# Patient Record
Sex: Female | Born: 1950
Health system: Southern US, Community
[De-identification: ages and names within clinical notes are randomized; demographics above are authoritative.]

## PROBLEM LIST (undated history)

## (undated) DIAGNOSIS — I219 Acute myocardial infarction, unspecified: Secondary | ICD-10-CM

## (undated) DIAGNOSIS — I82409 Acute embolism and thrombosis of unspecified deep veins of unspecified lower extremity: Secondary | ICD-10-CM

## (undated) DIAGNOSIS — I509 Heart failure, unspecified: Secondary | ICD-10-CM

## (undated) DIAGNOSIS — I10 Essential (primary) hypertension: Secondary | ICD-10-CM

## (undated) DIAGNOSIS — I4891 Unspecified atrial fibrillation: Secondary | ICD-10-CM

## (undated) HISTORY — DX: Acute embolism and thrombosis of unspecified deep veins of unspecified lower extremity: I82.409

## (undated) HISTORY — PX: CORONARY STENT PLACEMENT: SHX1402

---

## 2011-03-03 ENCOUNTER — Inpatient Hospital Stay: Payer: Self-pay | Admitting: Orthopedic Surgery

## 2011-03-03 IMAGING — CR RIGHT HIP - 1 VIEW
1 series · 1 of 1 positions shown · non-contrast
Comparison: none

REASON FOR EXAM: Evaluate for fracture post-reduction in PACU
COMMENTS:   Bedside (portable):Y

PROCEDURE:     DXR - DXR HIP RIGHT ONE VIEW  - [DATE]  [DATE]
RESULT:     Degenerative changes are noted about the right hip. Fracture of
the superior acetabulum appears to be present. Mild displacement noted.
Loose bodies cannot be excluded.

[view not recorded]
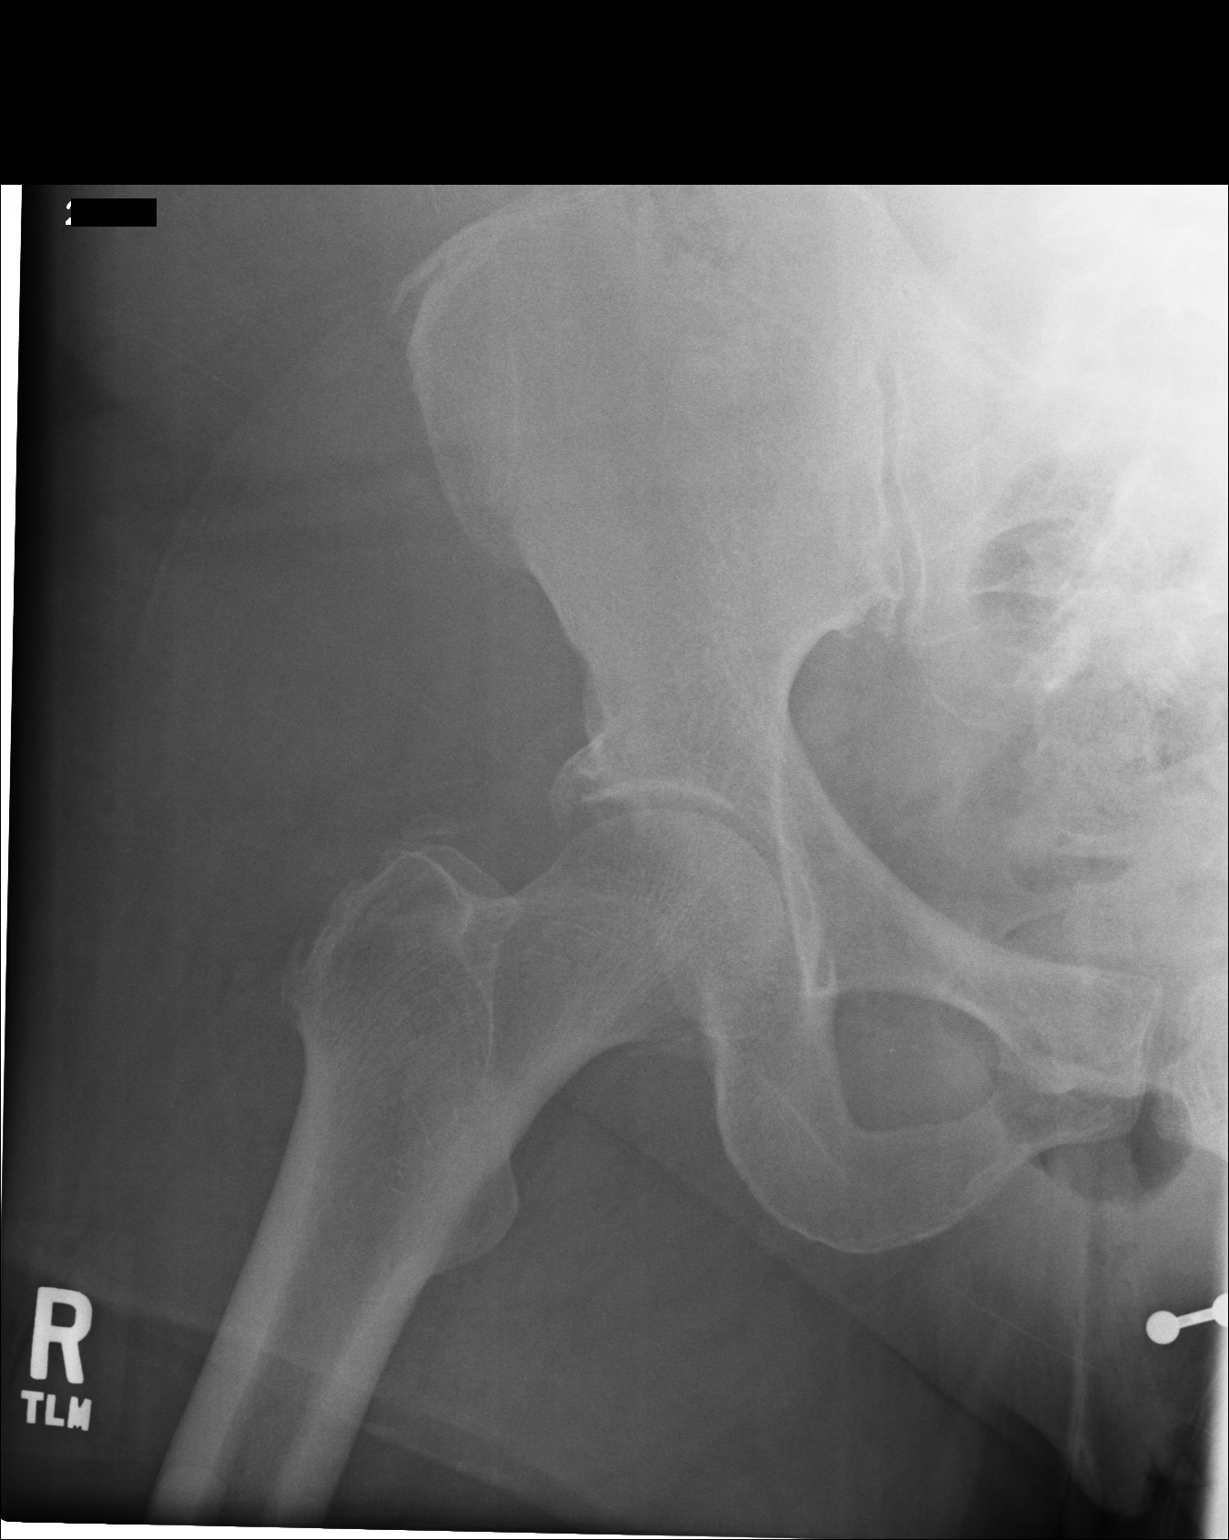

[1 of 1 positions shown; findings below may reference images not displayed]

IMPRESSION: Relocated right hip in AP projection. Minimally displaced
fracture of the superior acetabulum appears to be present. Loose bodies may
be present.

## 2011-03-03 IMAGING — CT CT ABDOMEN W/O CM
1 of 2 series · 15 of 32 positions shown, 20 images · non-contrast
Comparison: none

REASON FOR EXAM: evaluate abdominal pain in the setting of trauma
COMMENTS:

PROCEDURE:     CT  - CT ABDOMEN STANDARD WO  - [DATE]  [DATE]
RESULT:     History: Trauma.

[Series 2: 3mm soft tissue · axial · 0.88mm/px · z∈[-582,-304]mm · 15 of 105 slices shown, 20 images]
[im 6/105  soft-tissue]
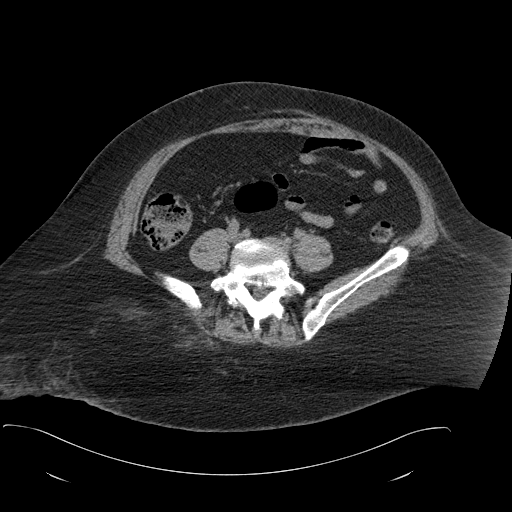
[im 6/105  bone]
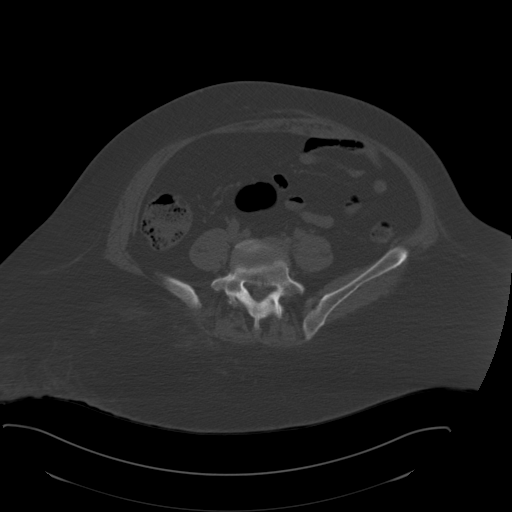
[im 11/105  soft-tissue]
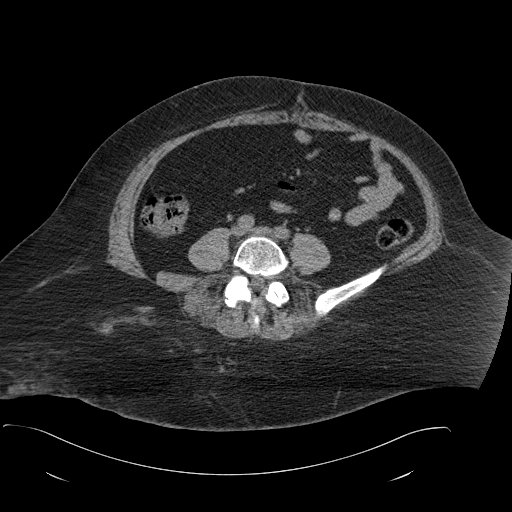
[im 22/105  soft-tissue]
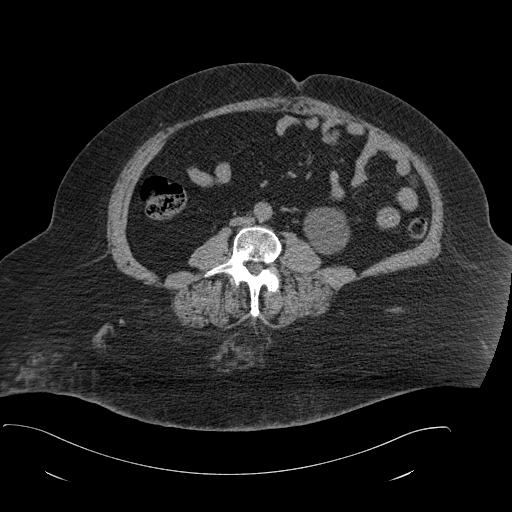
[im 28/105  soft-tissue]
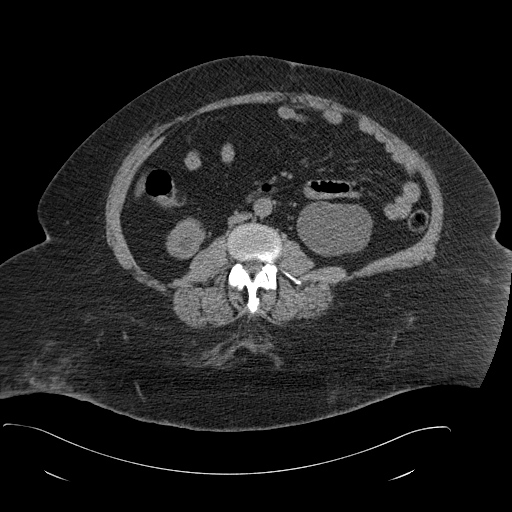
[im 33/105  soft-tissue]
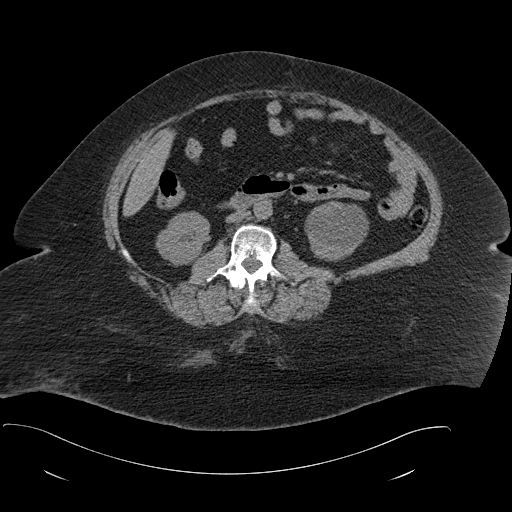
[im 44/105  soft-tissue]
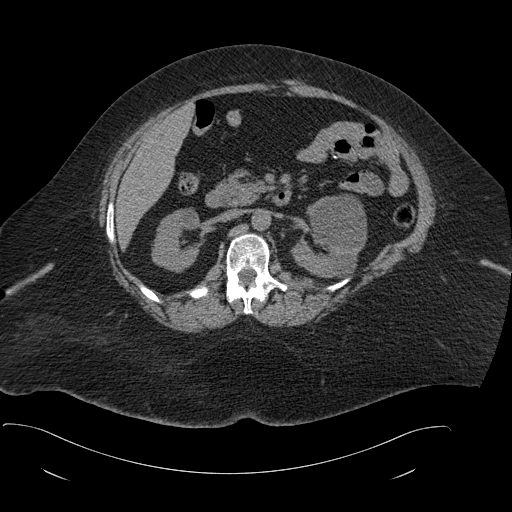
[im 50/105  soft-tissue]
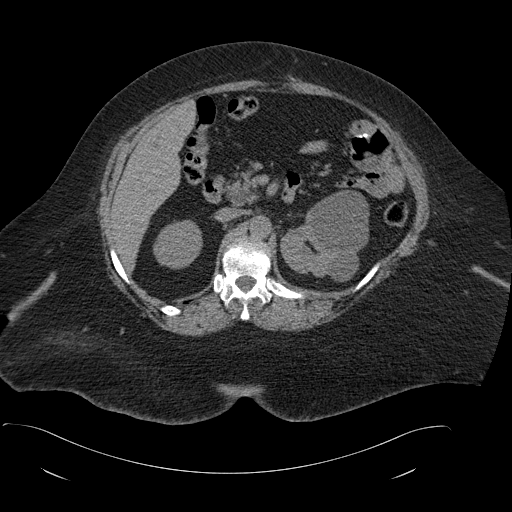
[im 55/105  soft-tissue]
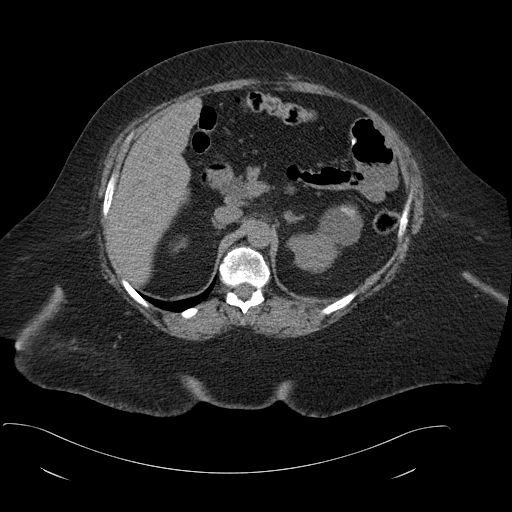
[im 61/105  soft-tissue]
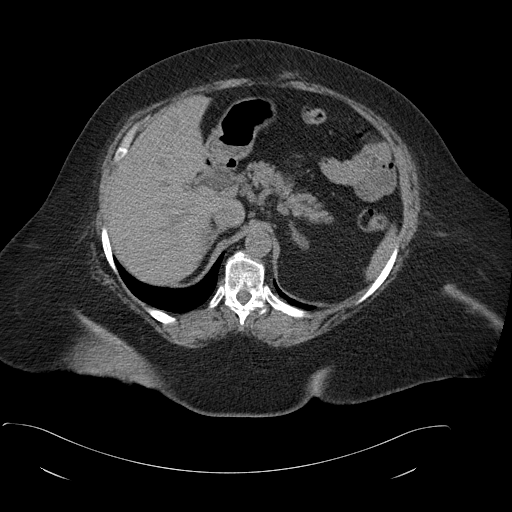
[im 61/105  bone]
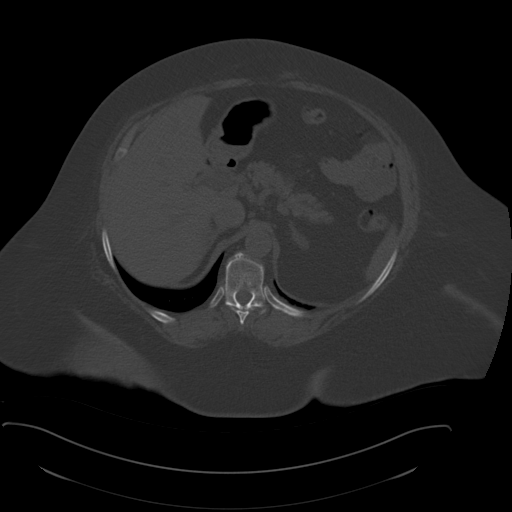
[im 72/105  soft-tissue]
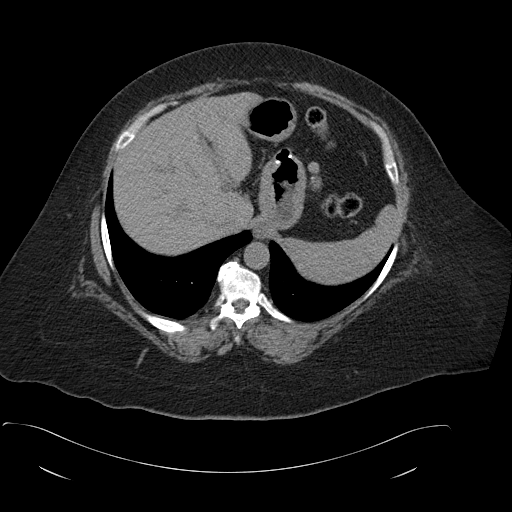
[im 77/105  soft-tissue]
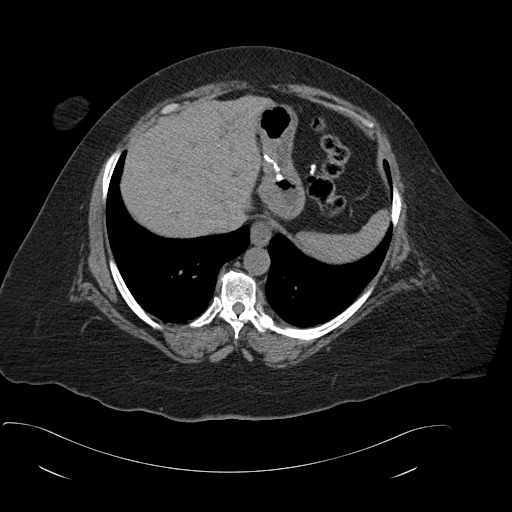
[im 83/105  soft-tissue]
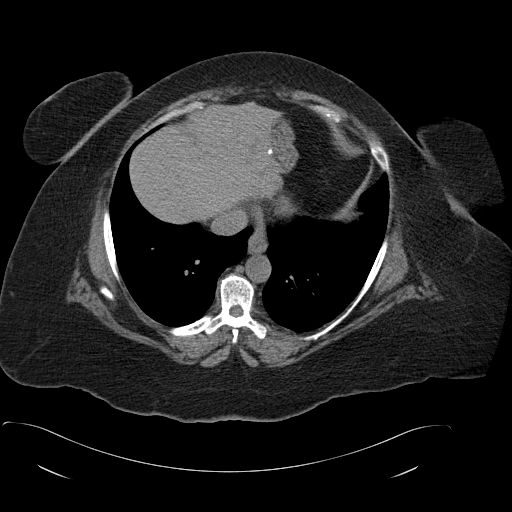
[im 83/105  lung]
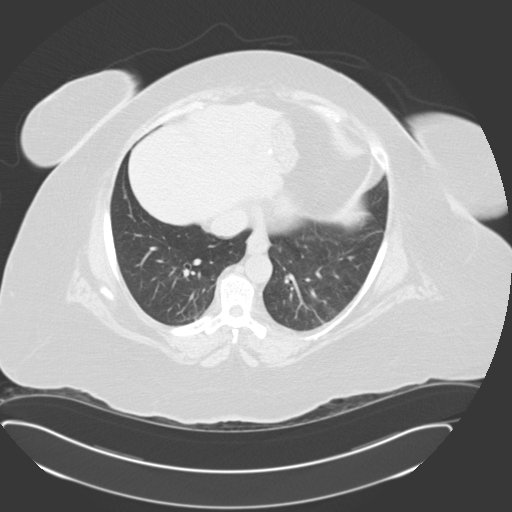
[im 88/105  lung]
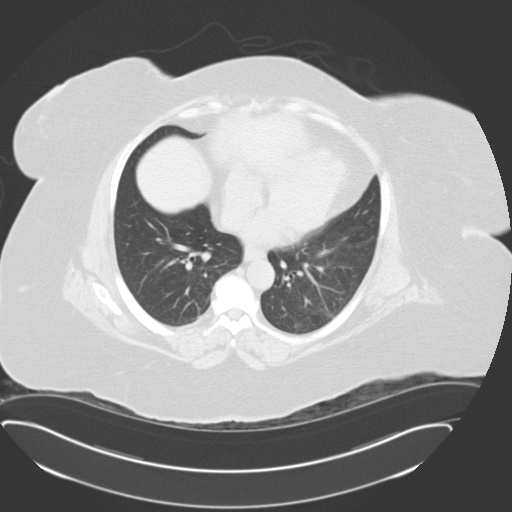
[im 94/105  soft-tissue]
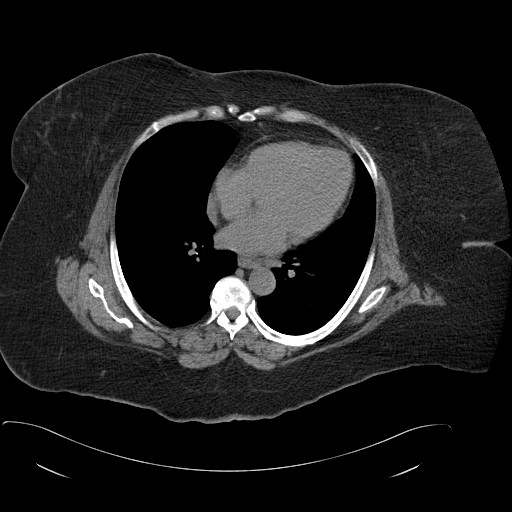
[im 94/105  lung]
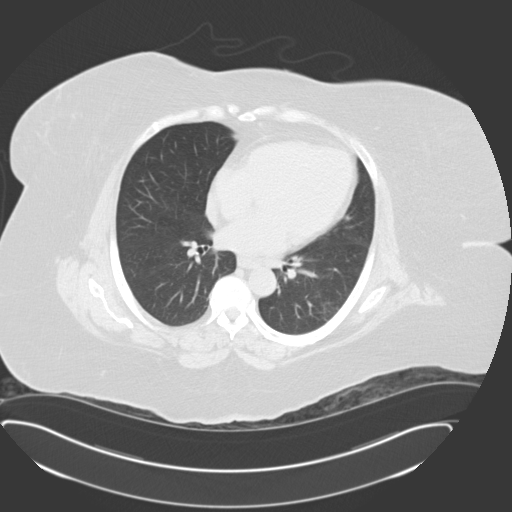
[im 99/105  soft-tissue]
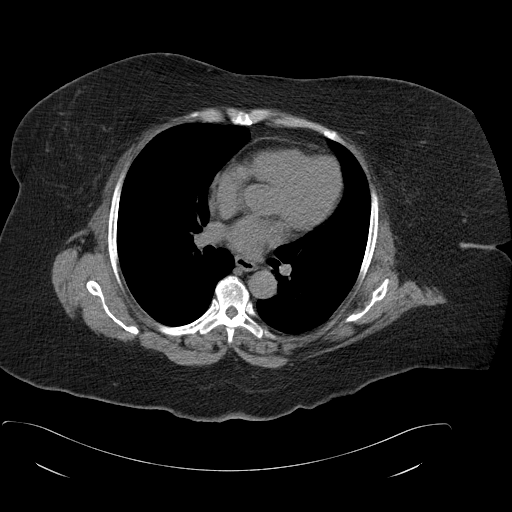
[im 99/105  lung]
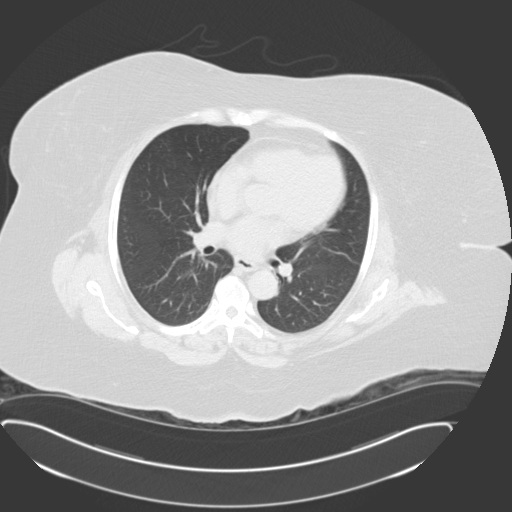

[15 of 32 positions shown; findings below may reference images not displayed]

FINDINGS: Standard nonenhanced CT obtained. Liver normal. Spleen normal.
Pancreas normal. Adrenals normal. Aorta nondistended. Multiple renal cyst
present. Soft tissue swelling noted of the right buttock region. Lung bases
clear.
IMPRESSION: Soft tissue swelling noted over the right buttock region.
No acute abnormality otherwise noted.

## 2011-03-03 IMAGING — CR PELVIS - 1-2 VIEW
1 series · 1 of 1 positions shown · non-contrast
Comparison: none

REASON FOR EXAM: fall injury
COMMENTS:

[t pelvis ap]
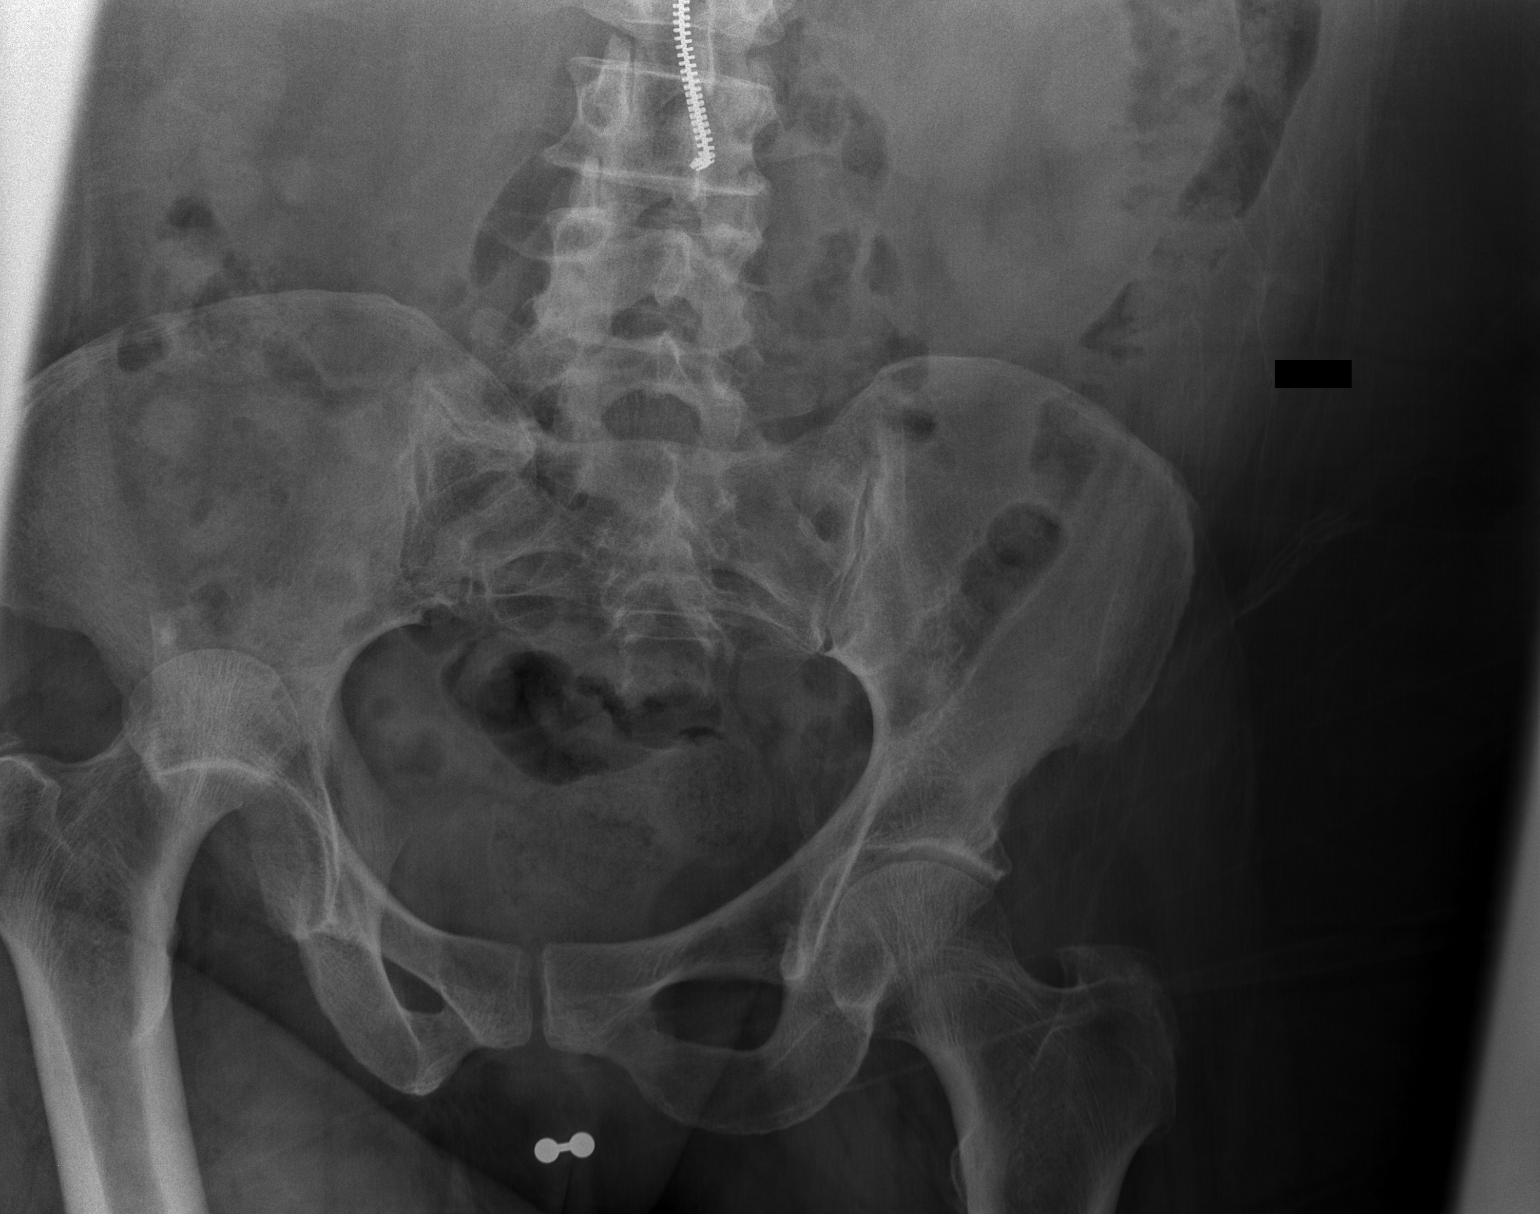

[1 of 1 positions shown; findings below may reference images not displayed]

PROCEDURE:     DXR - DXR PELVIS AP ONLY  - [DATE]  [DATE]

RESULT:     Right femur is dislocated from the acetabulum. Underlying
fracture is not evident but certainly consideration for acetabular fracture
would be recommended. The bony structures otherwise appear to be
unremarkable. The sacral arches appear intact. Postreduction CT may be
beneficial.
IMPRESSION: Dislocation of the right hip.

## 2011-03-03 IMAGING — CT CT PELVIS W/O CM
1 series · 16 of 32 positions shown, 20 images · non-contrast
Comparison: none

REASON FOR EXAM: fall hip dislocation pelvic pain
COMMENTS:

PROCEDURE:     CT  - CT PELVIS STANDARD WO  - [DATE]  [DATE]
RESULT:     History: Fall
Study: Right femur series

[Series 2: bone windows · axial · 0.72mm/px · z∈[-1092,-874]mm · 16 of 82 slices shown, 20 images]
[im 6/82  soft-tissue]
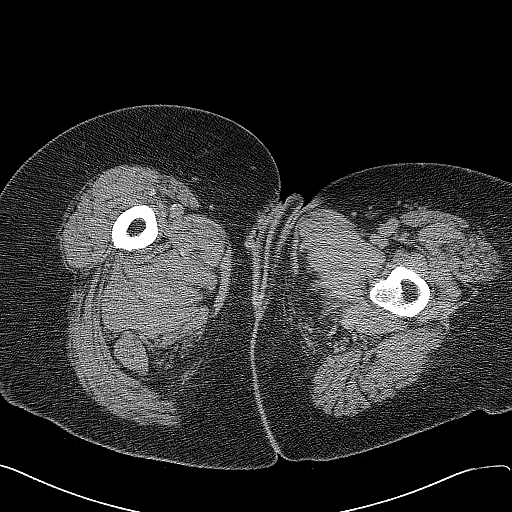
[im 6/82  bone]
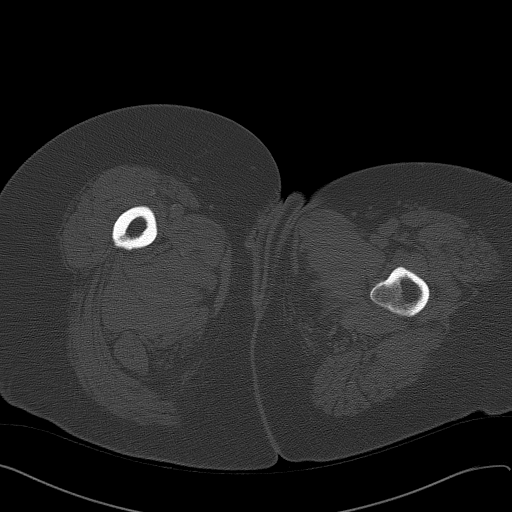
[im 11/82  soft-tissue]
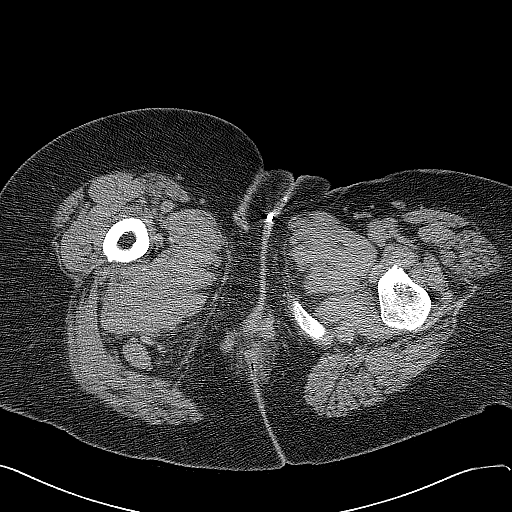
[im 16/82  soft-tissue]
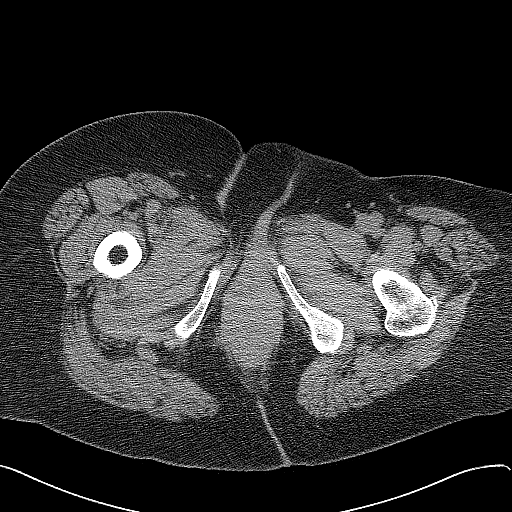
[im 21/82  soft-tissue]
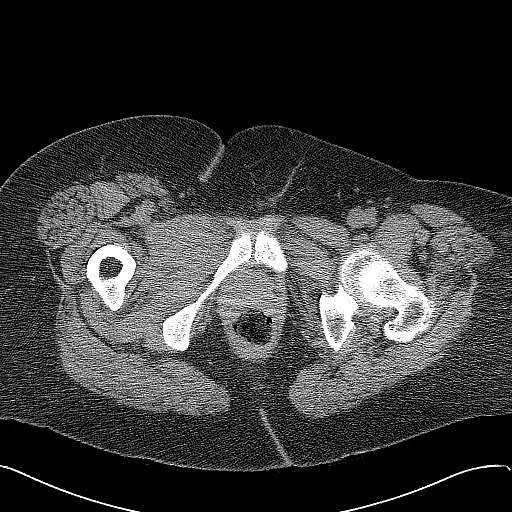
[im 27/82  soft-tissue]
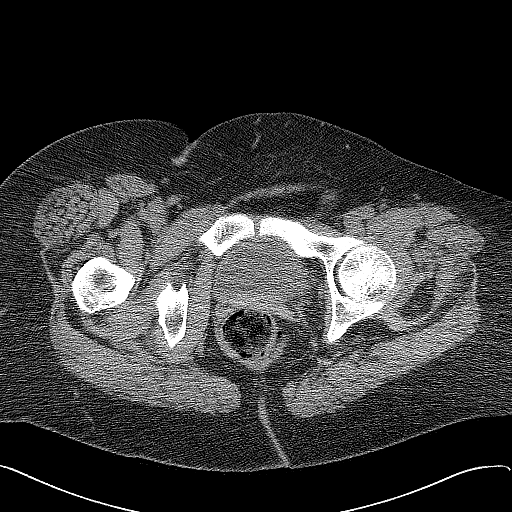
[im 32/82  soft-tissue]
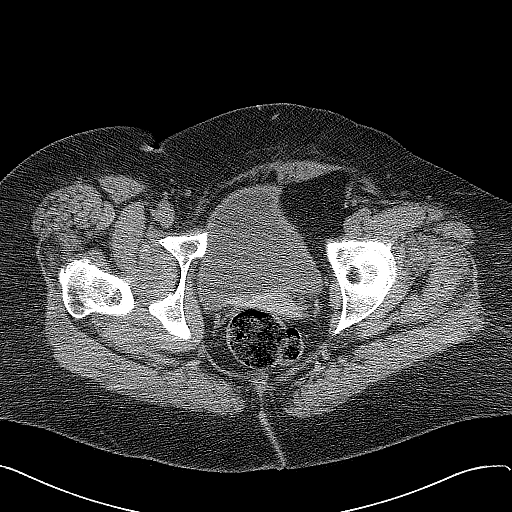
[im 37/82  soft-tissue]
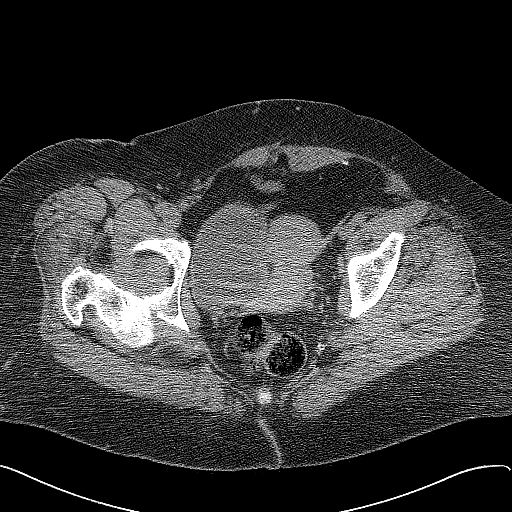
[im 45/82  soft-tissue]
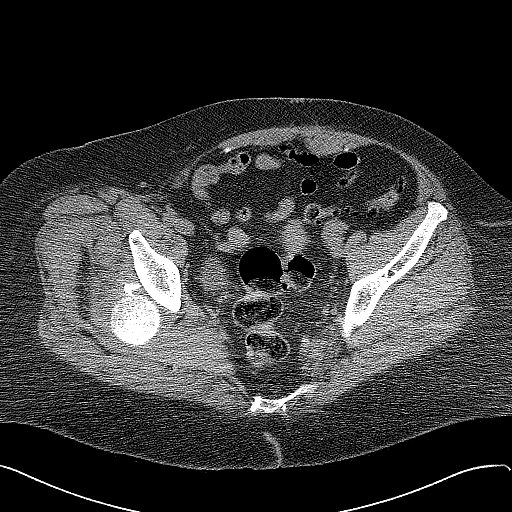
[im 50/82  soft-tissue]
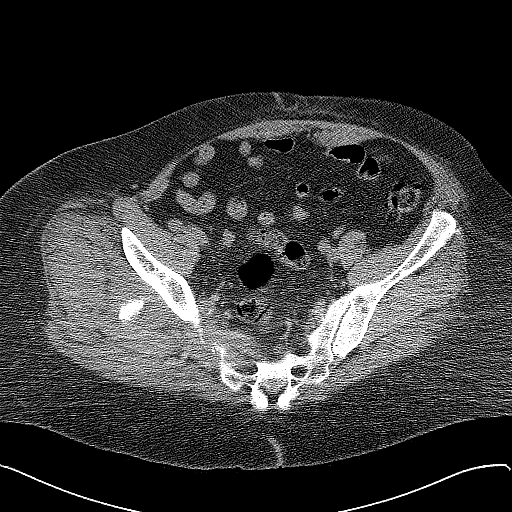
[im 50/82  bone]
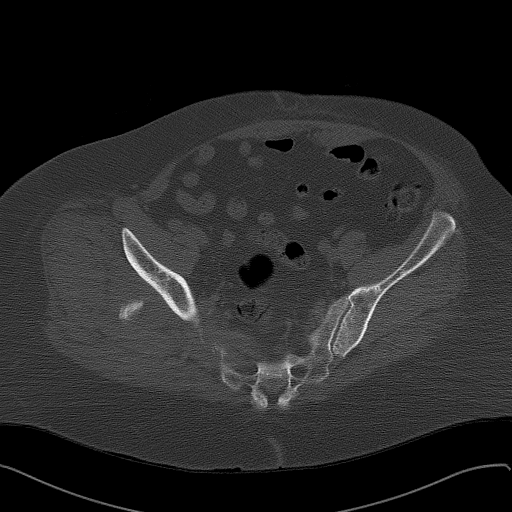
[im 55/82  soft-tissue]
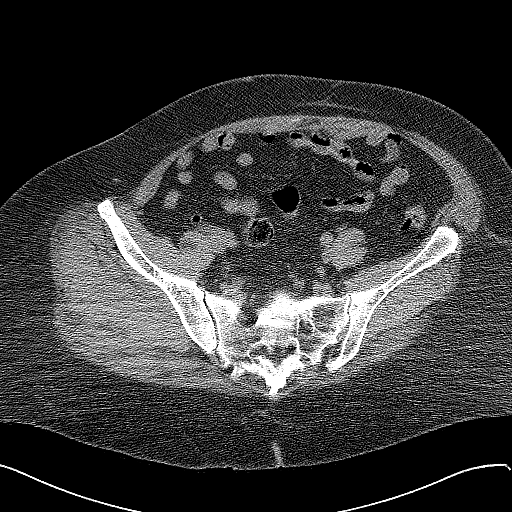
[im 61/82  soft-tissue]
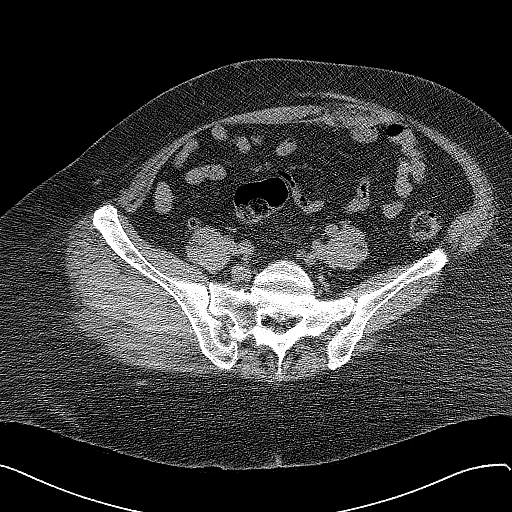
[im 66/82  soft-tissue]
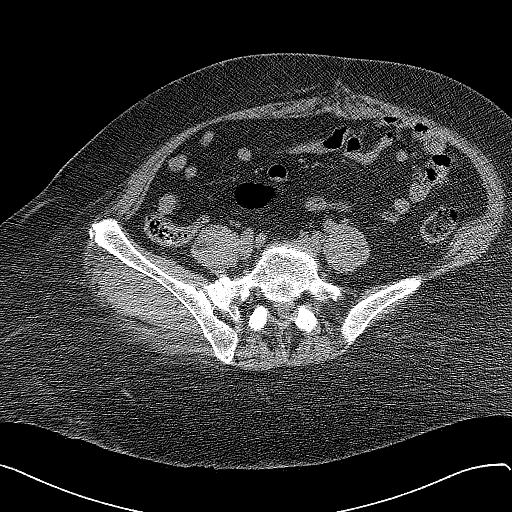
[im 71/82  soft-tissue]
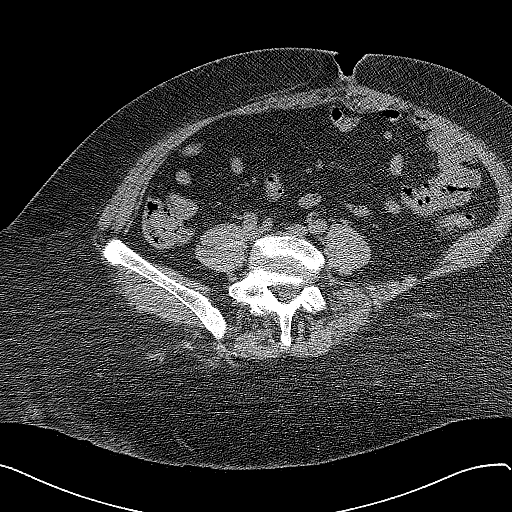
[im 71/82  lung]
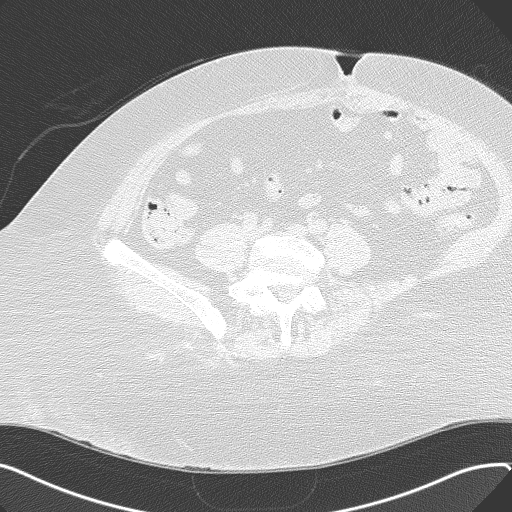
[im 74/82  lung]
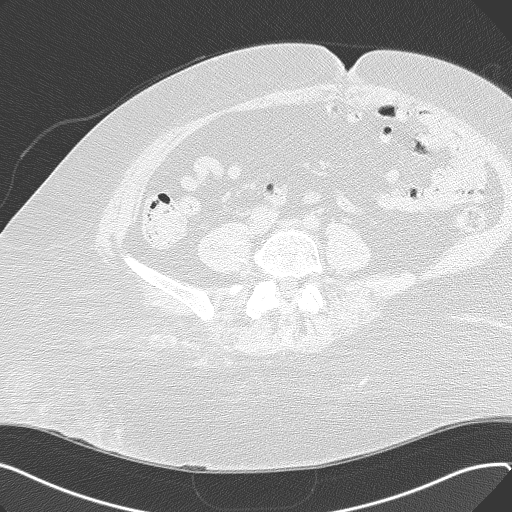
[im 76/82  soft-tissue]
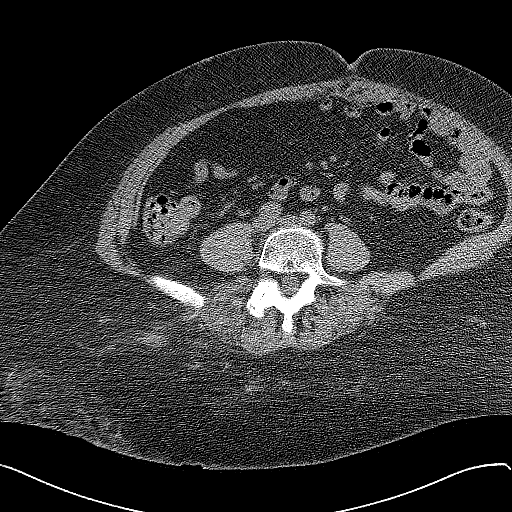
[im 76/82  lung]
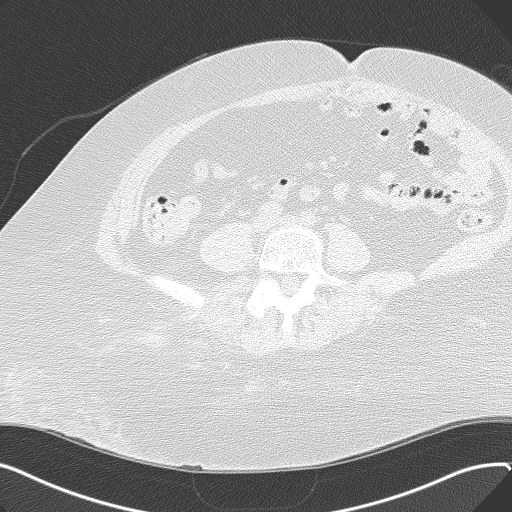
[im 79/82  lung]
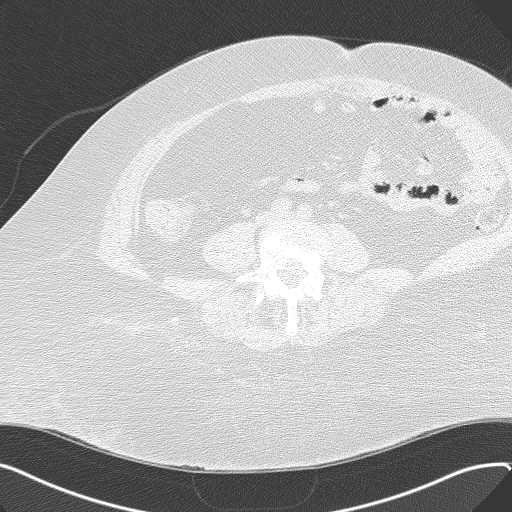

[16 of 32 positions shown; findings below may reference images not displayed]

FINDINGS: There is a posterior superior dislocation of the right hip with
associated fracture of the superior acetabulum. The fracture severely
displaced. Remainder the pelvis is intact. Mild right femoral head deformity
noted. Loose bodies within the right hip joint space present
IMPRESSION: Posterior superior right hip dislocation with associated
displaced fracture of the superior acetabulum. Slight deformity noted of the
medial femoral head. Loose bodies noted in the hip joint space.

## 2011-03-03 IMAGING — CR DG C-ARM 1-60 MIN
1 series · 2 of 2 positions shown · non-contrast
Comparison: none

REASON FOR EXAM: closed reduction
COMMENTS:

PROCEDURE:     DXR - DXR C-ARM WITH 2 VIEWS RT HIP  - [DATE]  [DATE]
RESULT:     Fluoroscopic spot films reveal the right hip to be well located.
Fracture of the superior acetabular lip appears to be present. This is
displaced superiorly. Loose bodies within the joint space may be present.

[Series 6001: rt hip · 2 of 2 slices shown]
[im 1/2]
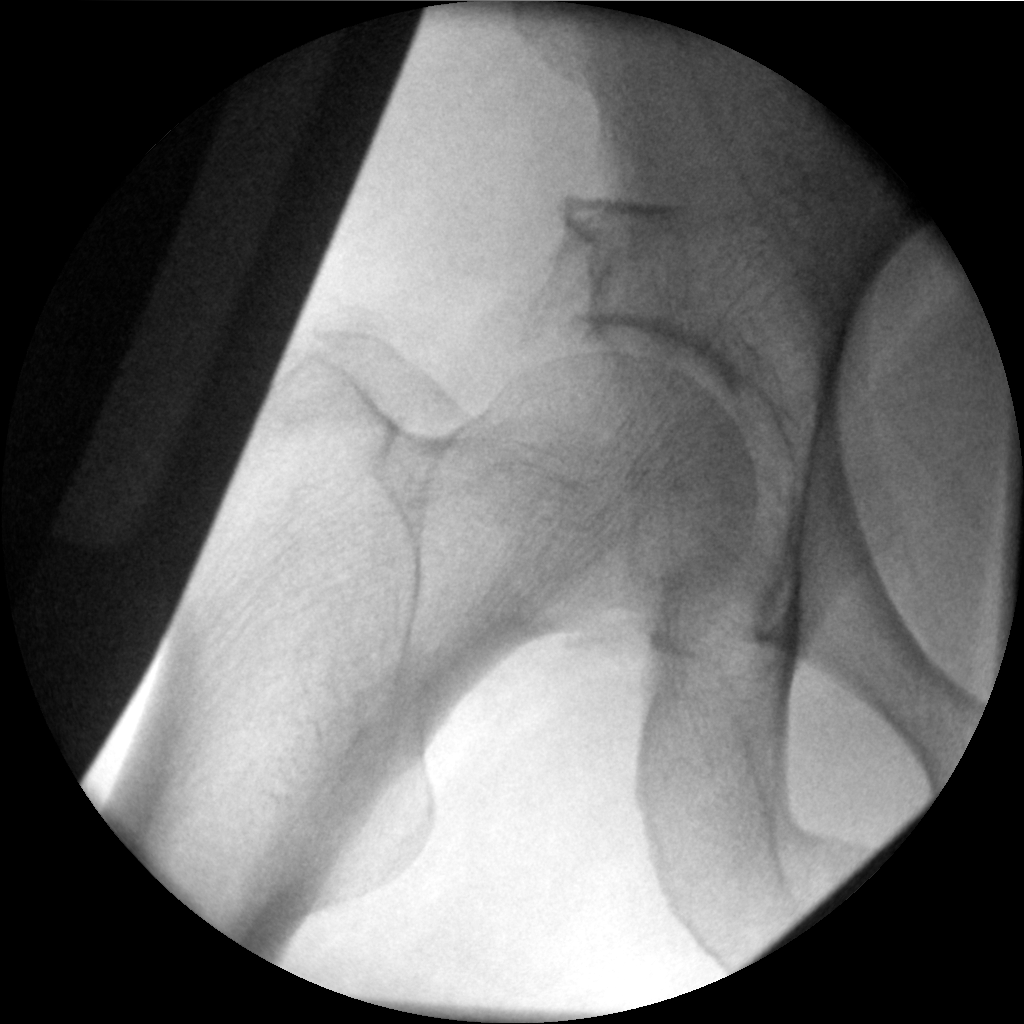
[im 2/2]
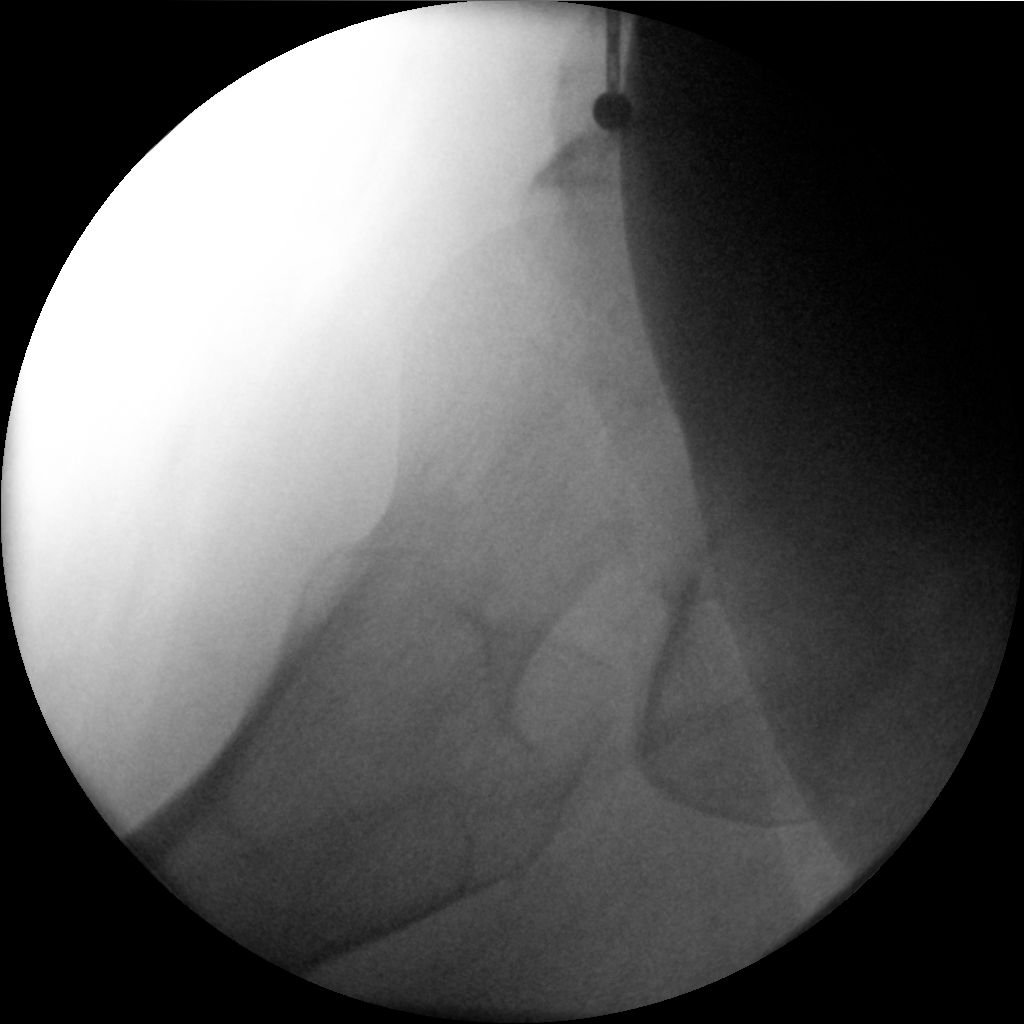

[2 of 2 positions shown; findings below may reference images not displayed]

IMPRESSION: Relocation of right hip. Superior acetabular fracture.
Loose bodies.

## 2011-03-03 IMAGING — CT CT CERVICAL SPINE WITHOUT CONTRAST
1 series · 12 of 14 positions shown, 15 images · non-contrast
Comparison: none

REASON FOR EXAM: fall neck pain
COMMENTS:

PROCEDURE:     CT  - CT CERVICAL SPINE WO  - [DATE]  [DATE]
RESULT:     Comparison: None.
TECHNIQUE: Multiple axial CT images were obtained of the cervical spine,
without intravenous contrast.  Sagittal and coronal reformatted images were
constructed.

[Series 4: axial · axial · 0.33mm/px · z∈[-213,-71]mm · 12 of 86 slices shown, 15 images]
[im 7/86  soft-tissue]
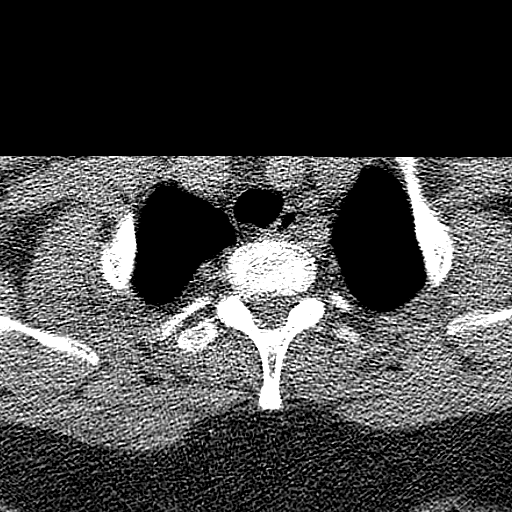
[im 7/86  bone]
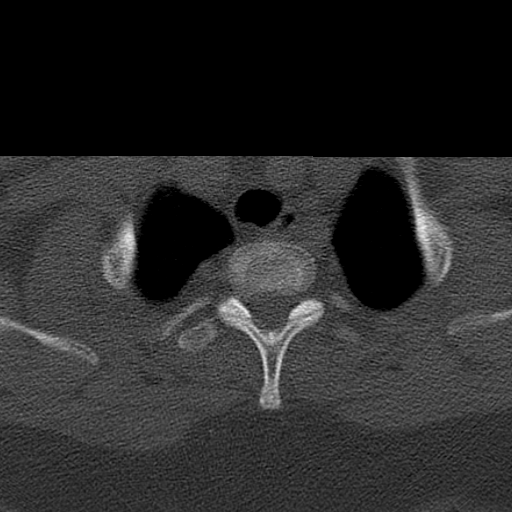
[im 14/86  bone]
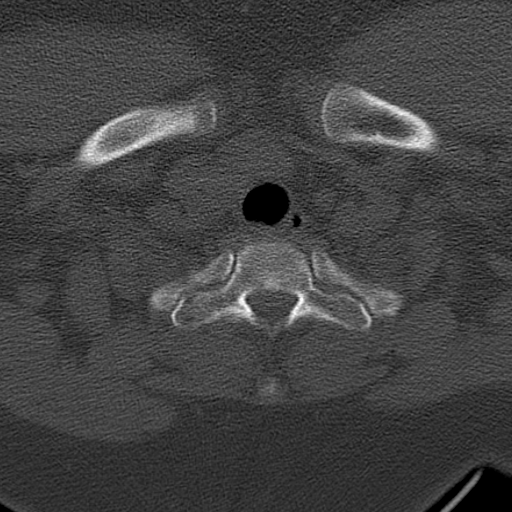
[im 20/86  bone]
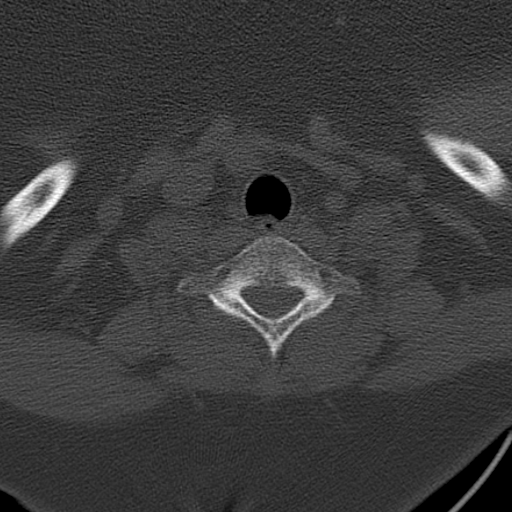
[im 27/86  bone]
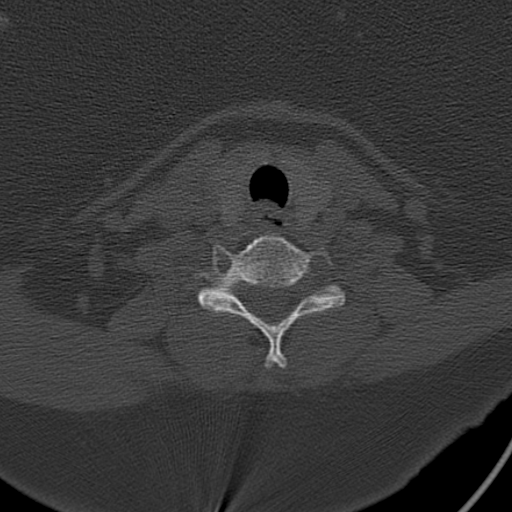
[im 33/86  soft-tissue]
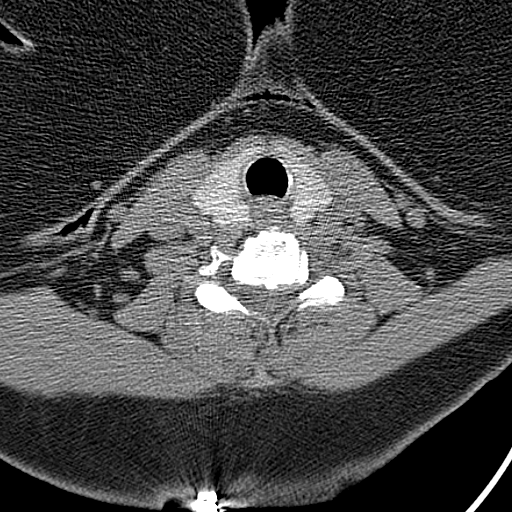
[im 33/86  bone]
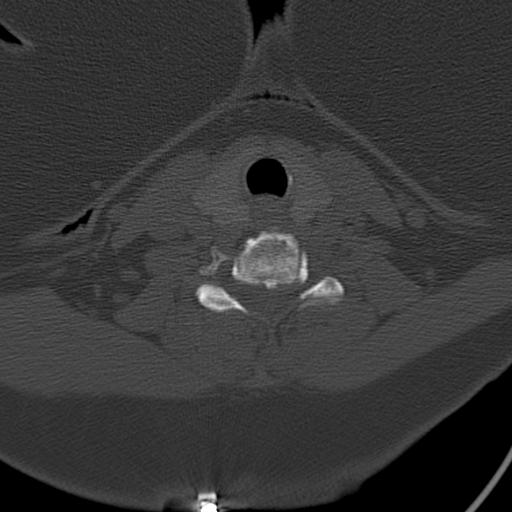
[im 40/86  bone]
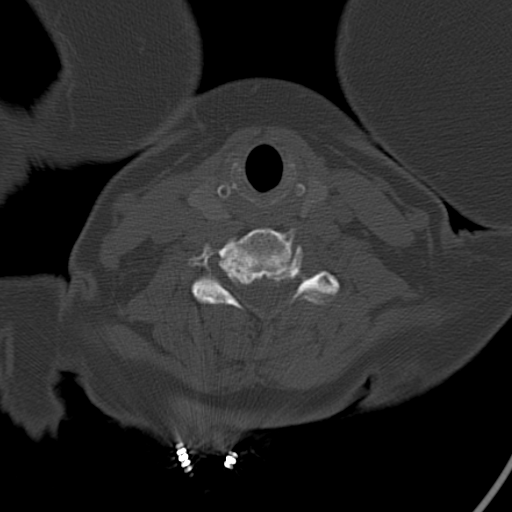
[im 46/86  bone]
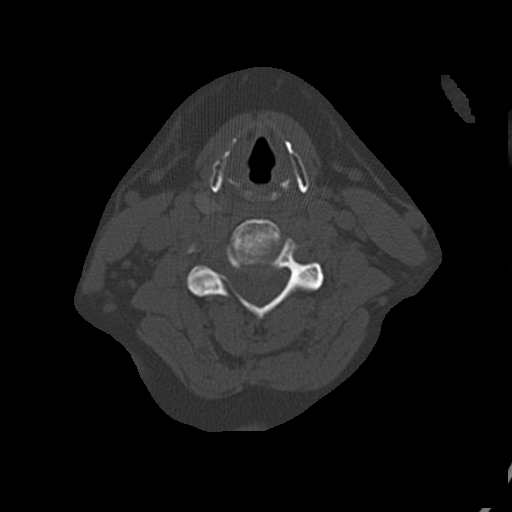
[im 53/86  bone]
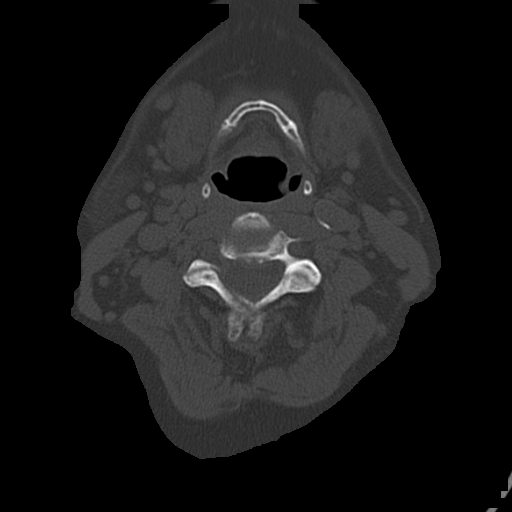
[im 59/86  soft-tissue]
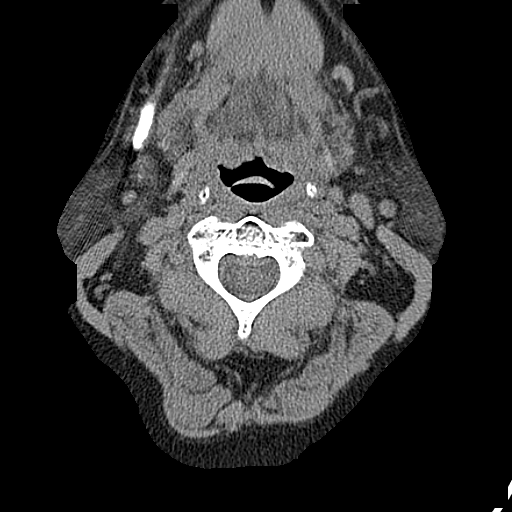
[im 59/86  bone]
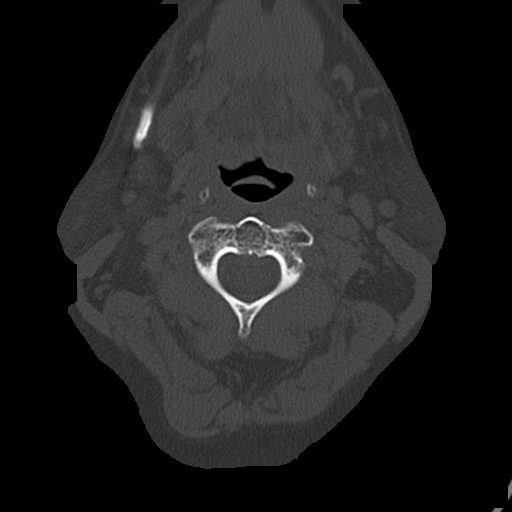
[im 66/86  bone]
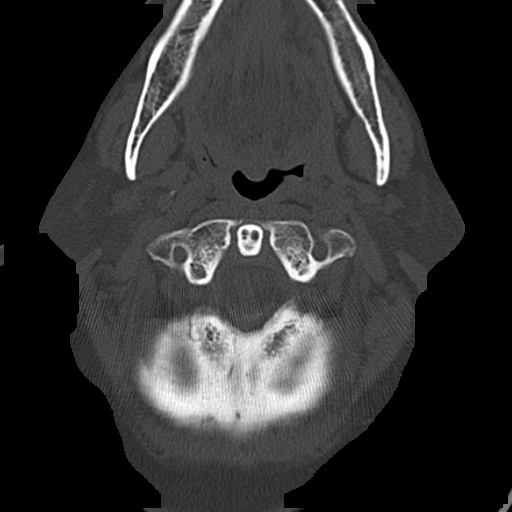
[im 72/86  bone]
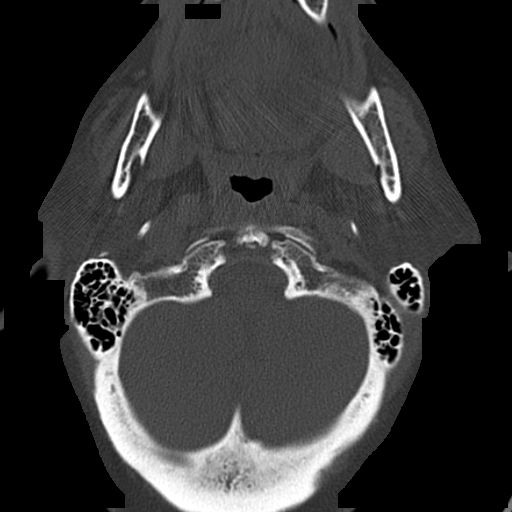
[im 79/86  bone]
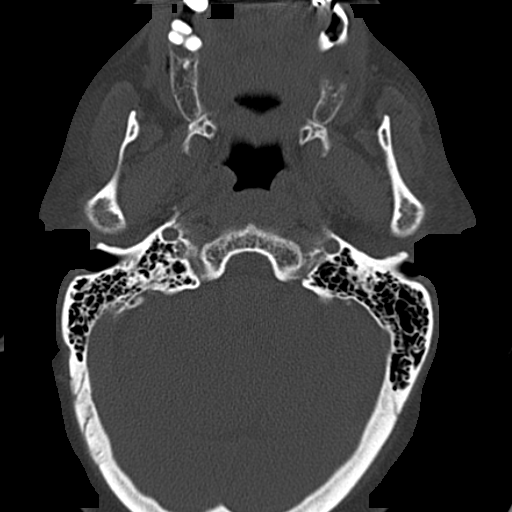

[12 of 14 positions shown; findings below may reference images not displayed]

FINDINGS: No evidence of cervical spine fracture or static listhesis.  Vertebral body
heights are maintained.  Prevertebral soft tissues are within normal limits.
There is mild reversal of the normal cervical lordosis, which is
nonspecific. There is mild multilevel degenerative disc disease.
IMPRESSION: No cervical spine fracture or static listhesis.  Ligamentous injury cannot
be excluded.

## 2011-03-03 IMAGING — CR DG FEMUR 2V*R*
1 series · 6 of 6 positions shown · non-contrast
Comparison: none

REASON FOR EXAM: fall injury
COMMENTS:

PROCEDURE:     DXR - DXR FEMUR RIGHT  - [DATE]  [DATE]
RESULT:     There is dislocation of the femoral head and acetabulum.
Postreduction images are recommended. CT may be beneficial to evaluate for
acetabular fracture

[Series 1: t femur proximal ap right · 0.14mm/px · 6 of 6 slices shown]
[im 1/6]
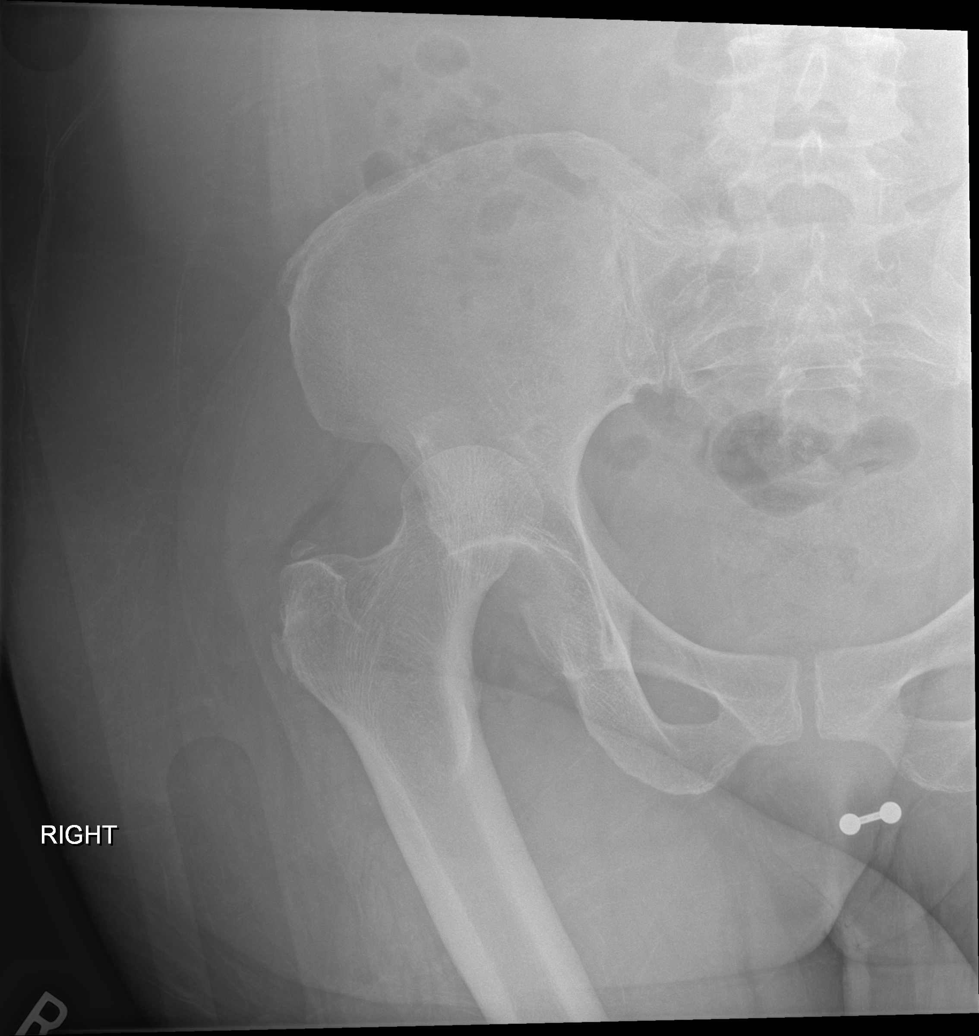
[im 2/6]
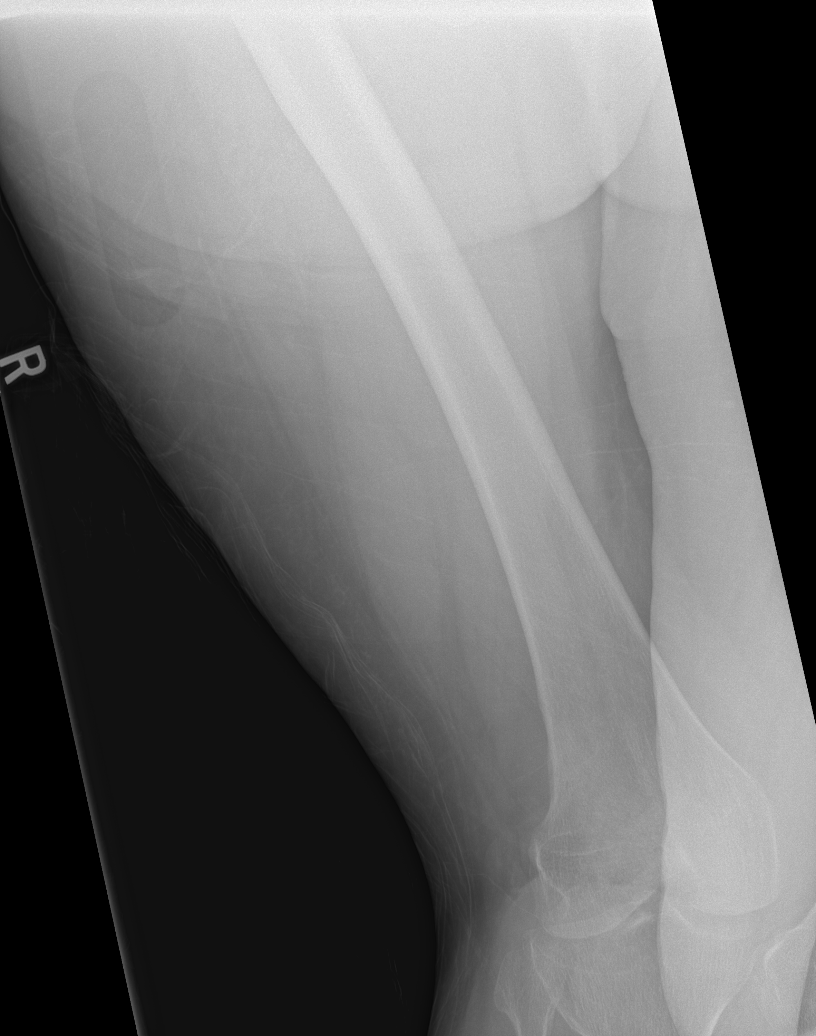
[im 3/6]
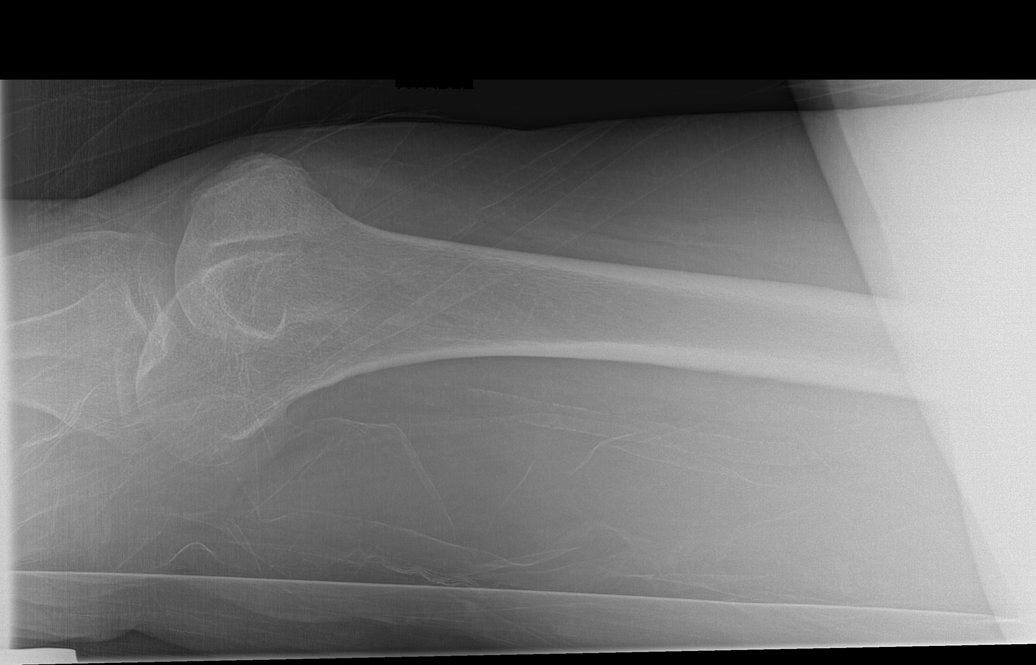
[im 4/6]
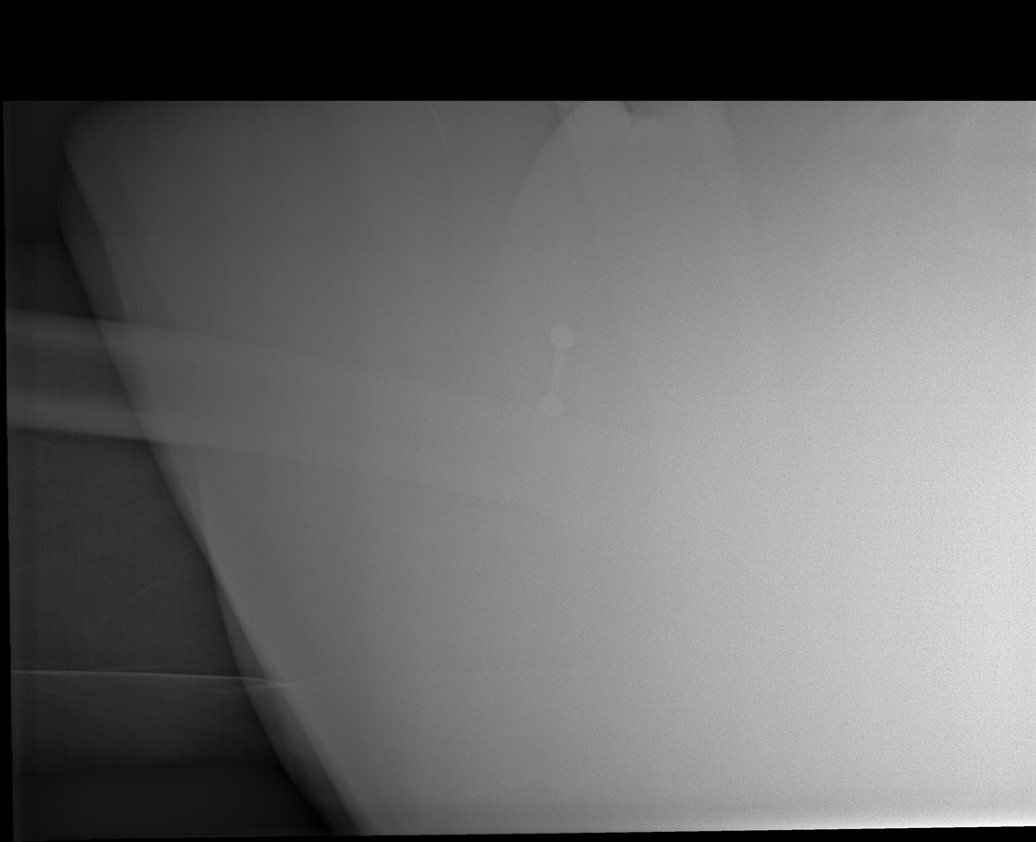
[im 5/6]
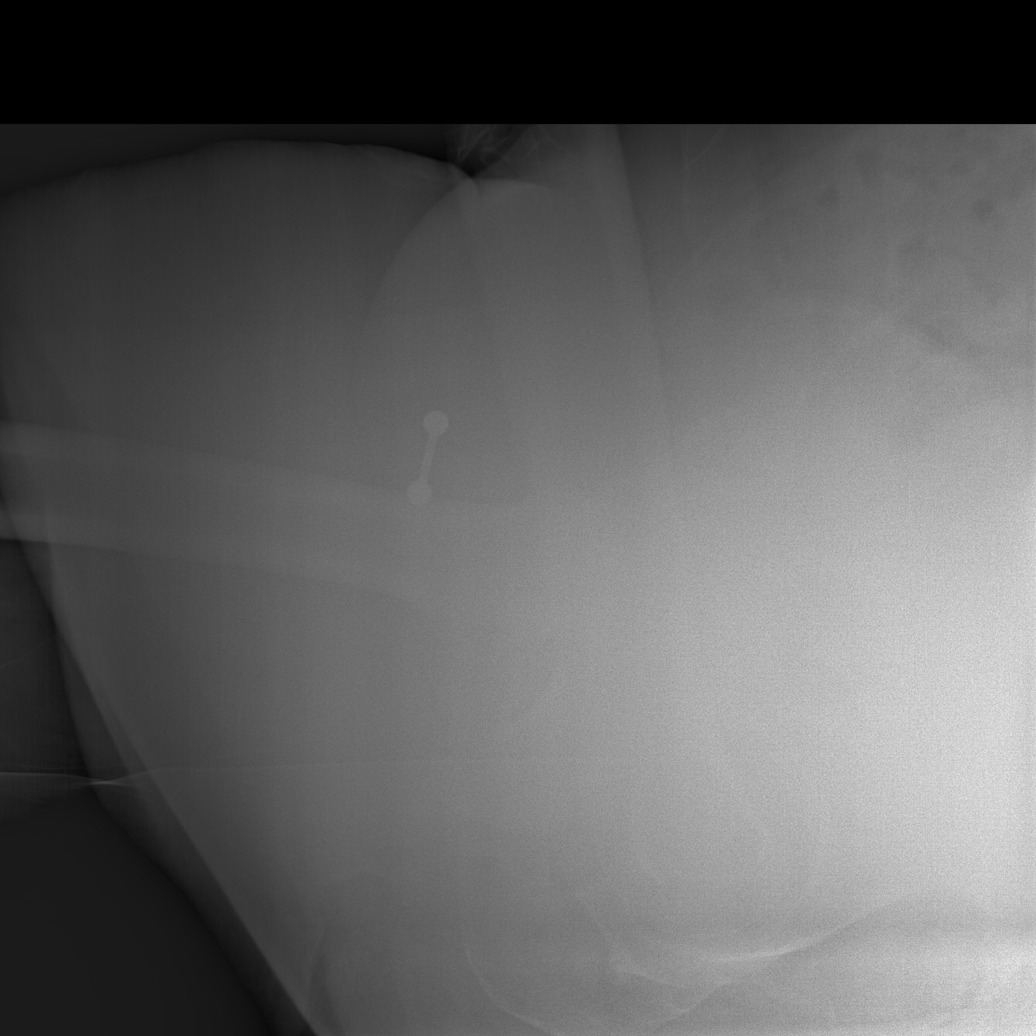
[im 6/6]
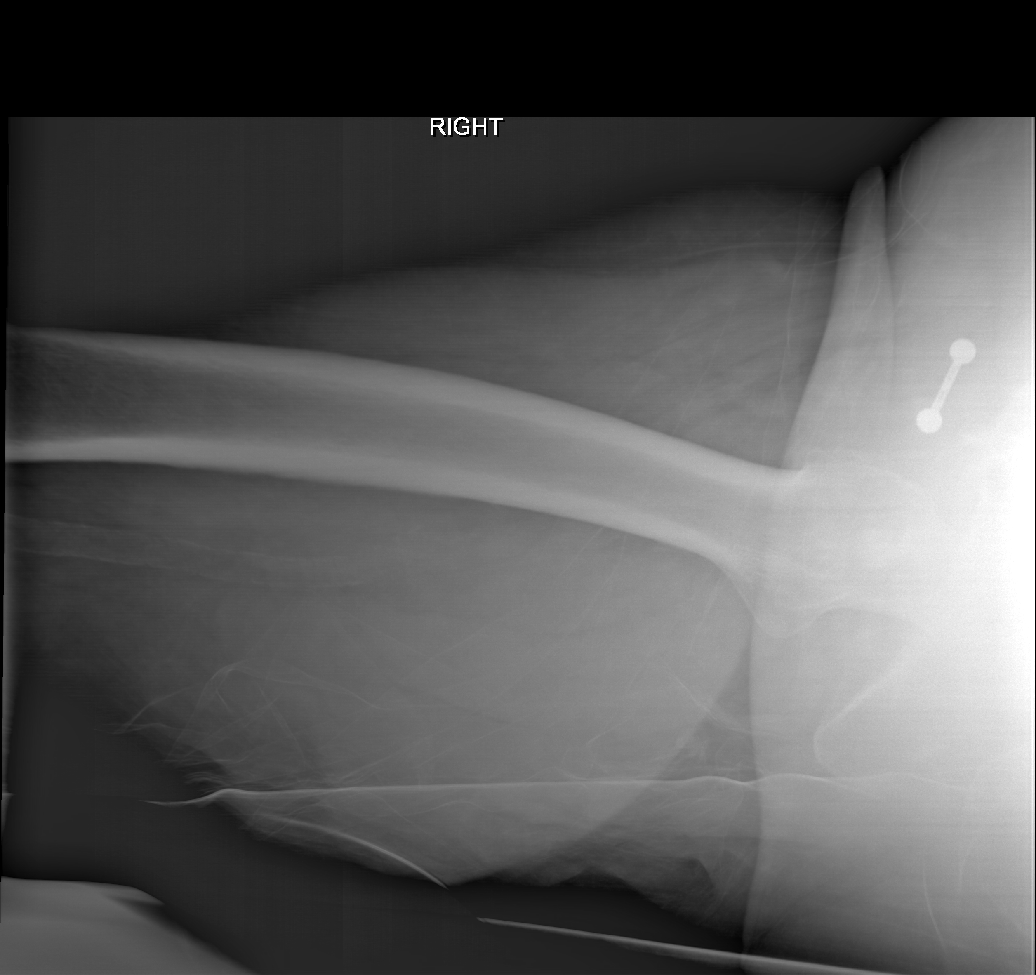

[6 of 6 positions shown; findings below may reference images not displayed]

IMPRESSION: Dislocation of the femoral head. Findings concerning for
acetabular fracture. Post reduction CT is suggested.

## 2011-03-03 IMAGING — CR DG CHEST 1V PORT
1 series · 1 of 1 positions shown · non-contrast
Comparison: none

REASON FOR EXAM: Fall
COMMENTS:

PROCEDURE:     DXR - DXR PORTABLE CHEST SINGLE VIEW  - [DATE]  [DATE]
RESULT:     The lungs are clear. Cardiomegaly is noted.

[view not recorded]
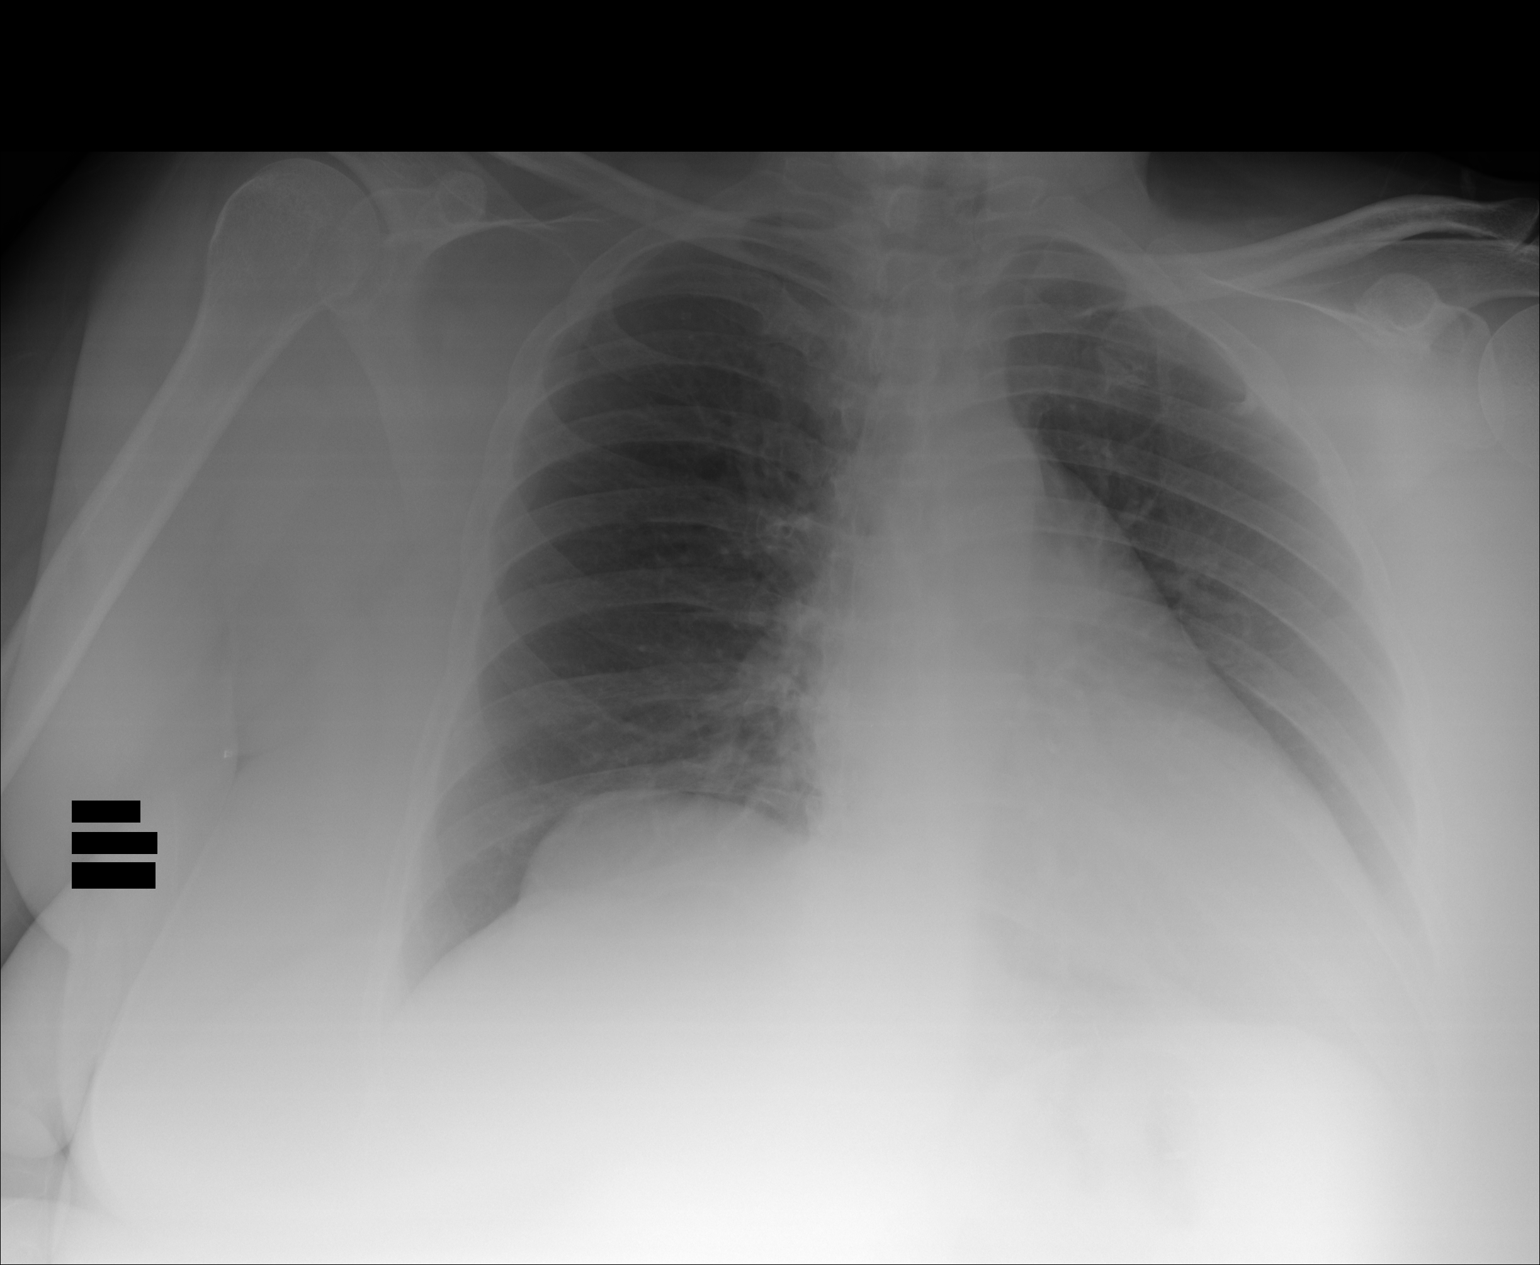

[1 of 1 positions shown; findings below may reference images not displayed]

IMPRESSION: Cardiomegaly. No congestive heart failure.

## 2011-03-04 IMAGING — CT CT OF THE RIGHT HIP WITHOUT CONTRAST
2 series · 15 of 36 positions shown, 20 images · non-contrast
Comparison: none

REASON FOR EXAM: Post-reduction looking for intra-articular bone debris
COMMENTS:

PROCEDURE:     CT  - CT HIP RIGHT WITHOUT CONTRAST  - [DATE]  [DATE]
RESULT:     History: Prior dislocation.
Comparison studies prior recent hip CT.

[Series 2: hip 3.0 b70s · axial · 0.38mm/px · z∈[-1038,-878]mm · 14 of 59 slices shown, 18 images]
[im 4/59  soft-tissue]
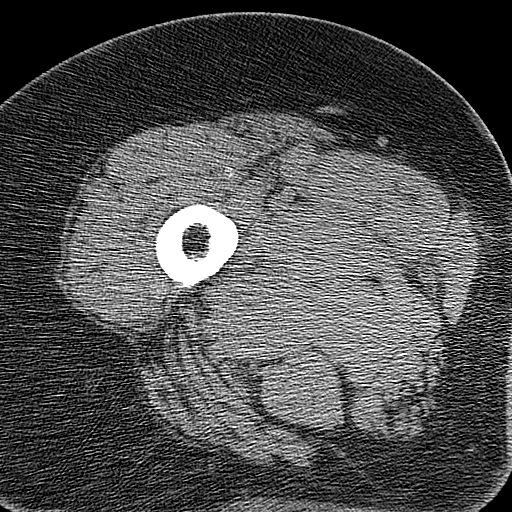
[im 4/59  bone]
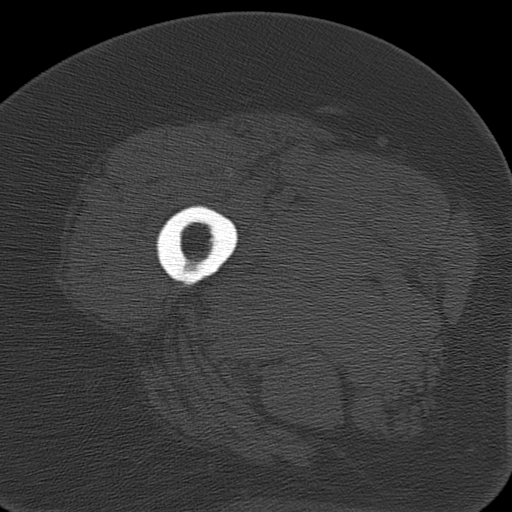
[im 8/59  soft-tissue]
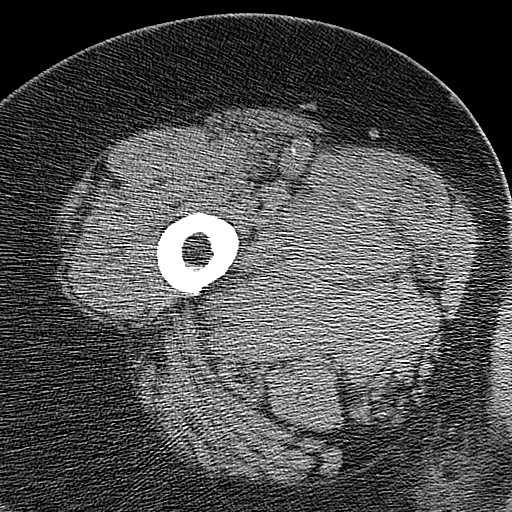
[im 14/59  soft-tissue]
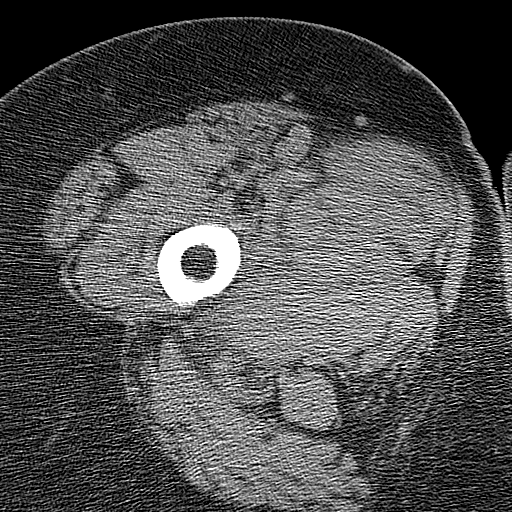
[im 17/59  soft-tissue]
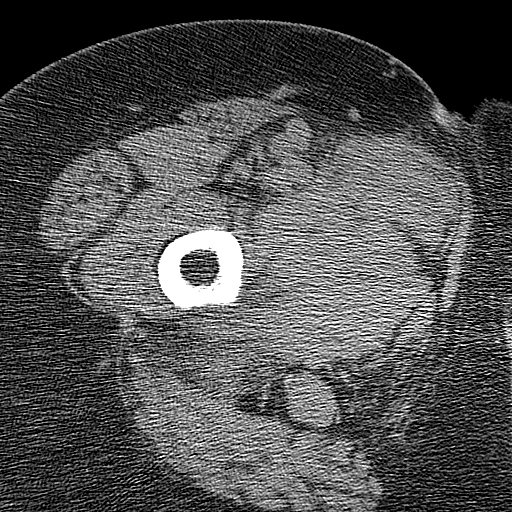
[im 23/59  soft-tissue]
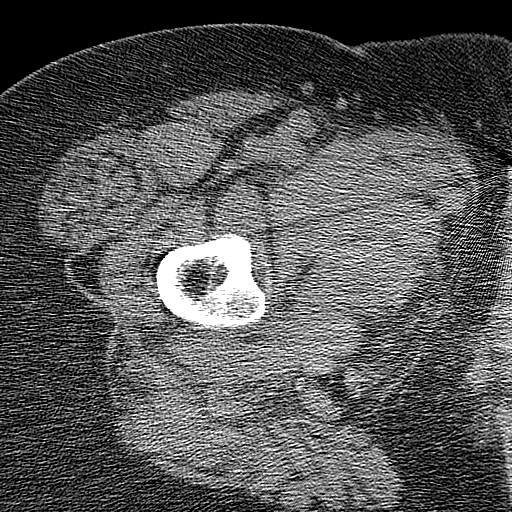
[im 27/59  soft-tissue]
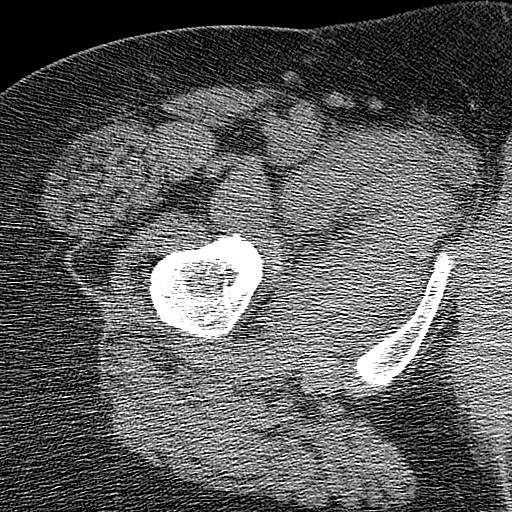
[im 32/59  soft-tissue]
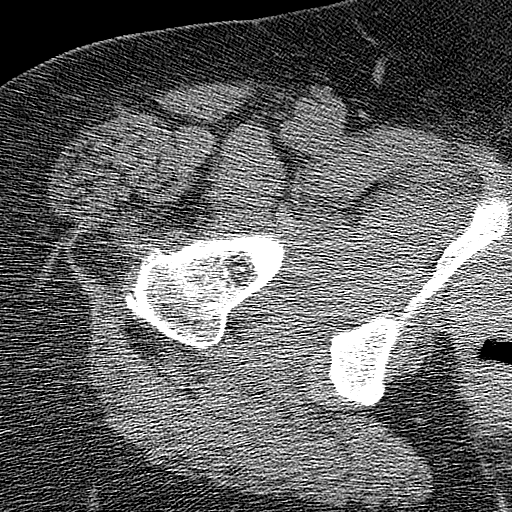
[im 36/59  soft-tissue]
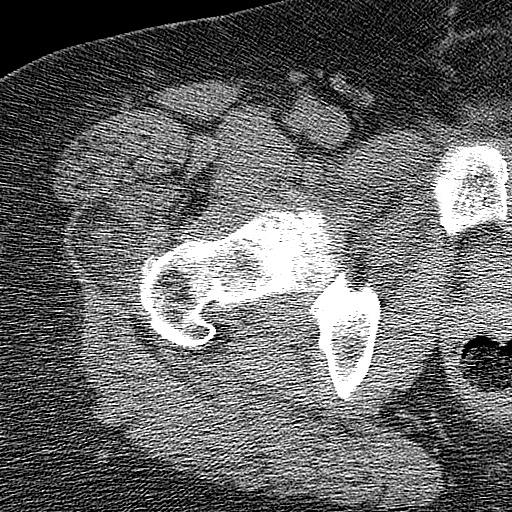
[im 42/59  soft-tissue]
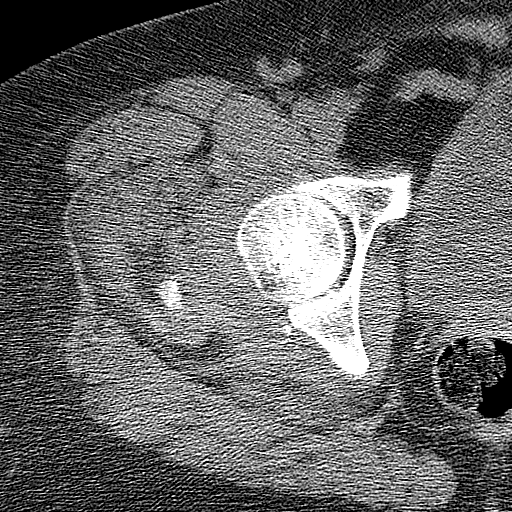
[im 42/59  bone]
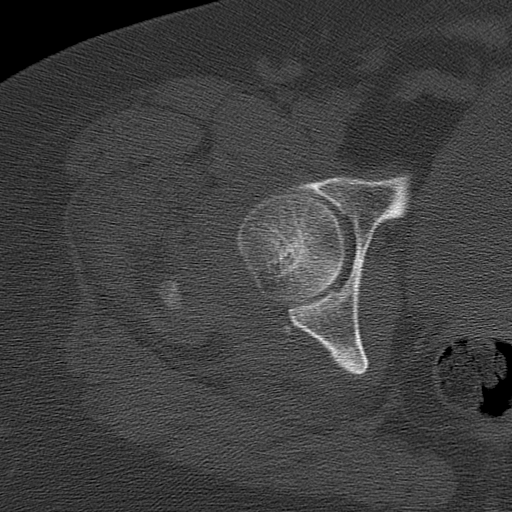
[im 45/59  soft-tissue]
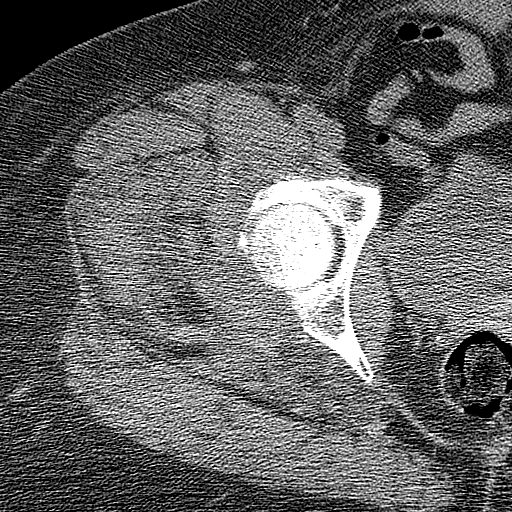
[im 51/59  soft-tissue]
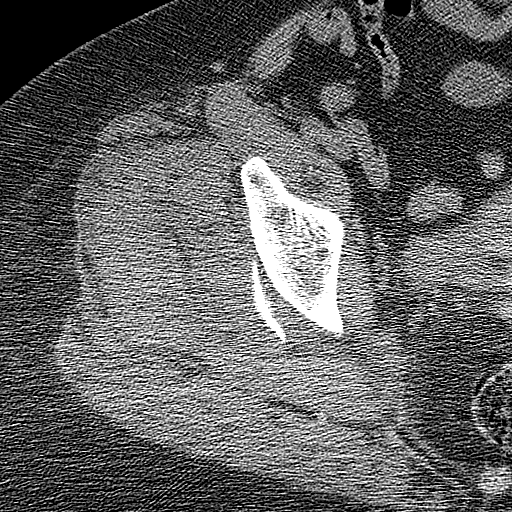
[im 51/59  lung]
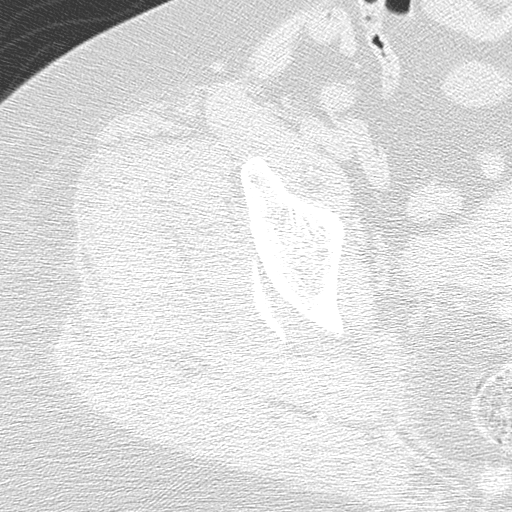
[im 53/59  lung]
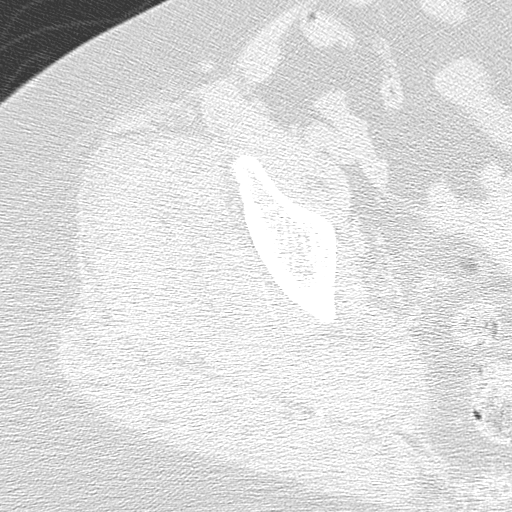
[im 55/59  soft-tissue]
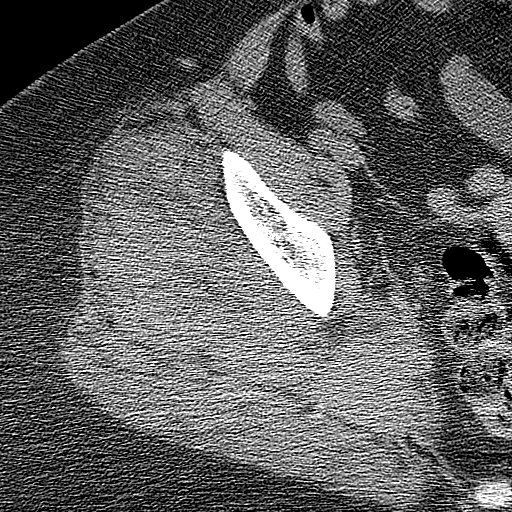
[im 55/59  lung]
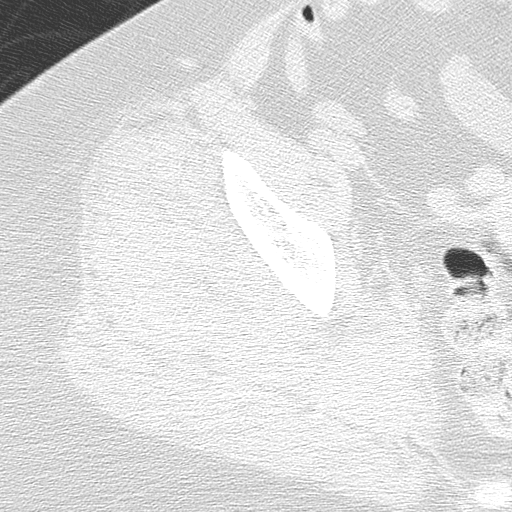
[im 57/59  lung]
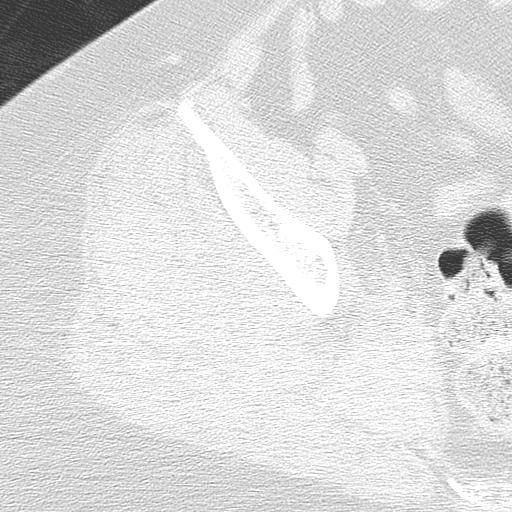

[Series 602: sag · sagittal · 0.36mm/px · 1 of 65 slices shown, 2 images]
[im 22/65  soft-tissue]
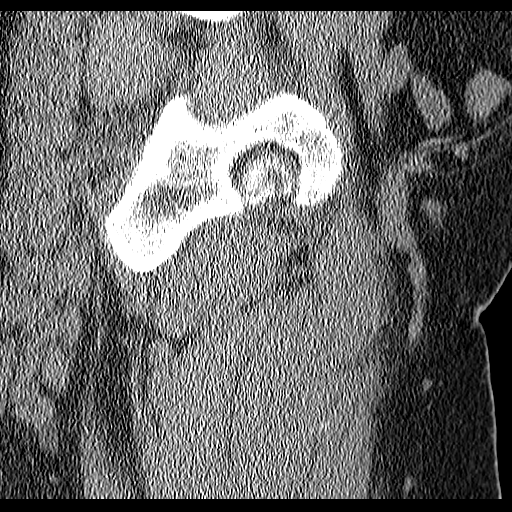
[im 22/65  bone]
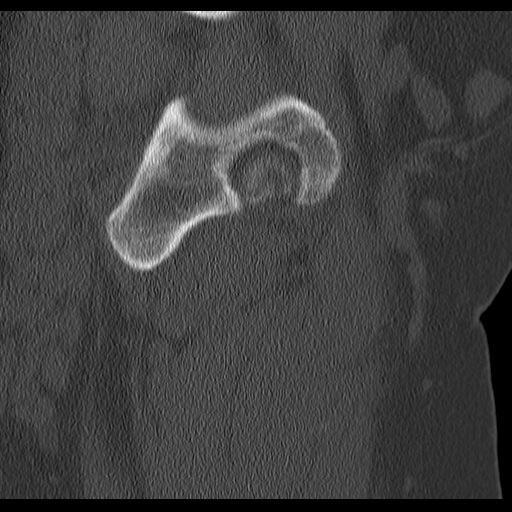

[15 of 36 positions shown; findings below may reference images not displayed]

FINDINGS: Standard CT of the right hip obtained and evaluated in multiple
planes. There is a  displaced fracture of the superior acetabulum.
Comminution is present. Loose bodies in a within the hip joint.Subtle
fracture of the medial portion of the humeral head is noted. This is
minimally displaced. Femoral neck is intact. Pubic symphysis intact.
IMPRESSION: 1. Displaced comminuted fracture of the superior lip of the acetabulum.
2. Subtle fracture of the medial femoral head. This is minimally displaced.
3. Intra-articular loose bodies are noted.

## 2011-03-05 IMAGING — US US EXTREM LOW VENOUS*R*
1 series · 18 of 24 positions shown · non-contrast
Comparison: none

REASON FOR EXAM: Evaluate for DVT.  Patient has significant calf pain
this AM.
COMMENTS:

PROCEDURE:     US  - US DOPPLER LOW EXTR RIGHT  - [DATE]  [DATE]
RESULT:     Right lower extremity color flow duplex Doppler reveals no
evidence of deep venous thrombosis.

[Series 1: us extrem low venous*right* · 18 of 24 slices shown]
[im 1/24]
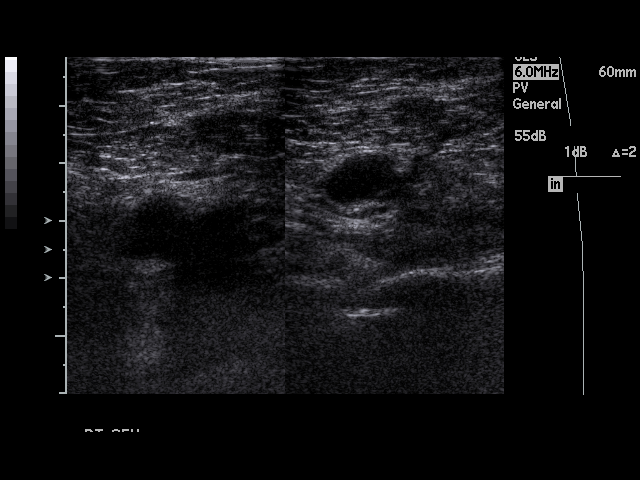
[im 3/24]
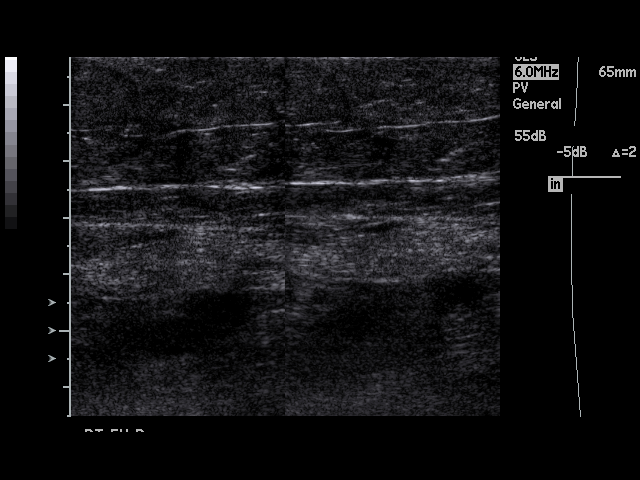
[im 4/24]
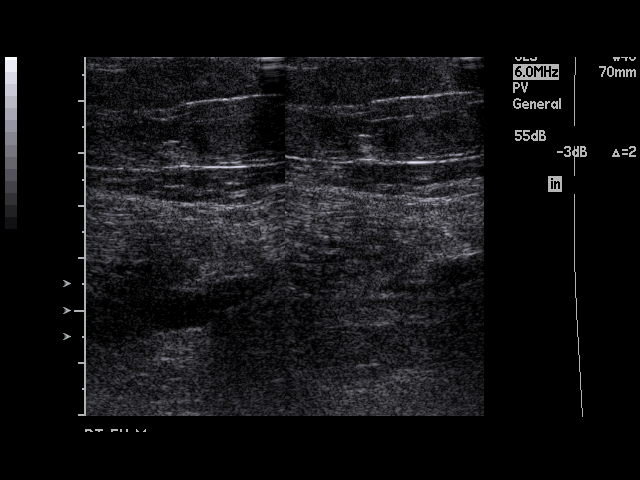
[im 5/24]
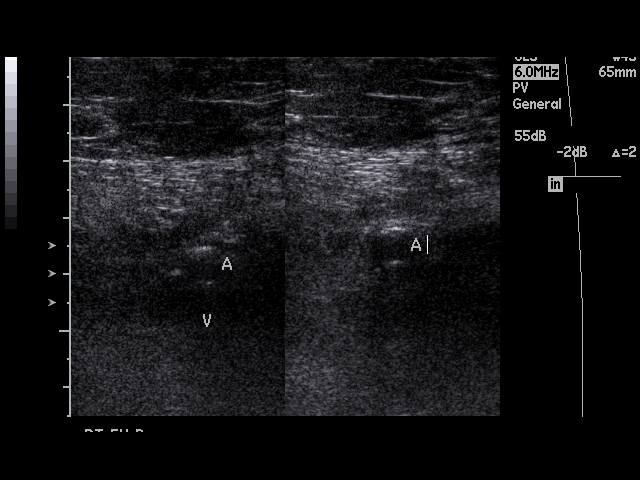
[im 7/24]
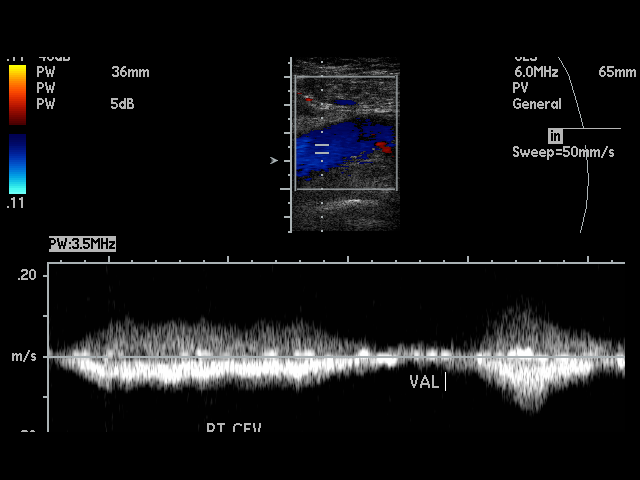
[im 8/24]
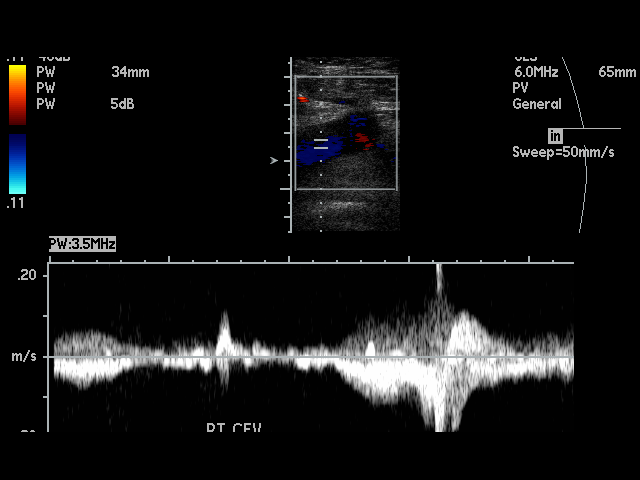
[im 9/24]
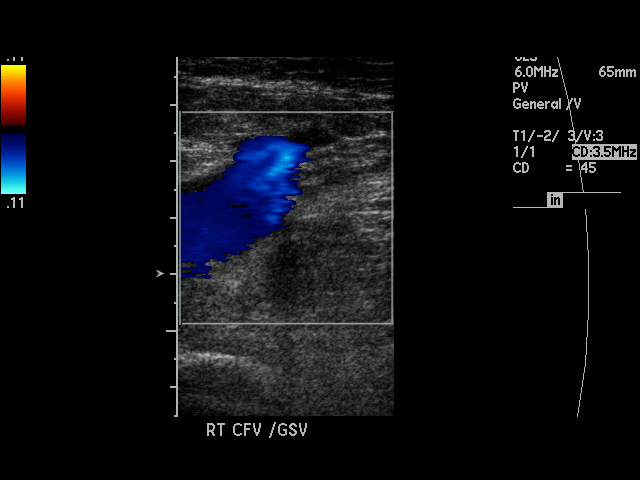
[im 11/24]
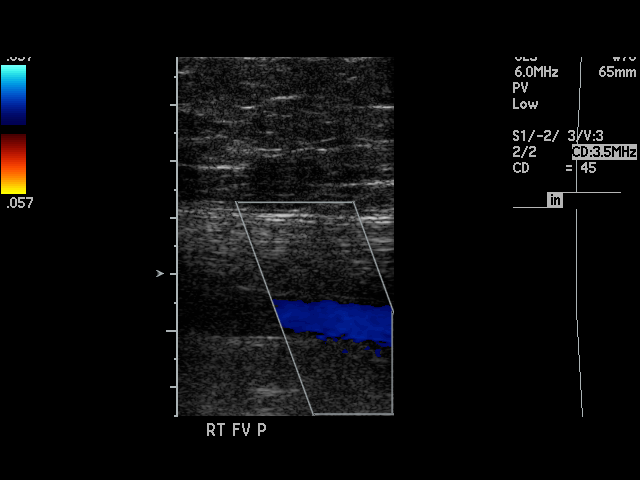
[im 12/24]
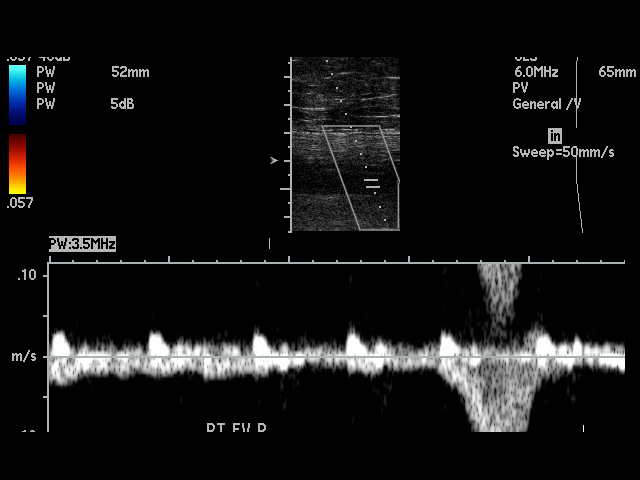
[im 13/24]
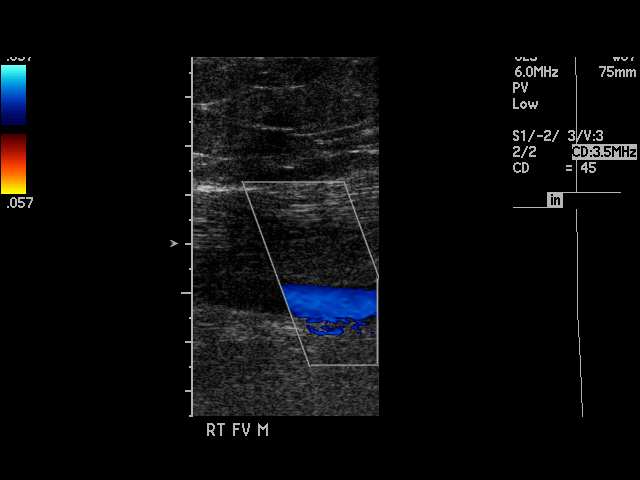
[im 15/24]
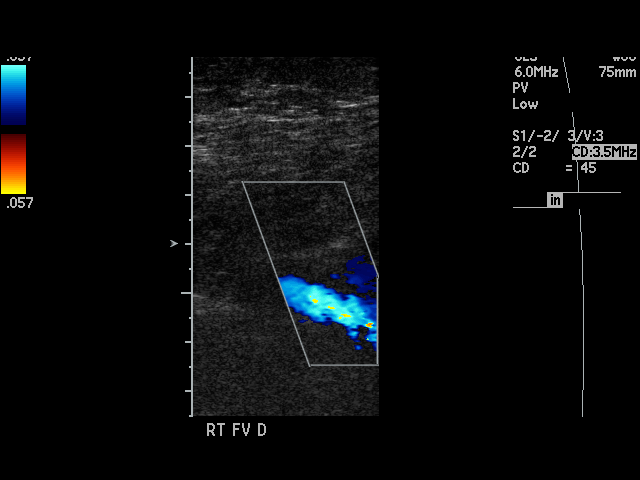
[im 16/24]
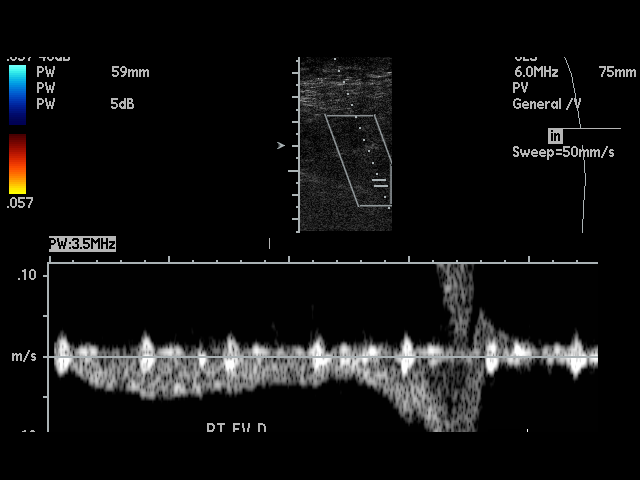
[im 17/24]
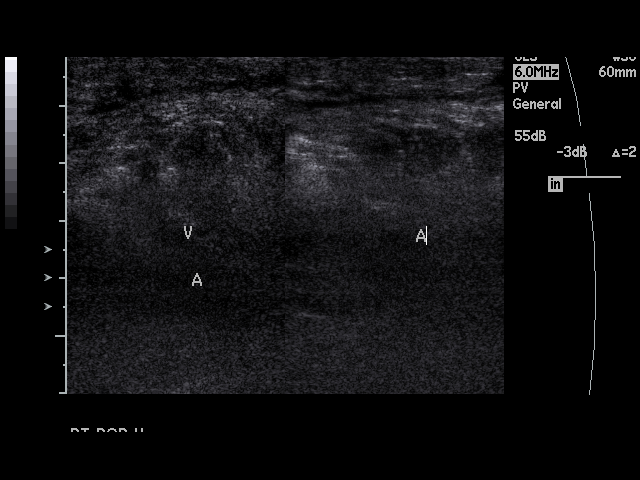
[im 19/24]
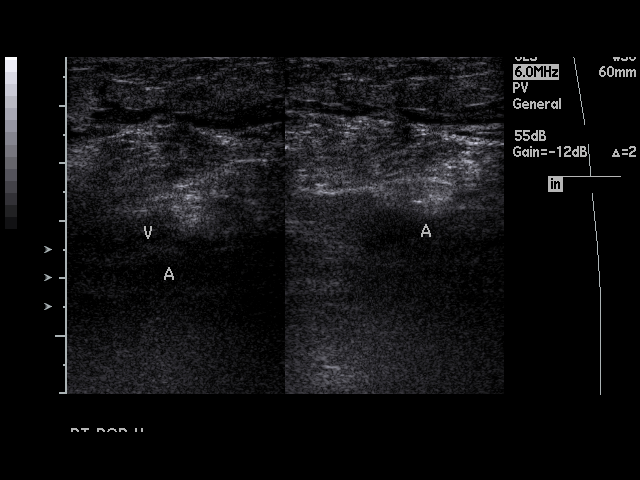
[im 20/24]
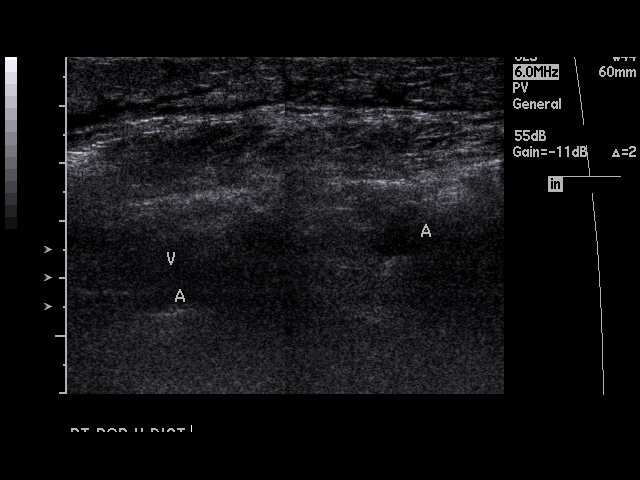
[im 21/24]
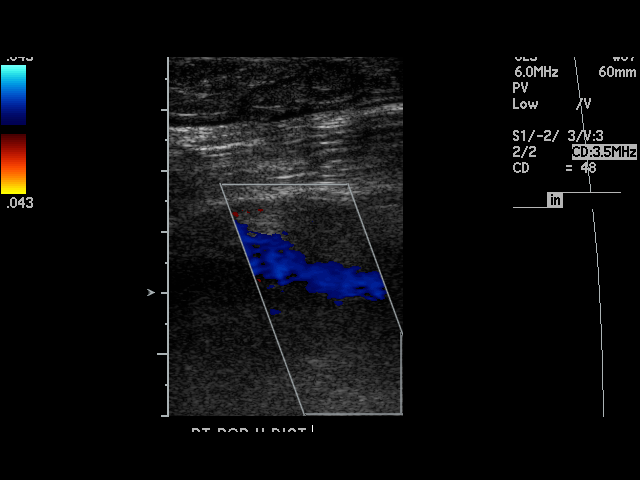
[im 23/24]
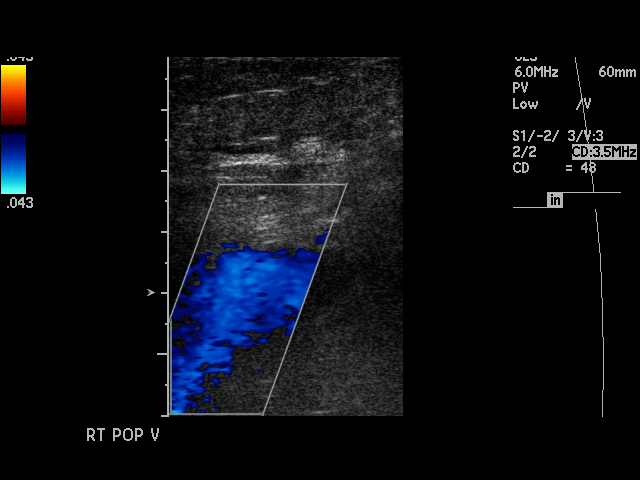
[im 24/24]
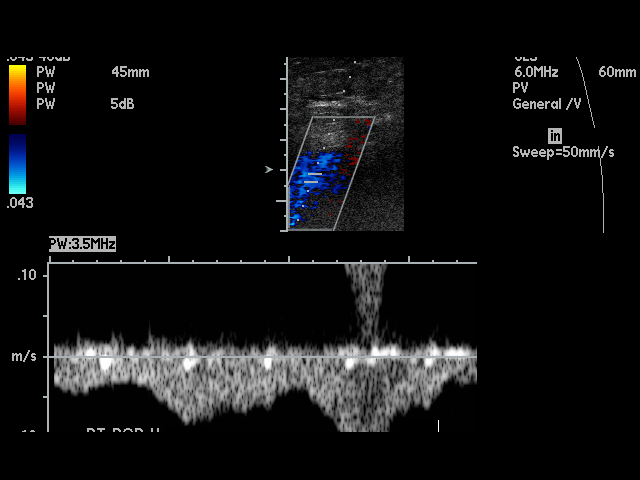

[18 of 24 positions shown; findings below may reference images not displayed]

IMPRESSION: Negative exam.

## 2011-03-06 IMAGING — CR DG ANKLE COMPLETE 3+V*L*
1 series · 4 of 4 positions shown · non-contrast
Comparison: none

REASON FOR EXAM: evaluate for fracture
COMMENTS:

[Series 1: view not recorded · 0.17mm/px · 4 of 4 slices shown]
[im 1/4]
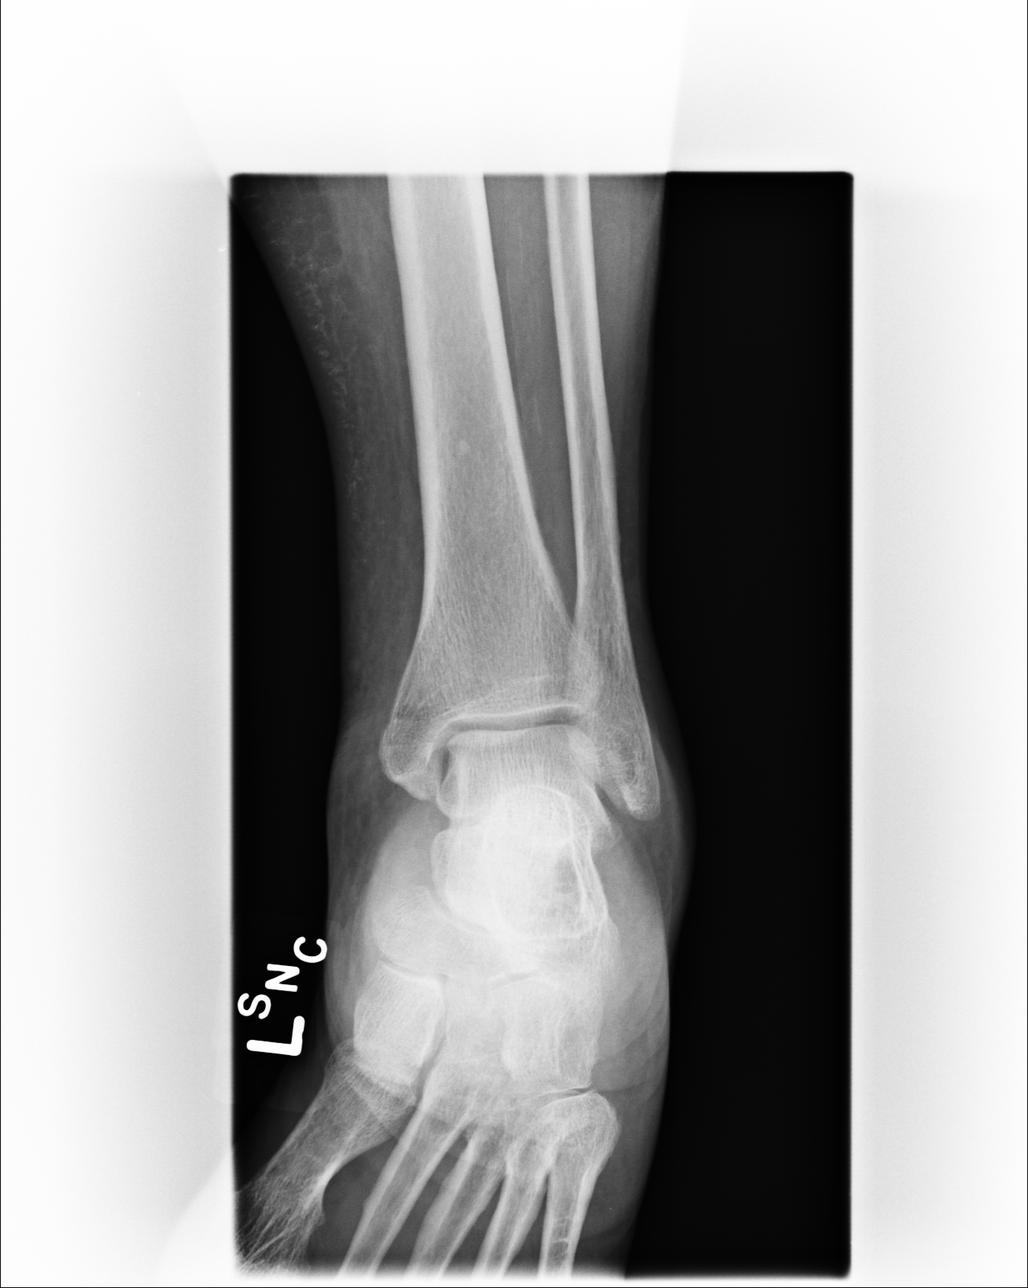
[im 2/4]
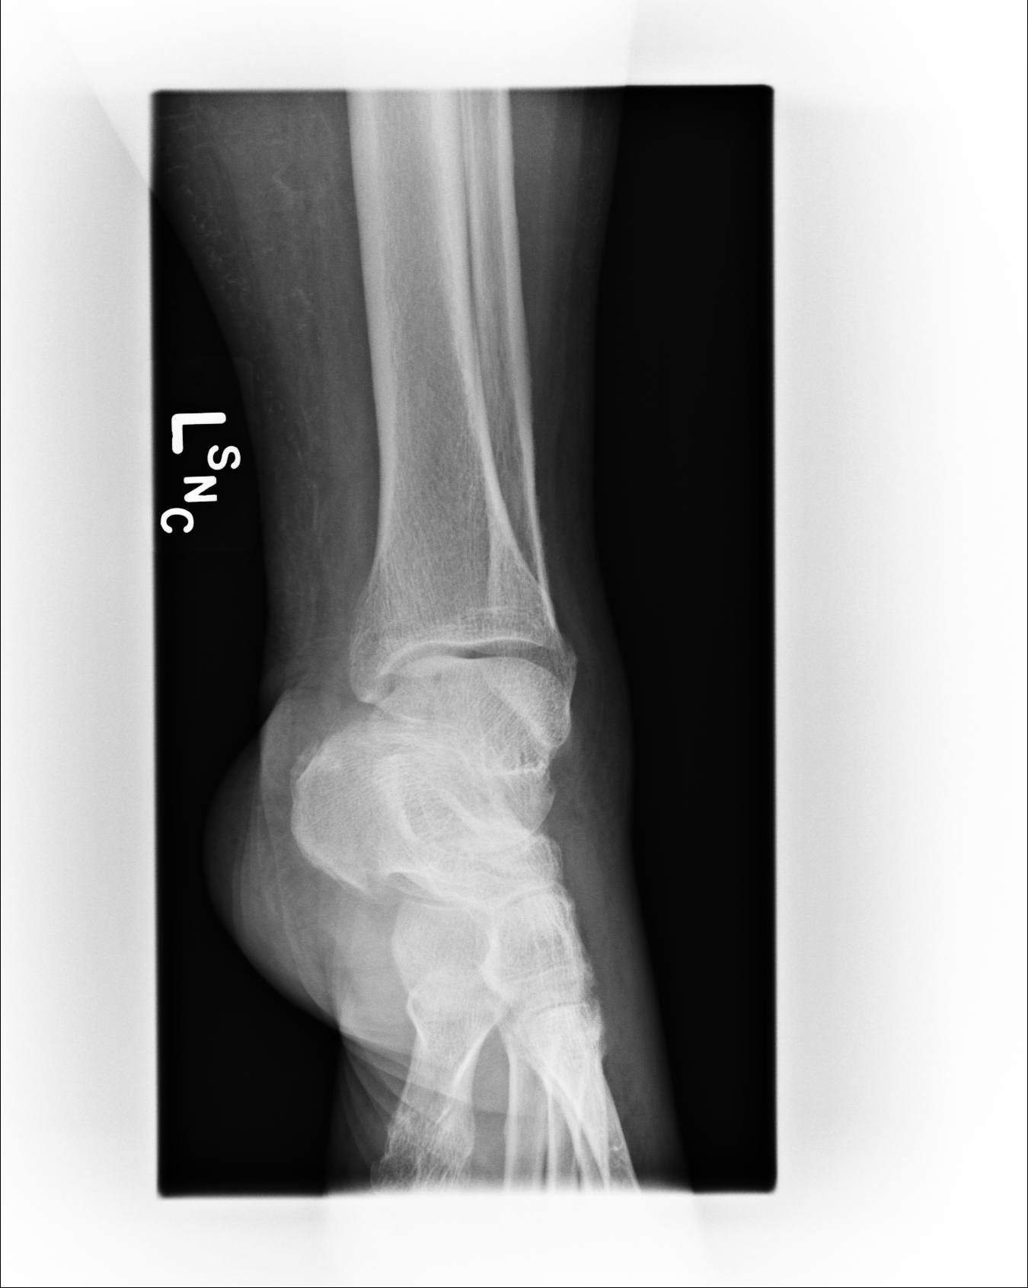
[im 3/4]
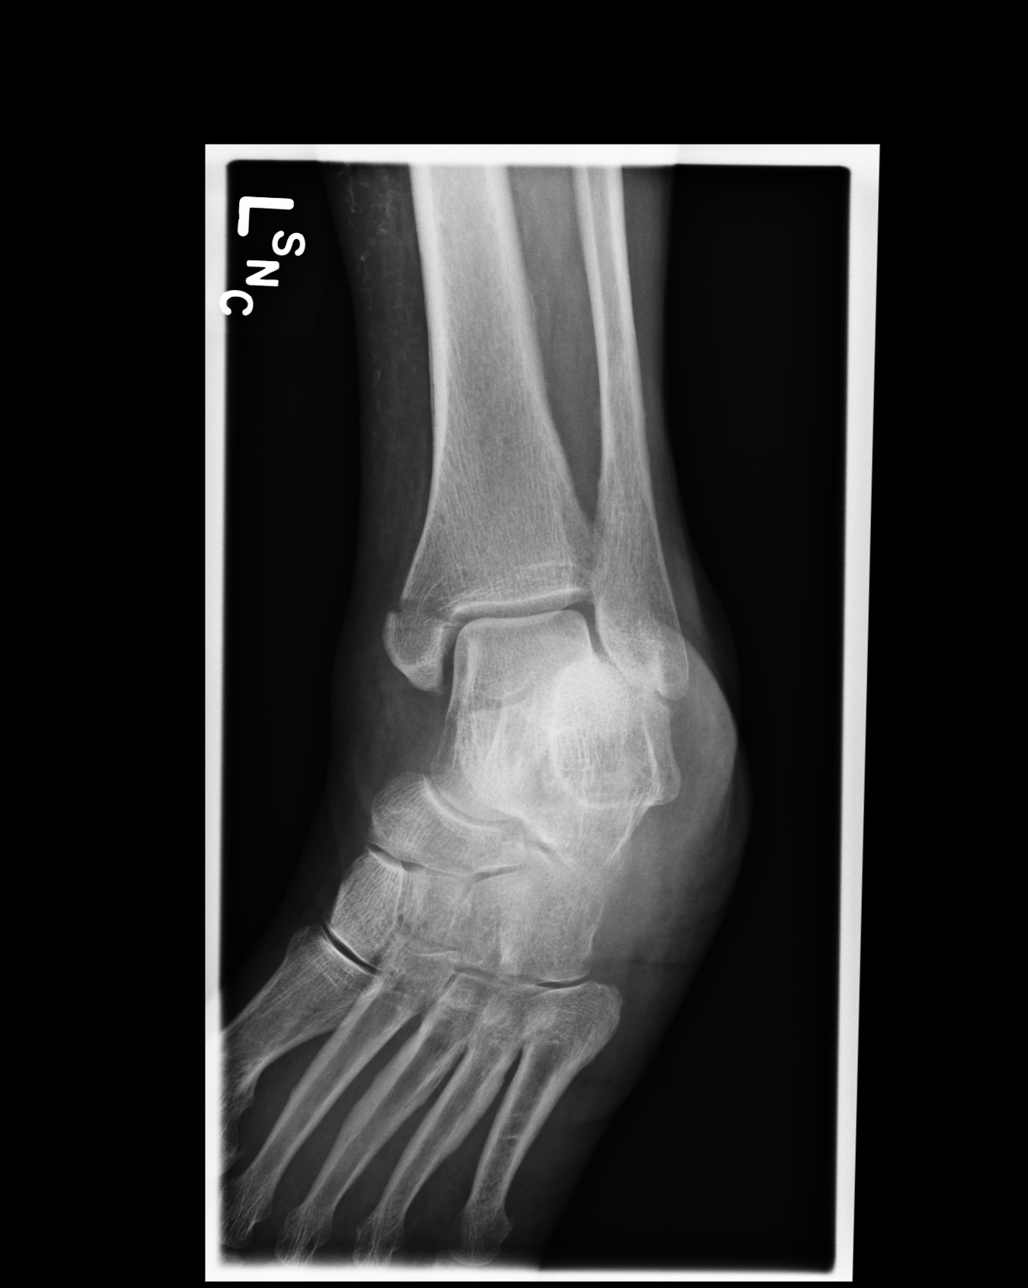
[im 4/4]
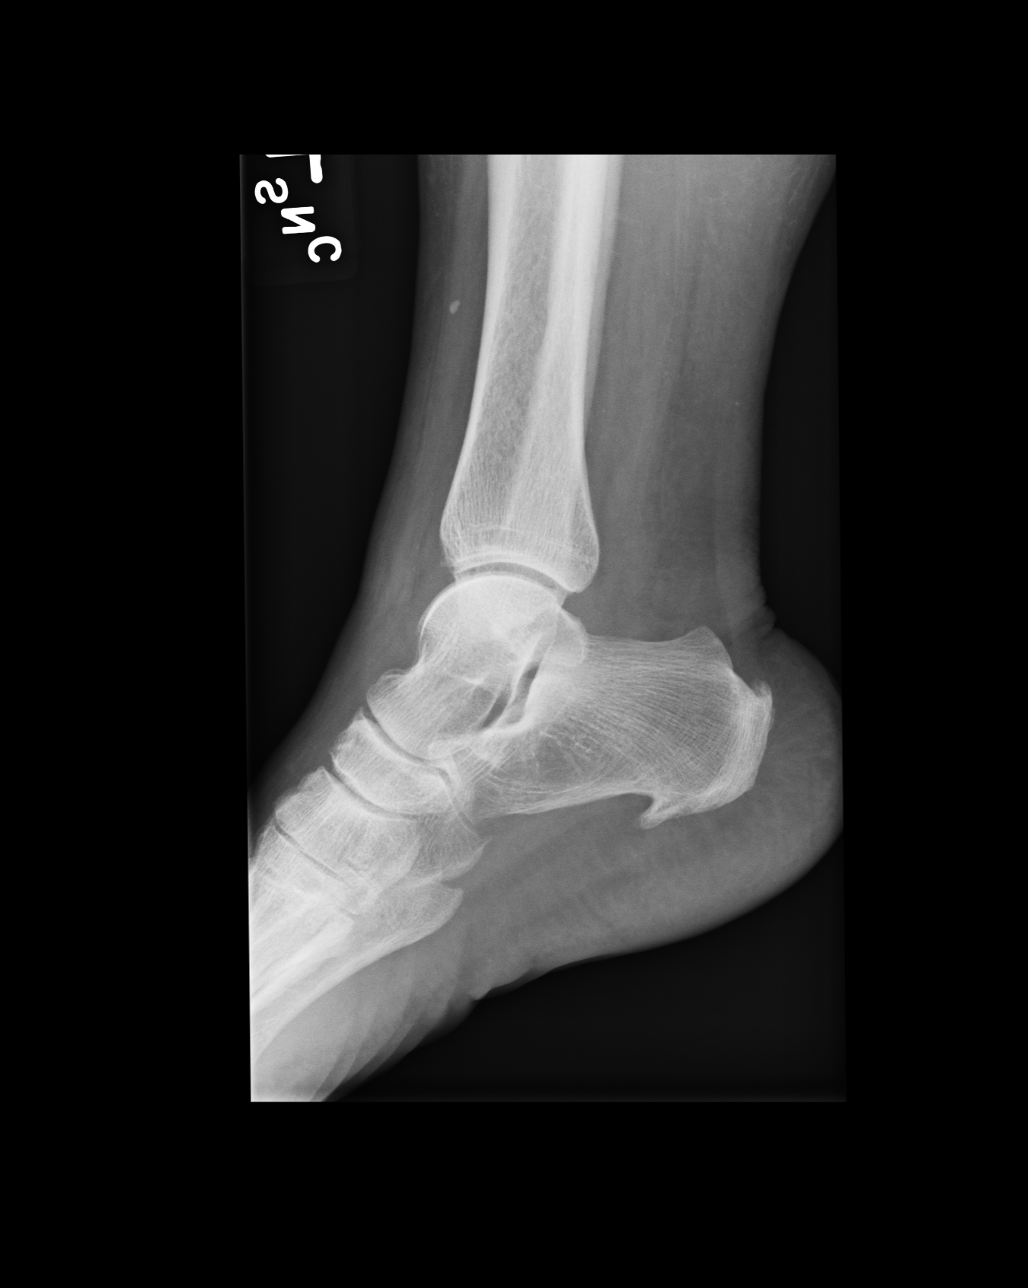

[4 of 4 positions shown; findings below may reference images not displayed]

PROCEDURE:     DXR - DXR ANKLE LEFT COMPLETE  - [DATE]  [DATE]

RESULT:     There is a minimally displaced fracture of the medial malleolus.
No fracture of the lateral malleolus or posterior malleolus of the distal
tibia is seen. The ankle mortise is symmetrical. In the lateral view there
is observed plantar calcaneal spur.
IMPRESSION: 1. Fracture the medial malleolus, minimally displaced.

## 2011-03-06 IMAGING — CR DG FOOT COMPLETE 3+V*L*
1 series · 3 of 3 positions shown · non-contrast
Comparison: none

REASON FOR EXAM: evaluate for fracture
COMMENTS:

[Series 1: view not recorded · 0.17mm/px · 3 of 3 slices shown]
[im 1/3]
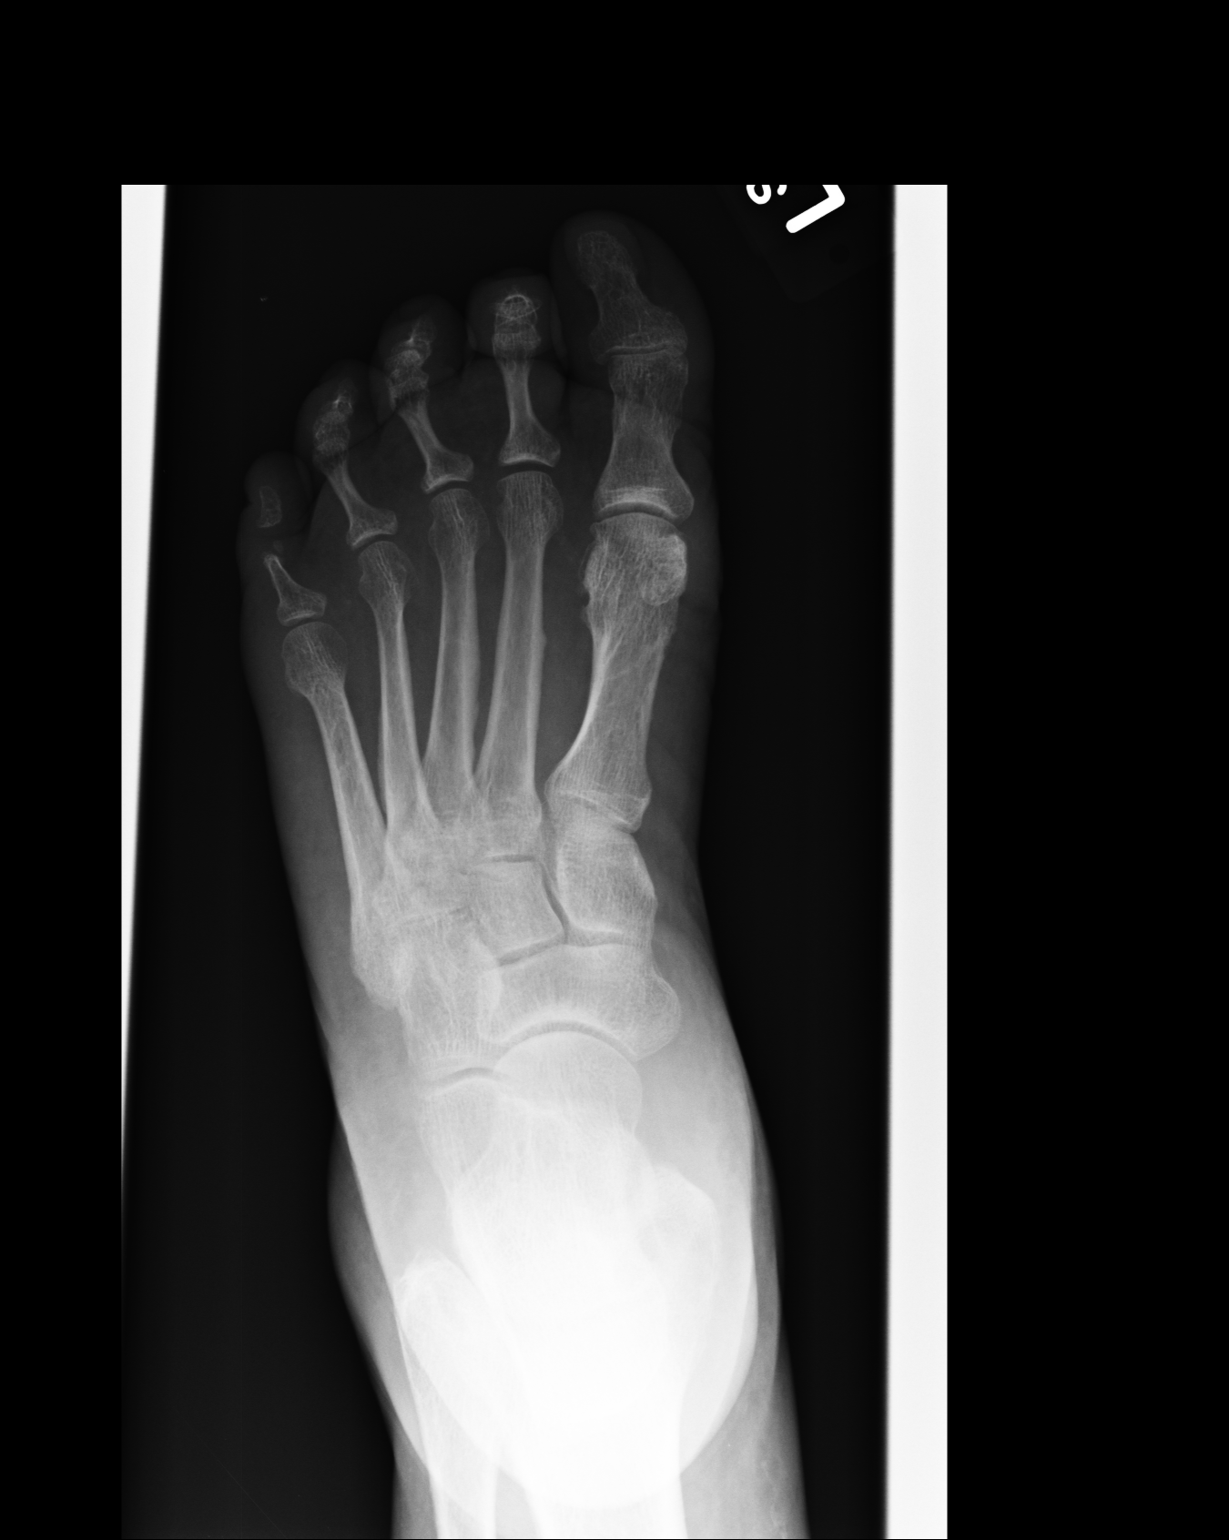
[im 2/3]
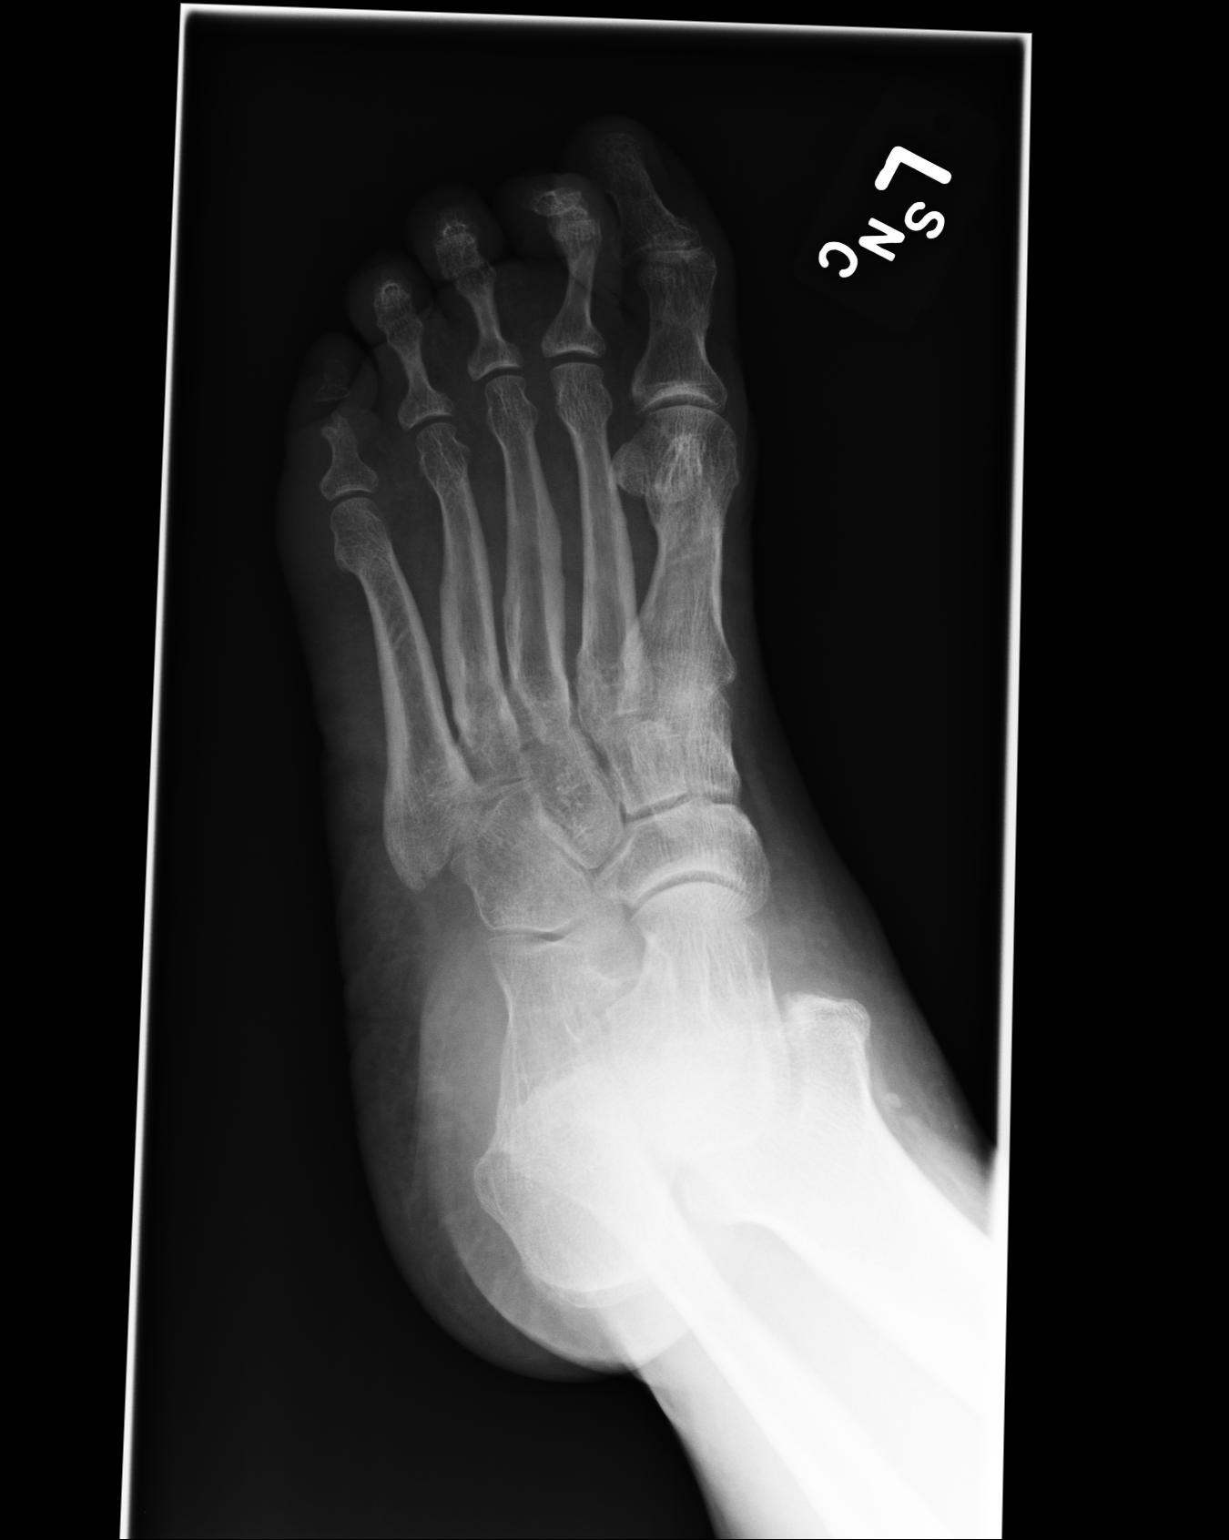
[im 3/3]
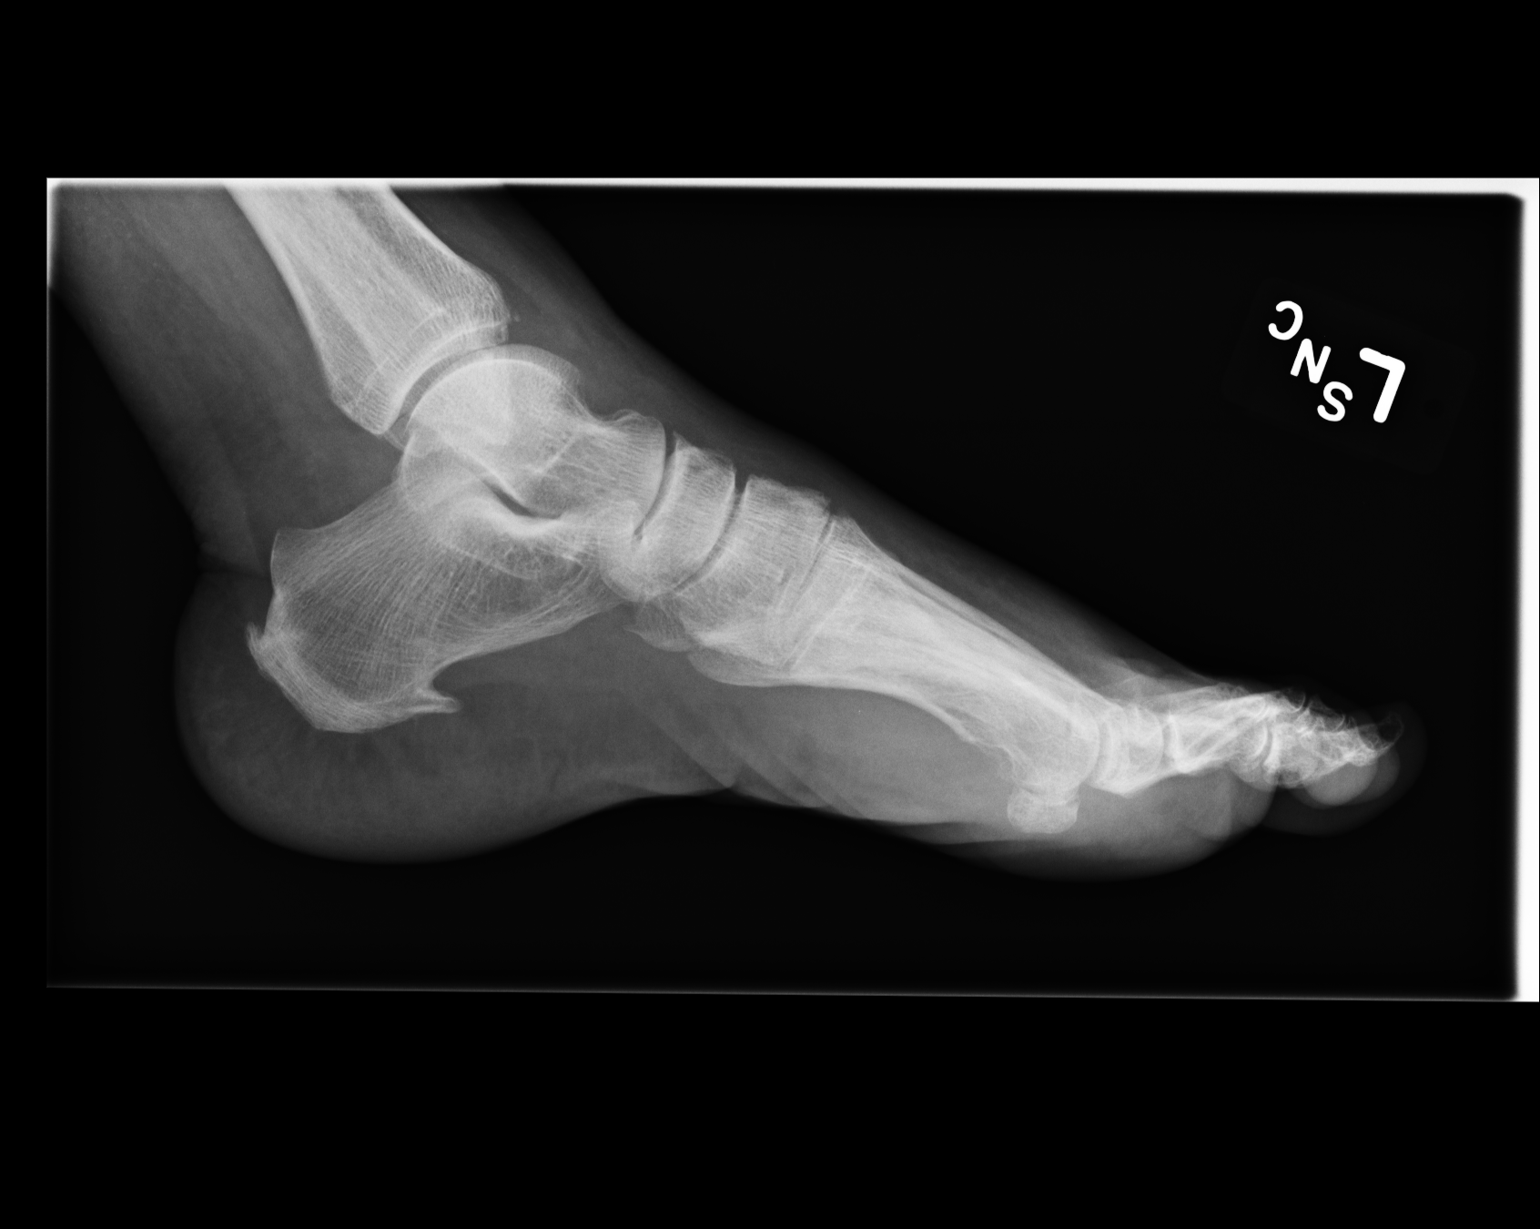

[3 of 3 positions shown; findings below may reference images not displayed]

PROCEDURE:     DXR - DXR FOOT LT COMP W/OBLIQUES  - [DATE]  [DATE]

RESULT:     No fracture, dislocation or other acute bony abnormality is
identified. There is noted deformity of the distal portion of the proximal
phalanx of the fifth toe compatible with residual change from prior injury.
In the lateral view a plantar calcaneal spur is observed.
IMPRESSION: 1. No acute bony abnormalities are identified.
2. There is deformity of the distal portion of the proximal phalanx of the
fifth toe compatible with residual change from prior trauma.
3. A plantar calcaneal spur is observed.

## 2011-03-07 IMAGING — CR DG ANKLE 2V *L*
1 series · 2 of 2 positions shown · non-contrast
Comparison: none

REASON FOR EXAM: Evaluate of left ankle fracture prior to discharge
COMMENTS:   Bedside (portable):Y

PROCEDURE:     DXR - DXR ANKLE LEFT AP AND LATERAL  - [DATE]  [DATE]
RESULT:     Comparison: [DATE]

[Series 1: view not recorded · 0.17mm/px · 2 of 2 slices shown]
[im 1/2]
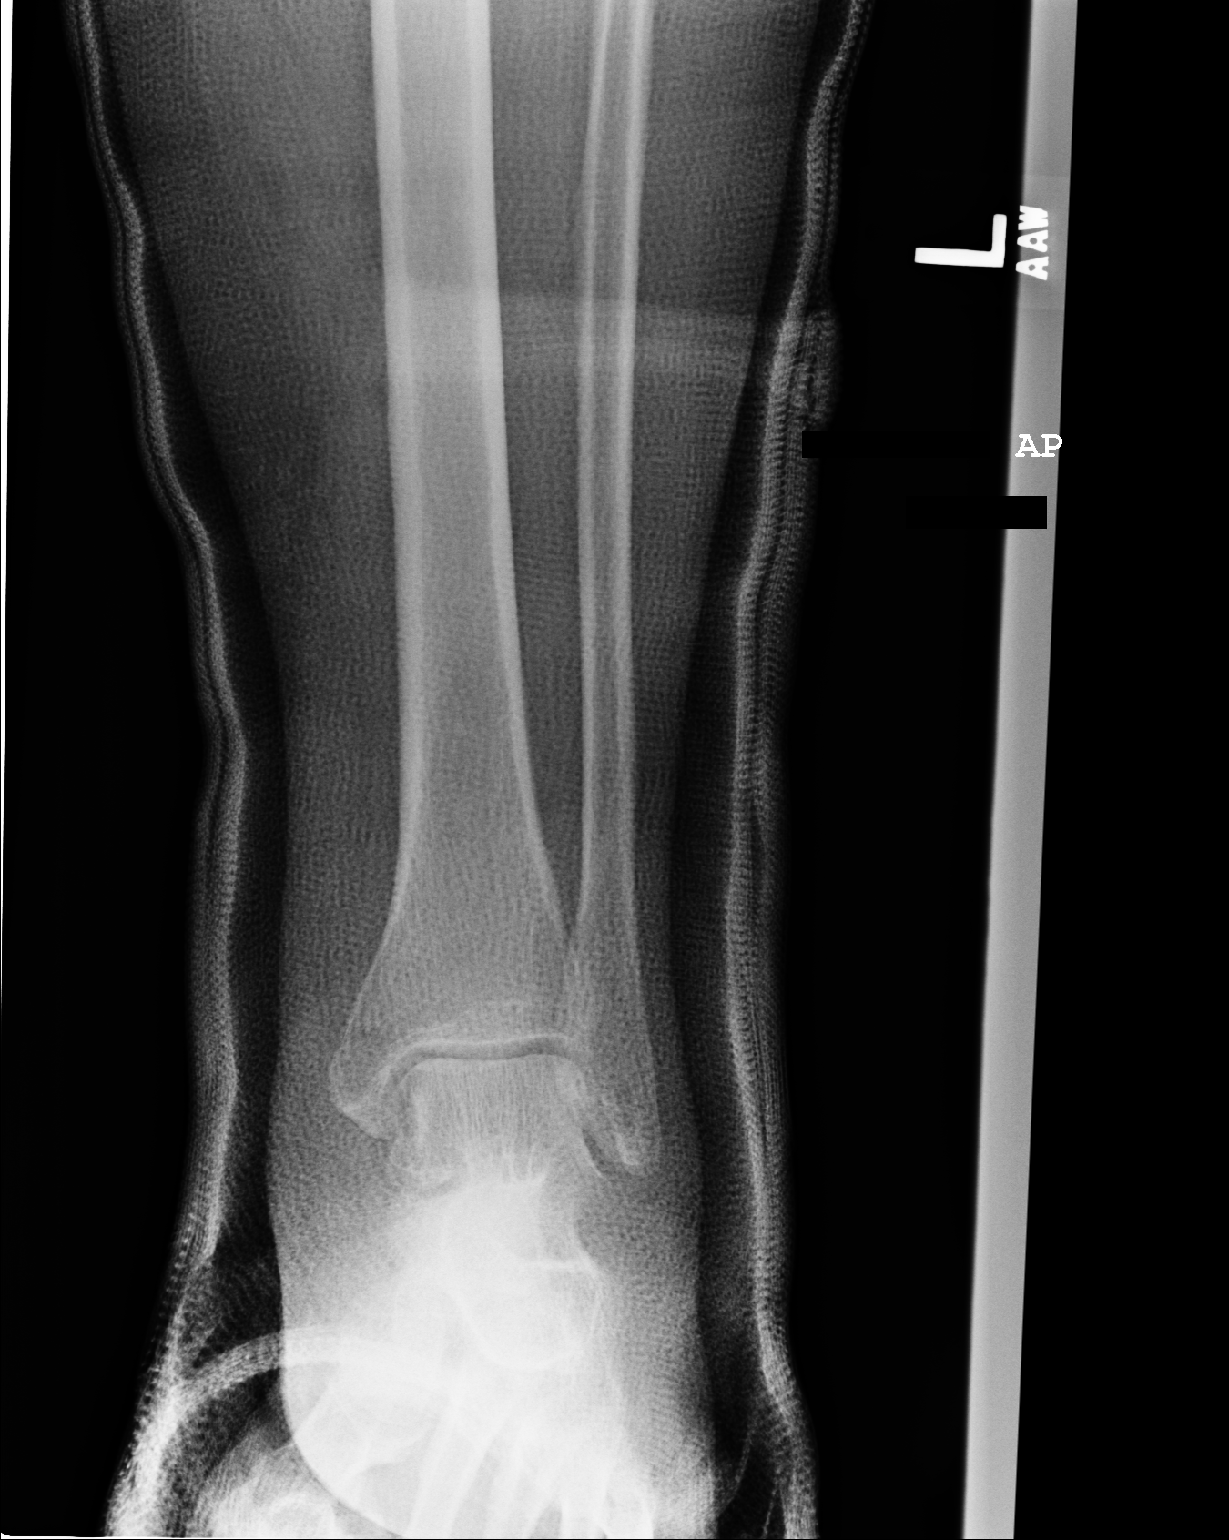
[im 2/2]
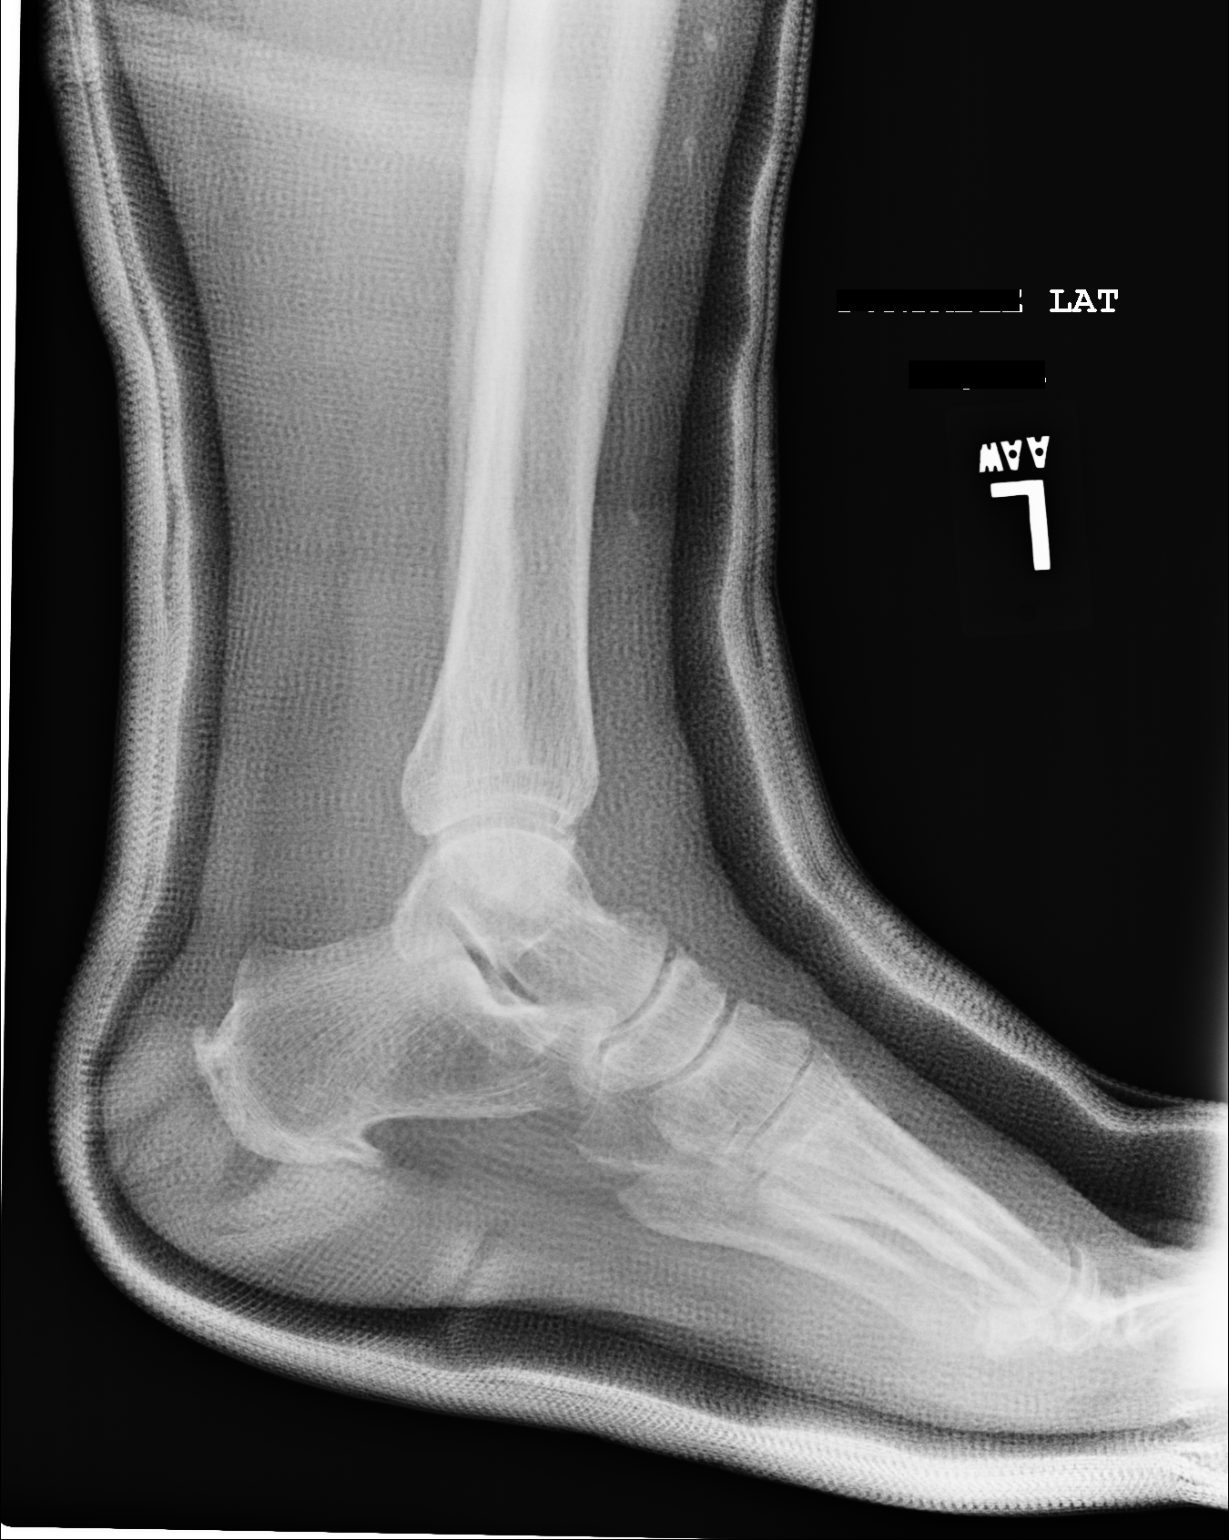

[2 of 2 positions shown; findings below may reference images not displayed]

FINDINGS: Overlying cast material limits evaluation of bony detail. Nondisplaced
fracture seen in the medial malleolus.
IMPRESSION: Please see above.

## 2021-03-08 ENCOUNTER — Other Ambulatory Visit: Payer: Self-pay

## 2021-03-08 ENCOUNTER — Inpatient Hospital Stay
Admission: EM | Admit: 2021-03-08 | Discharge: 2021-03-13 | DRG: 246 | Disposition: A | Discharge status: Home / Self Care | Payer: 59 | Attending: Internal Medicine | Admitting: Internal Medicine

## 2021-03-08 ENCOUNTER — Emergency Department: Payer: 59

## 2021-03-08 DIAGNOSIS — I214 Non-ST elevation (NSTEMI) myocardial infarction: Secondary | ICD-10-CM | POA: Diagnosis not present

## 2021-03-08 DIAGNOSIS — N179 Acute kidney failure, unspecified: Secondary | ICD-10-CM

## 2021-03-08 DIAGNOSIS — N1411 Contrast-induced nephropathy: Secondary | ICD-10-CM | POA: Diagnosis not present

## 2021-03-08 DIAGNOSIS — I10 Essential (primary) hypertension: Secondary | ICD-10-CM

## 2021-03-08 DIAGNOSIS — E1169 Type 2 diabetes mellitus with other specified complication: Secondary | ICD-10-CM | POA: Diagnosis present

## 2021-03-08 DIAGNOSIS — T508X5A Adverse effect of diagnostic agents, initial encounter: Secondary | ICD-10-CM | POA: Diagnosis not present

## 2021-03-08 DIAGNOSIS — I480 Paroxysmal atrial fibrillation: Secondary | ICD-10-CM | POA: Diagnosis present

## 2021-03-08 DIAGNOSIS — I2510 Atherosclerotic heart disease of native coronary artery without angina pectoris: Secondary | ICD-10-CM | POA: Diagnosis present

## 2021-03-08 DIAGNOSIS — Z8673 Personal history of transient ischemic attack (TIA), and cerebral infarction without residual deficits: Secondary | ICD-10-CM

## 2021-03-08 DIAGNOSIS — I130 Hypertensive heart and chronic kidney disease with heart failure and stage 1 through stage 4 chronic kidney disease, or unspecified chronic kidney disease: Secondary | ICD-10-CM | POA: Diagnosis present

## 2021-03-08 DIAGNOSIS — R112 Nausea with vomiting, unspecified: Secondary | ICD-10-CM

## 2021-03-08 DIAGNOSIS — N170 Acute kidney failure with tubular necrosis: Secondary | ICD-10-CM | POA: Diagnosis not present

## 2021-03-08 DIAGNOSIS — E1122 Type 2 diabetes mellitus with diabetic chronic kidney disease: Secondary | ICD-10-CM | POA: Diagnosis present

## 2021-03-08 DIAGNOSIS — Z6836 Body mass index (BMI) 36.0-36.9, adult: Secondary | ICD-10-CM

## 2021-03-08 DIAGNOSIS — Z7901 Long term (current) use of anticoagulants: Secondary | ICD-10-CM

## 2021-03-08 DIAGNOSIS — I161 Hypertensive emergency: Secondary | ICD-10-CM | POA: Diagnosis present

## 2021-03-08 DIAGNOSIS — E119 Type 2 diabetes mellitus without complications: Secondary | ICD-10-CM

## 2021-03-08 DIAGNOSIS — E785 Hyperlipidemia, unspecified: Secondary | ICD-10-CM | POA: Diagnosis present

## 2021-03-08 DIAGNOSIS — Z20822 Contact with and (suspected) exposure to covid-19: Secondary | ICD-10-CM | POA: Diagnosis present

## 2021-03-08 DIAGNOSIS — I4891 Unspecified atrial fibrillation: Secondary | ICD-10-CM

## 2021-03-08 DIAGNOSIS — R519 Headache, unspecified: Secondary | ICD-10-CM

## 2021-03-08 DIAGNOSIS — N184 Chronic kidney disease, stage 4 (severe): Secondary | ICD-10-CM | POA: Diagnosis present

## 2021-03-08 DIAGNOSIS — I959 Hypotension, unspecified: Secondary | ICD-10-CM | POA: Diagnosis not present

## 2021-03-08 DIAGNOSIS — I252 Old myocardial infarction: Secondary | ICD-10-CM

## 2021-03-08 HISTORY — DX: Heart failure, unspecified: I50.9

## 2021-03-08 HISTORY — DX: Essential (primary) hypertension: I10

## 2021-03-08 HISTORY — DX: Acute myocardial infarction, unspecified: I21.9

## 2021-03-08 HISTORY — DX: Unspecified atrial fibrillation: I48.91

## 2021-03-08 LAB — CBC
HCT: 34.2 % — ABNORMAL LOW (ref 36.0–46.0)
Hemoglobin: 11.7 g/dL — ABNORMAL LOW (ref 12.0–15.0)
MCH: 29.5 pg (ref 26.0–34.0)
MCHC: 34.2 g/dL (ref 30.0–36.0)
MCV: 86.1 fL (ref 80.0–100.0)
Platelets: 166 10*3/uL (ref 150–400)
RBC: 3.97 MIL/uL (ref 3.87–5.11)
RDW: 14.9 % (ref 11.5–15.5)
WBC: 4.7 10*3/uL (ref 4.0–10.5)
nRBC: 0 % (ref 0.0–0.2)

## 2021-03-08 LAB — HEPATIC FUNCTION PANEL
ALT: 11 U/L (ref 0–44)
AST: 29 U/L (ref 15–41)
Albumin: 3.3 g/dL — ABNORMAL LOW (ref 3.5–5.0)
Alkaline Phosphatase: 139 U/L — ABNORMAL HIGH (ref 38–126)
Bilirubin, Direct: 0.2 mg/dL (ref 0.0–0.2)
Indirect Bilirubin: 0.7 mg/dL (ref 0.3–0.9)
Total Bilirubin: 0.9 mg/dL (ref 0.3–1.2)
Total Protein: 9 g/dL — ABNORMAL HIGH (ref 6.5–8.1)

## 2021-03-08 LAB — BASIC METABOLIC PANEL
Anion gap: 11 (ref 5–15)
BUN: 36 mg/dL — ABNORMAL HIGH (ref 8–23)
CO2: 16 mmol/L — ABNORMAL LOW (ref 22–32)
Calcium: 9.4 mg/dL (ref 8.9–10.3)
Chloride: 109 mmol/L (ref 98–111)
Creatinine, Ser: 2.59 mg/dL — ABNORMAL HIGH (ref 0.44–1.00)
GFR, Estimated: 19 mL/min — ABNORMAL LOW (ref >60)
Glucose, Bld: 154 mg/dL — ABNORMAL HIGH (ref 70–99)
Potassium: 4.8 mmol/L (ref 3.5–5.1)
Sodium: 136 mmol/L (ref 135–145)

## 2021-03-08 LAB — TROPONIN I (HIGH SENSITIVITY)
Troponin I (High Sensitivity): 171 ng/L (ref <18)
Troponin I (High Sensitivity): 1735 ng/L (ref <18)

## 2021-03-08 LAB — RESP PANEL BY RT-PCR (FLU A&B, COVID) ARPGX2
Influenza A by PCR: NEGATIVE
Influenza B by PCR: NEGATIVE
SARS Coronavirus 2 by RT PCR: NEGATIVE

## 2021-03-08 LAB — BRAIN NATRIURETIC PEPTIDE: B Natriuretic Peptide: 215.2 pg/mL — ABNORMAL HIGH (ref 0.0–100.0)

## 2021-03-08 LAB — MAGNESIUM: Magnesium: 1.8 mg/dL (ref 1.7–2.4)

## 2021-03-08 IMAGING — CR DG CHEST 2V
1 series · 2 of 2 positions shown · non-contrast
Comparison: Chest x-ray [DATE].

CLINICAL DATA: Chest pain.  Emesis.

EXAM:
CHEST - 2 VIEW

[Series 1: dg chest 2 view · 0.14mm/px · 2 of 2 slices shown]
[im 1/2]
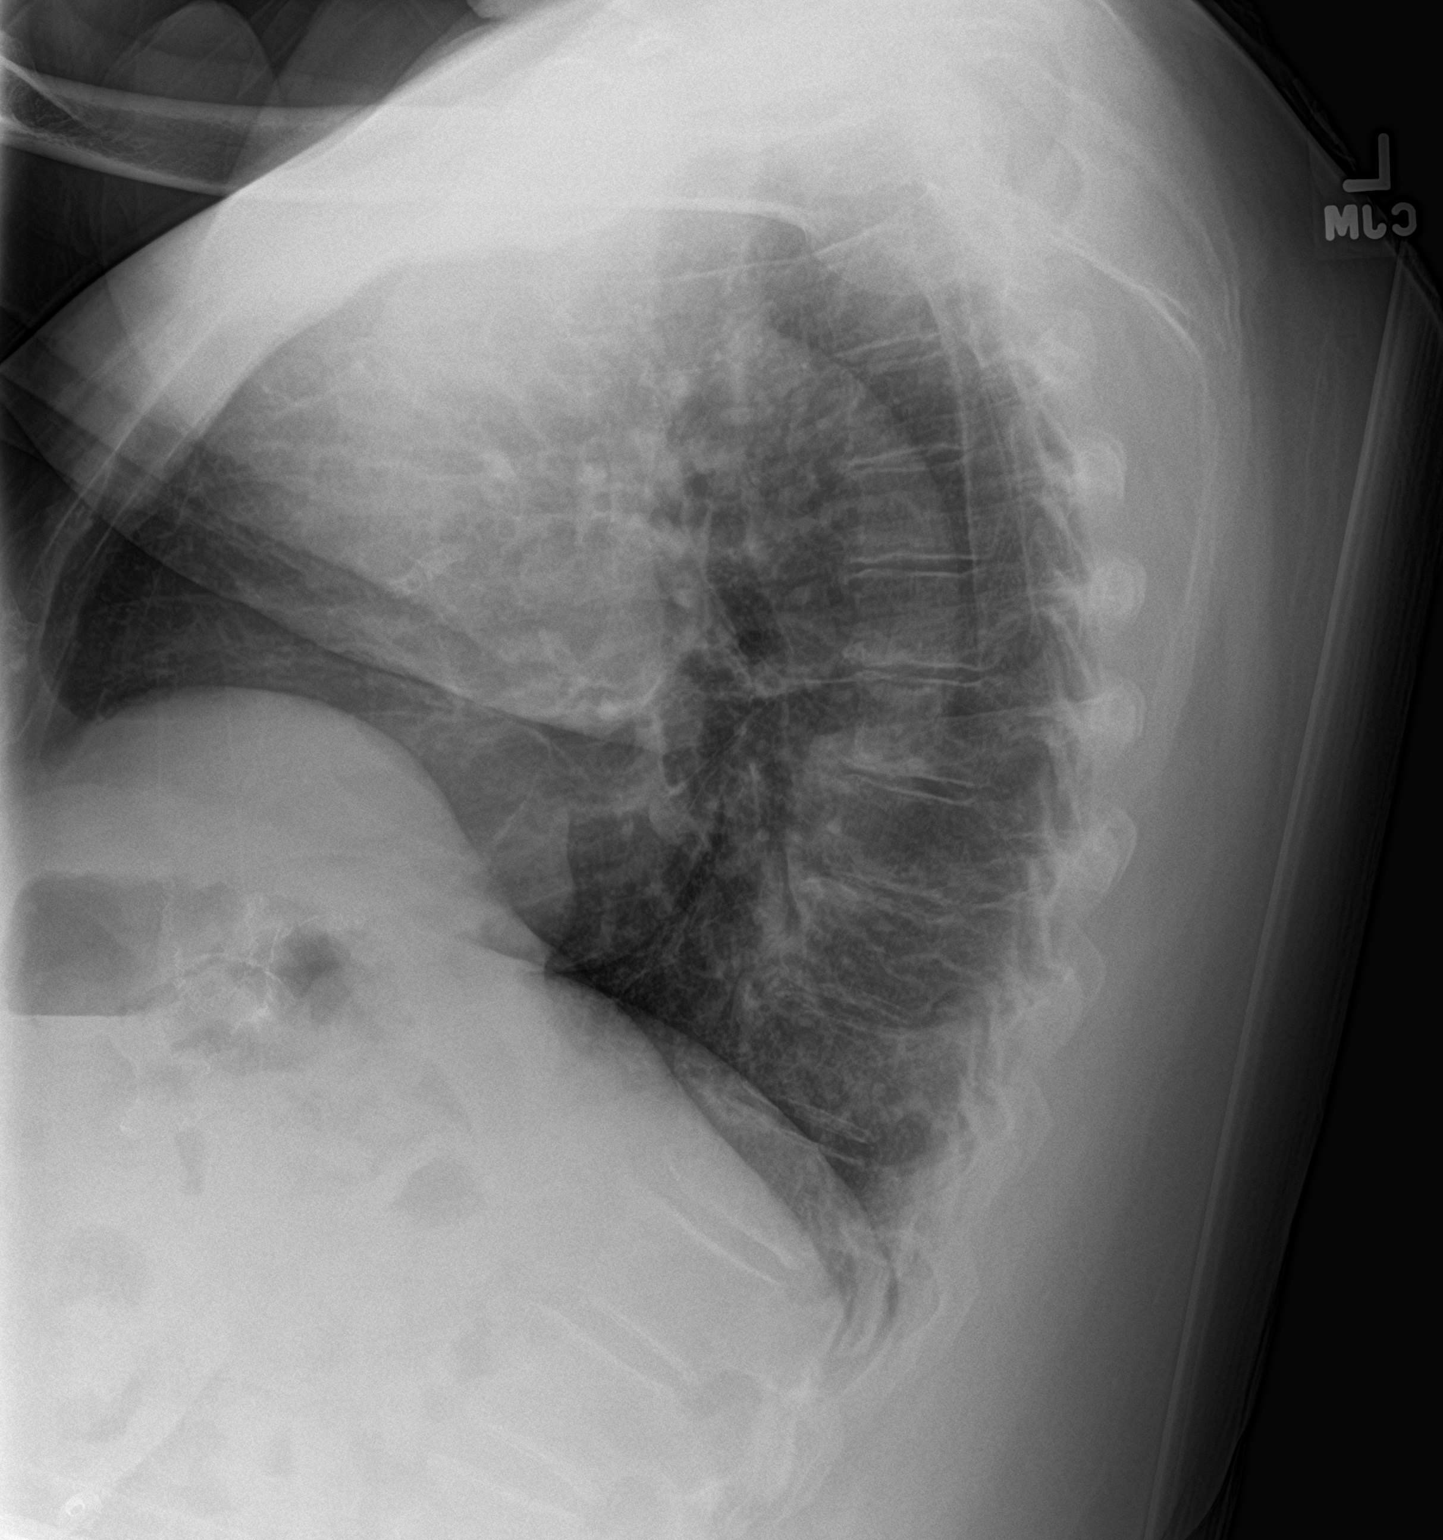
[im 2/2]
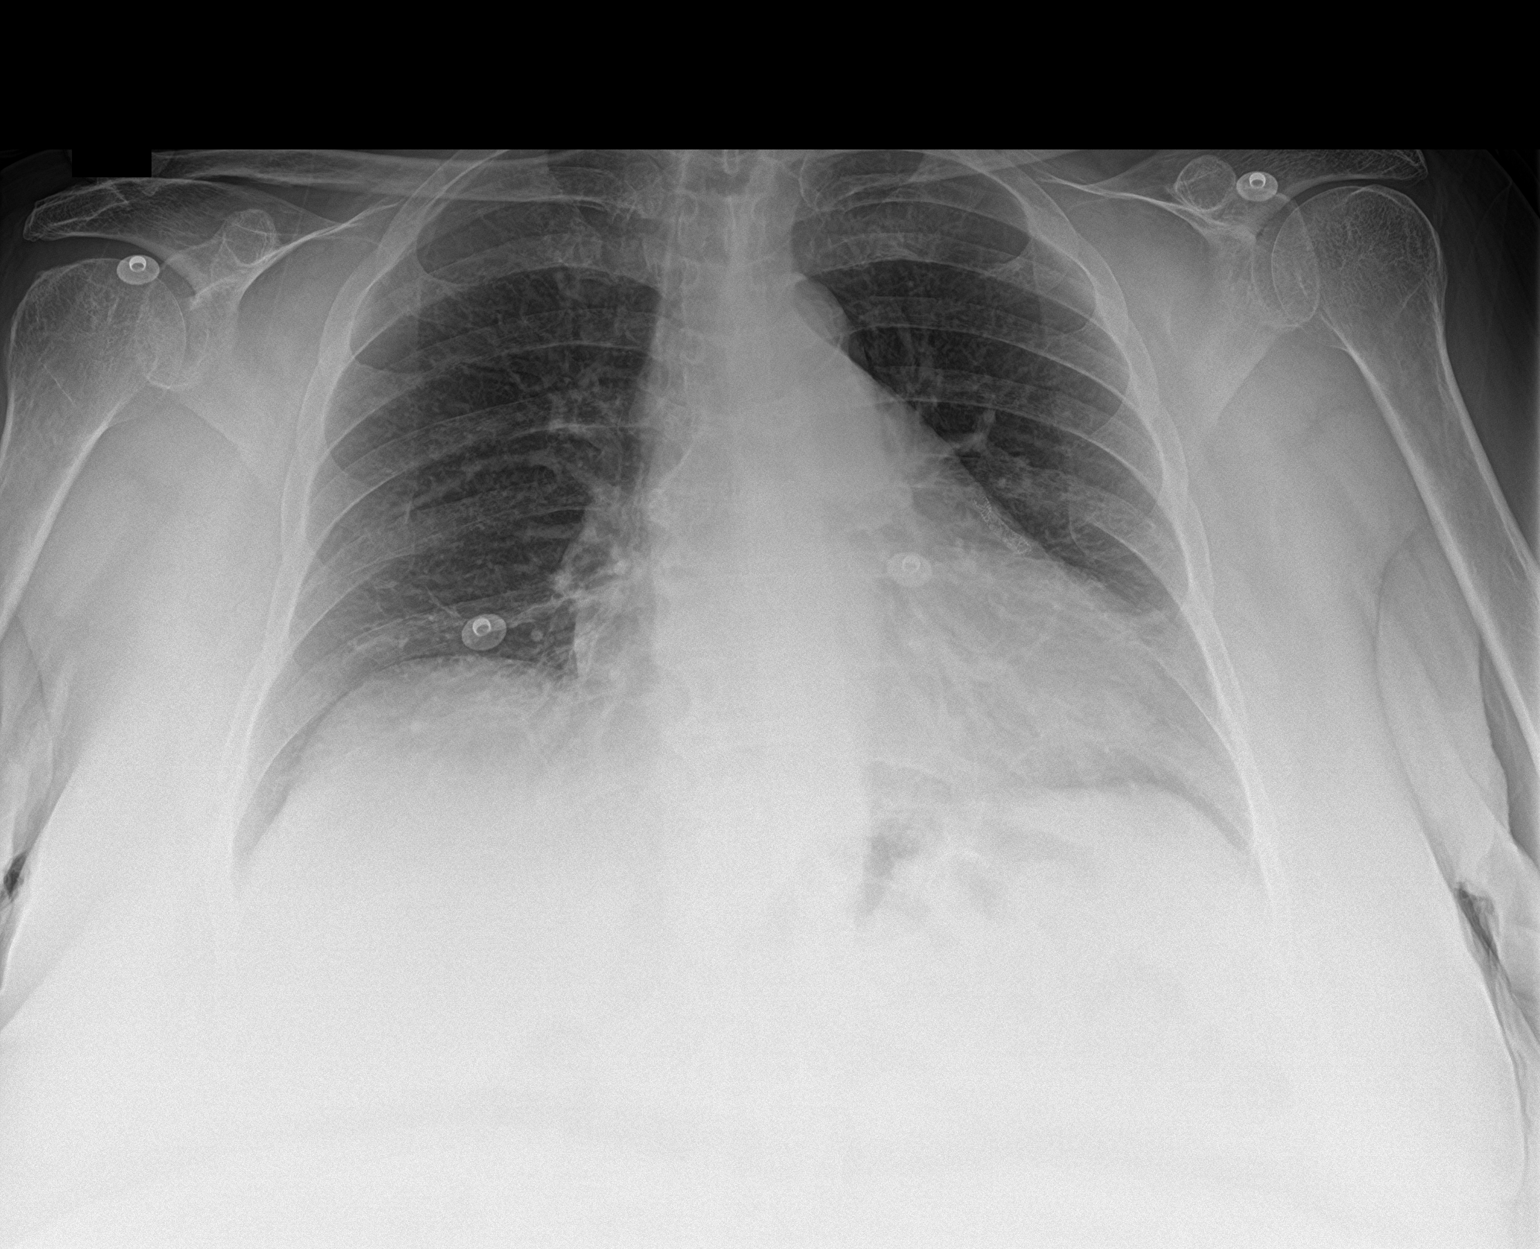

[2 of 2 positions shown; findings below may reference images not displayed]

FINDINGS: The heart and mediastinal contours are within normal limits.
Coronary artery stent. Aortic calcification.

No focal consolidation. No pulmonary edema. No pleural effusion. No
pneumothorax.

No acute osseous abnormality.
IMPRESSION: No active cardiopulmonary disease.

## 2021-03-08 IMAGING — CT CT HEAD W/O CM
3 series · 15 of 46 positions shown, 18 images · non-contrast
Comparison: None.

CLINICAL DATA: Headache, intracranial hemorrhage suspected

EXAM:
CT HEAD WITHOUT CONTRAST
TECHNIQUE: Contiguous axial images were obtained from the base of the skull
through the vertex without intravenous contrast.

[Series 2: head wo · axial · 0.43mm/px · z∈[-130,-10]mm · 9 of 29 slices shown, 12 images]
[im 3/29  brain]
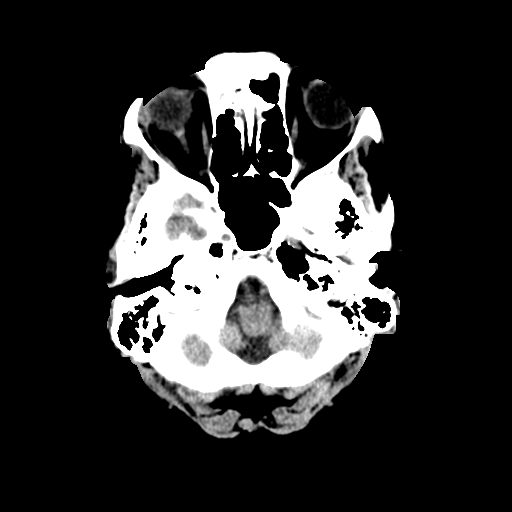
[im 3/29  bone]
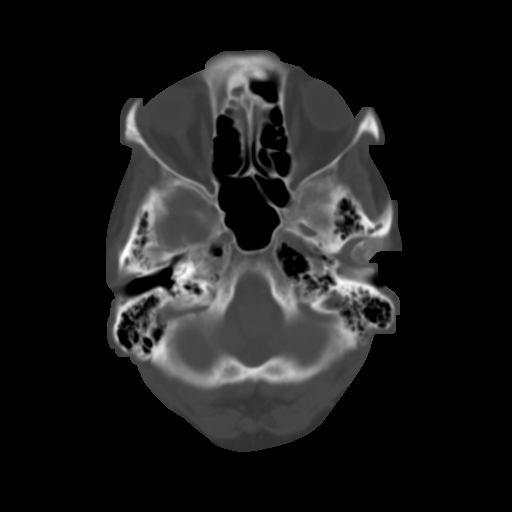
[im 6/29  brain]
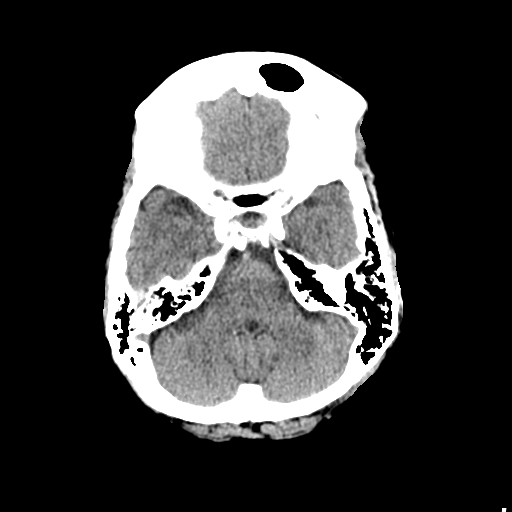
[im 9/29  brain]
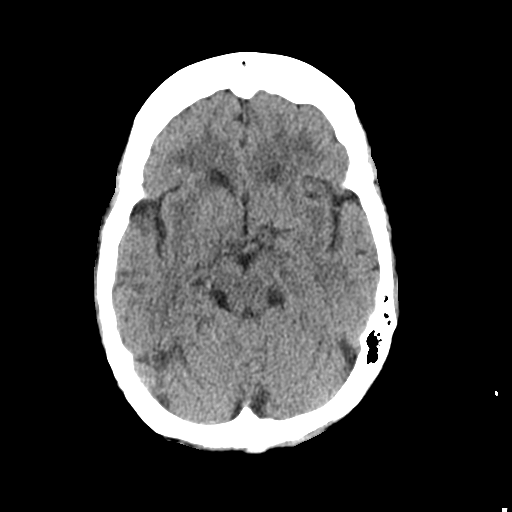
[im 12/29  brain]
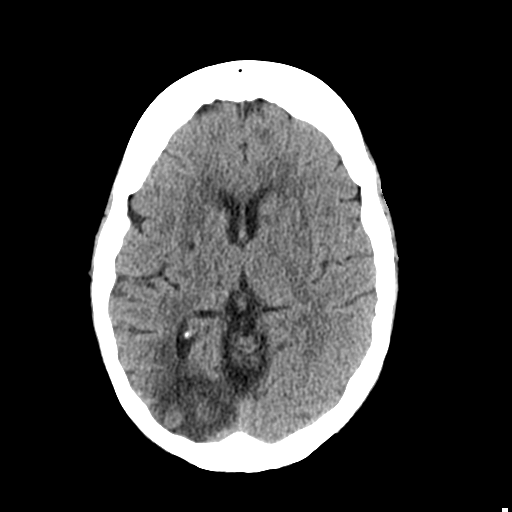
[im 15/29  brain]
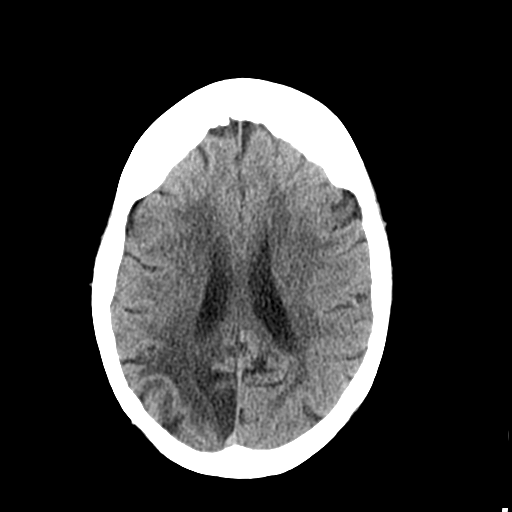
[im 15/29  bone]
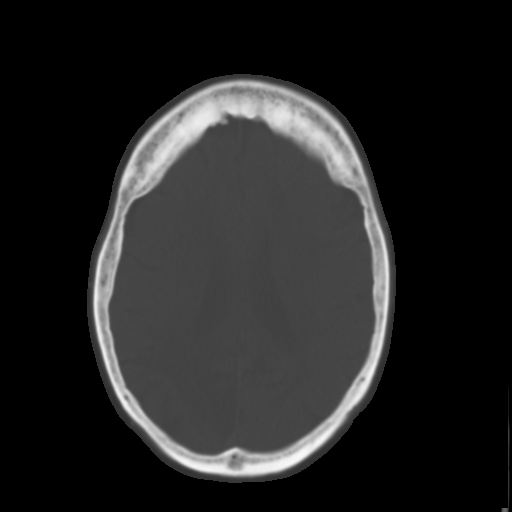
[im 18/29  brain]
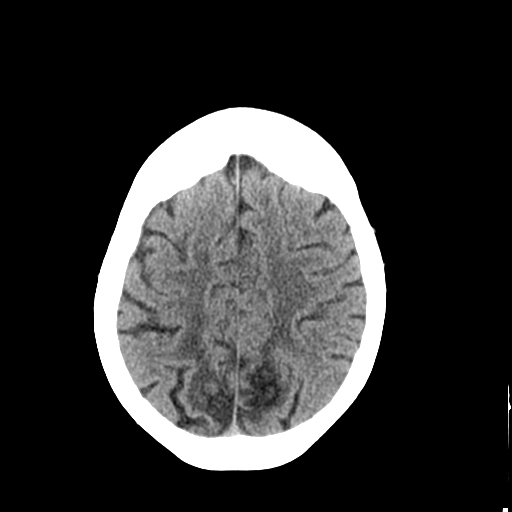
[im 21/29  brain]
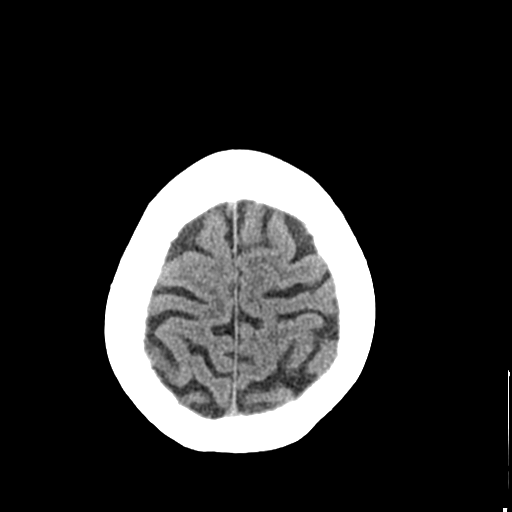
[im 24/29  brain]
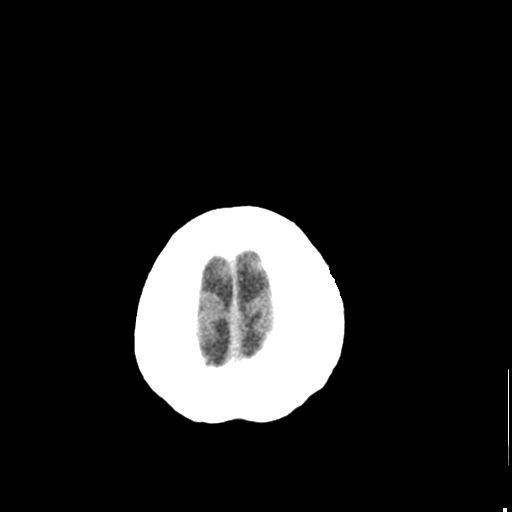
[im 27/29  brain]
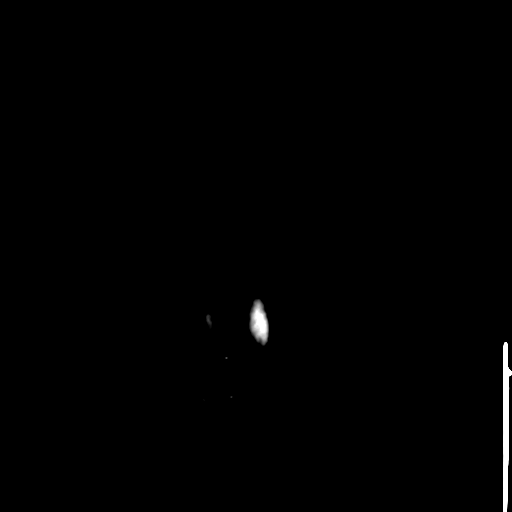
[im 27/29  bone]
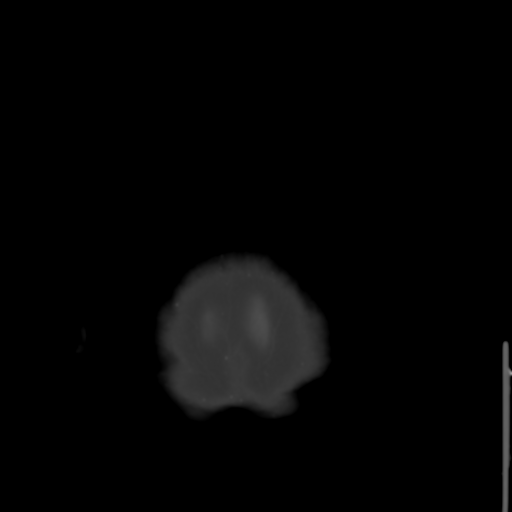

[Series 4: coronal soft tissue · coronal · 0.29mm/px · 3 of 64 slices shown]
[im 22/64  brain]
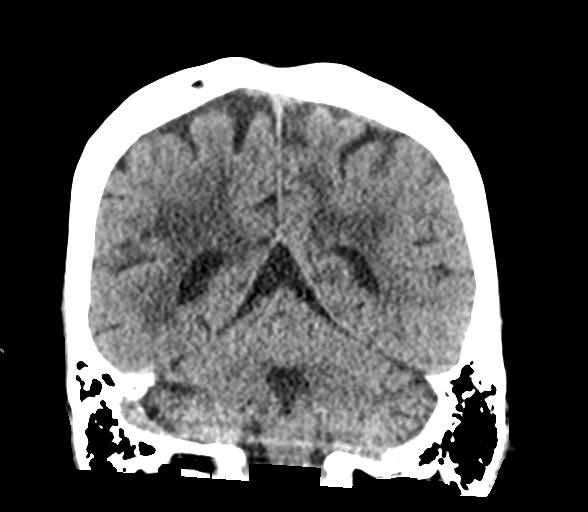
[im 29/64  brain]
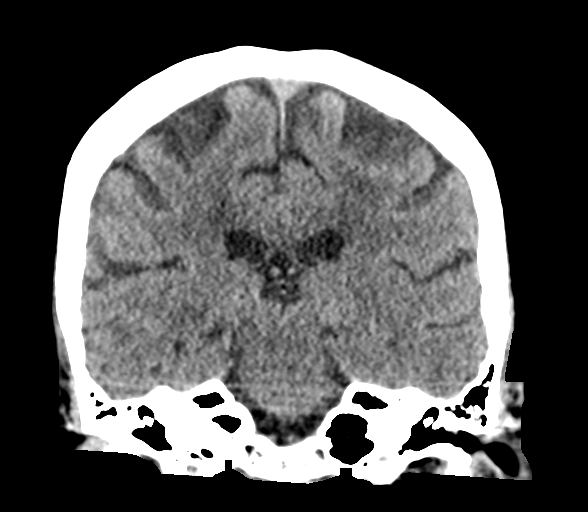
[im 36/64  brain]
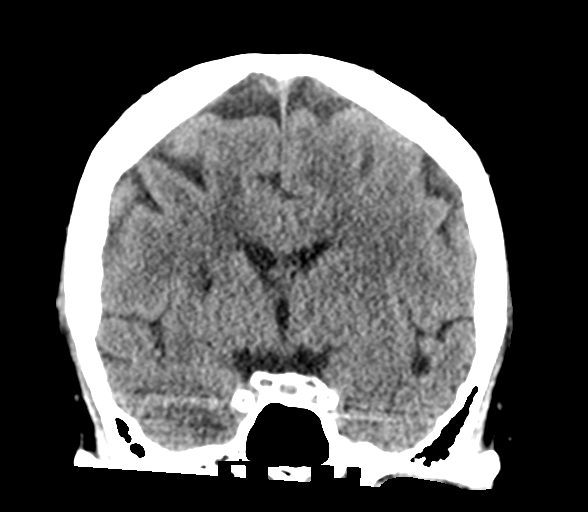

[Series 5: sagittal soft tissue · sagittal · 0.29mm/px · 3 of 52 slices shown]
[im 18/52  brain]
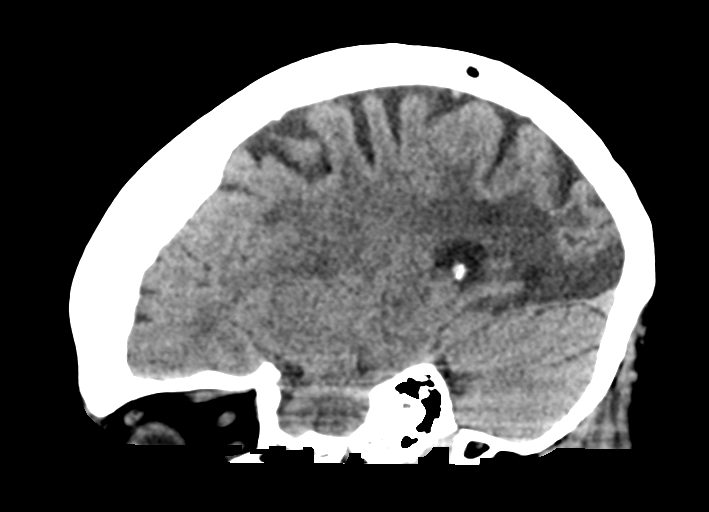
[im 26/52  brain]
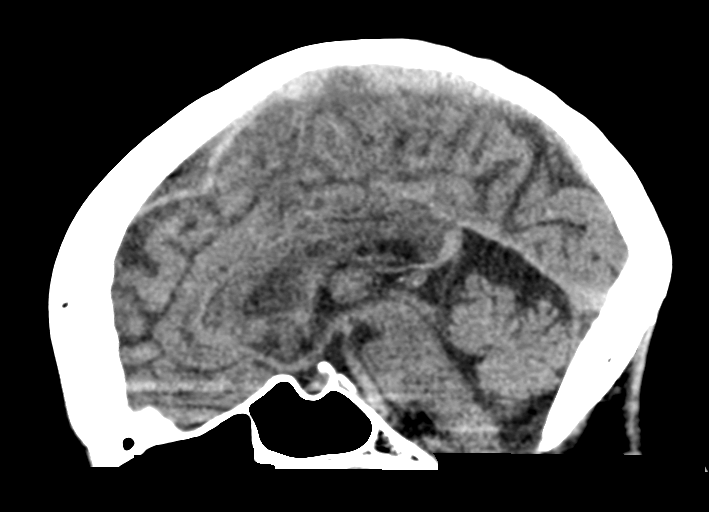
[im 35/52  brain]
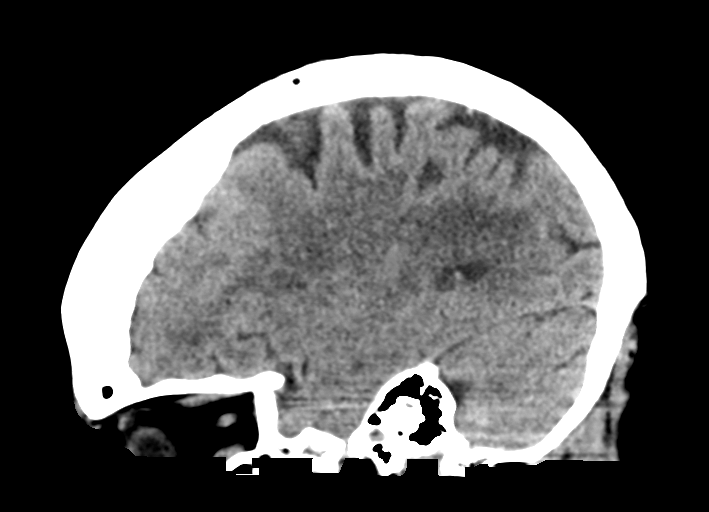

[15 of 46 positions shown; findings below may reference images not displayed]

FINDINGS: Brain: No acute hemorrhage. No subdural collection. Age related
atrophy. Moderate area of encephalomalacia involving the right
posterior parietooccipital lobe. Small area of encephalomalacia
involving the left parietooccipital lobe. Remote lacunar infarcts in
the basal ganglia and right thalamus. No evidence of acute ischemia.
Mild background chronic small vessel ischemia. No hydrocephalus,
midline shift, mass lesion/mass effect.

Vascular: Dense atherosclerosis at the skull base. No hyperdense
vessel.

Skull: No fracture or focal lesion.

Sinuses/Orbits: Opacification of right maxillary sinus is only
partially included. There is right frontal sinus opacification with
cortical thickening, chronic. Scattered mucosal thickening of
ethmoid air cells. Minimal frothy debris in left side of sphenoid
sinus. No mastoid effusion.

Other: None.
IMPRESSION: 1. No acute intracranial abnormality.
2. Age related atrophy and chronic small vessel ischemia. Remote
infarcts in the bilateral parietooccipital lobes and basal ganglia.
3. Chronic right frontal and maxillary sinusitis.

## 2021-03-08 IMAGING — CT CT ANGIO CHEST
3 of 7 series · 18 of 46 positions shown · IV contrast (APPLIED)
Comparison: None.

CLINICAL DATA: Left chest pain

EXAM:
CT ANGIOGRAPHY CHEST WITH CONTRAST
TECHNIQUE: Multidetector CT imaging of the chest was performed using the
standard protocol during bolus administration of intravenous
contrast. Multiplanar CT image reconstructions and MIPs were
obtained to evaluate the vascular anatomy.
CONTRAST:  75mL OMNIPAQUE IOHEXOL 350 MG/ML SOLN

[Series 5: axial arterial · axial · arterial · 0.64mm/px · z∈[-270,-63]mm · 8 of 89 slices shown]
[im 10/89  lung]
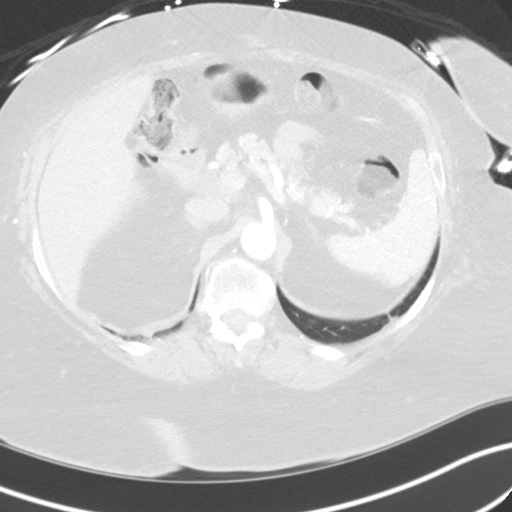
[im 20/89  soft-tissue]
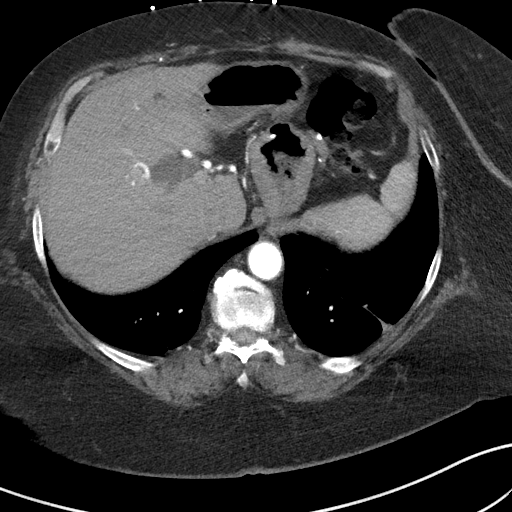
[im 30/89  lung]
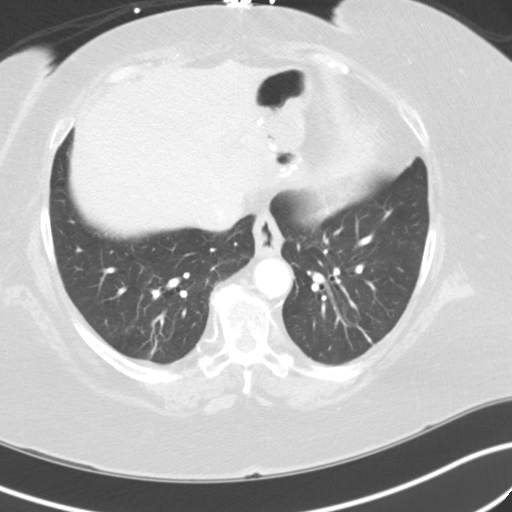
[im 40/89  soft-tissue]
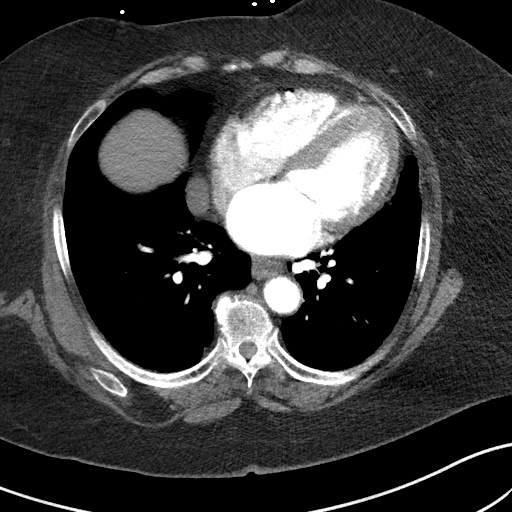
[im 49/89  lung]
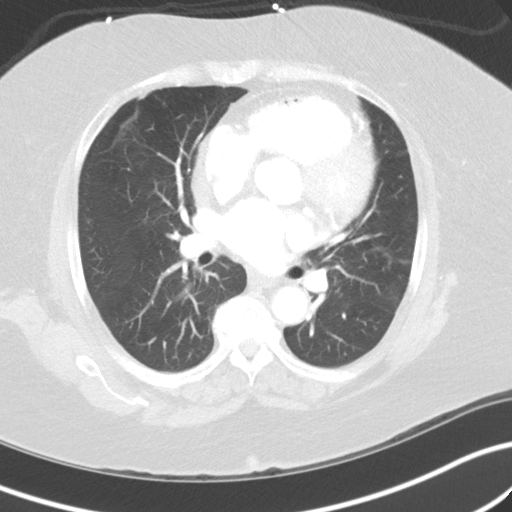
[im 59/89  soft-tissue]
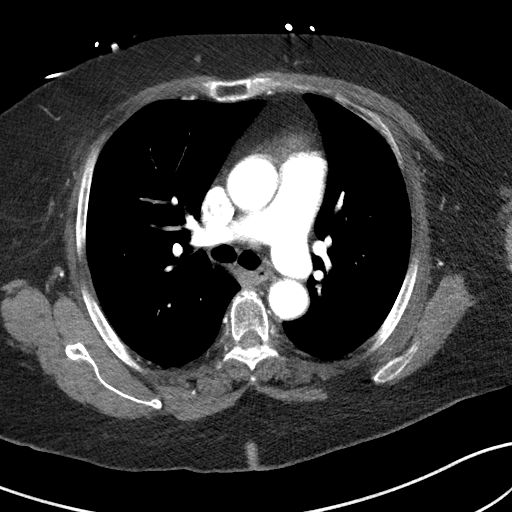
[im 69/89  lung]
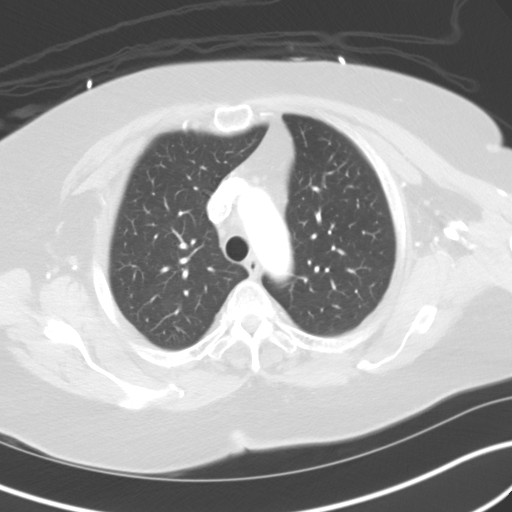
[im 79/89  soft-tissue]
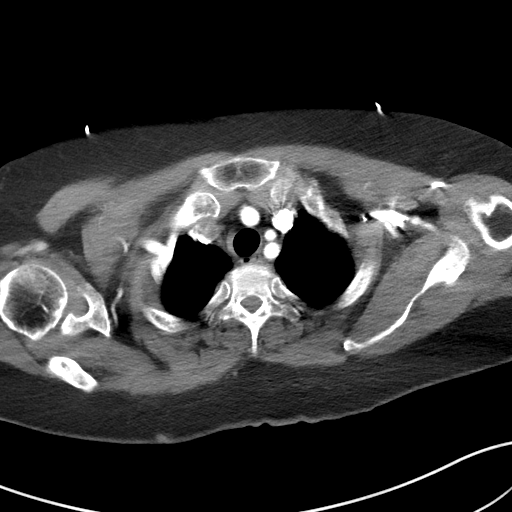

[Series 6: lung · axial · 0.64mm/px · z∈[-283,-87]mm · 7 of 134 slices shown]
[im 9/134  soft-tissue]
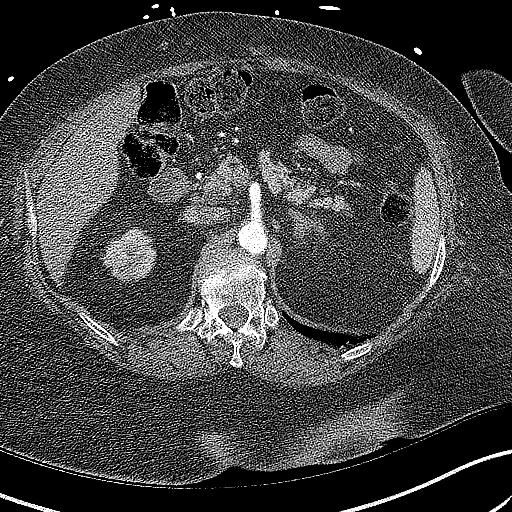
[im 27/134  soft-tissue]
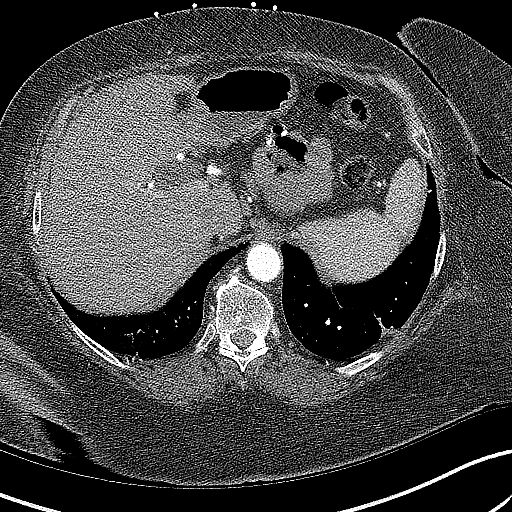
[im 45/134  soft-tissue]
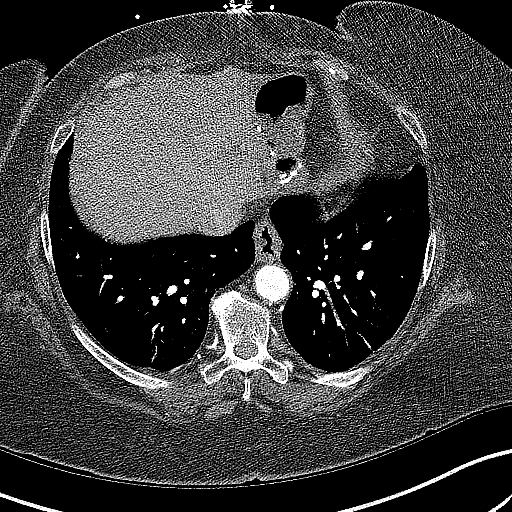
[im 63/134  soft-tissue]
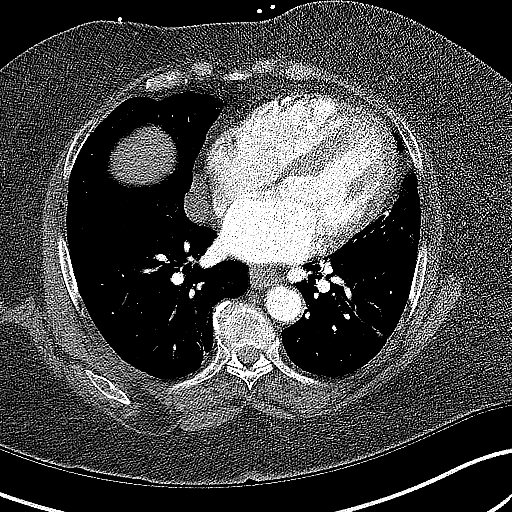
[im 71/134  soft-tissue]
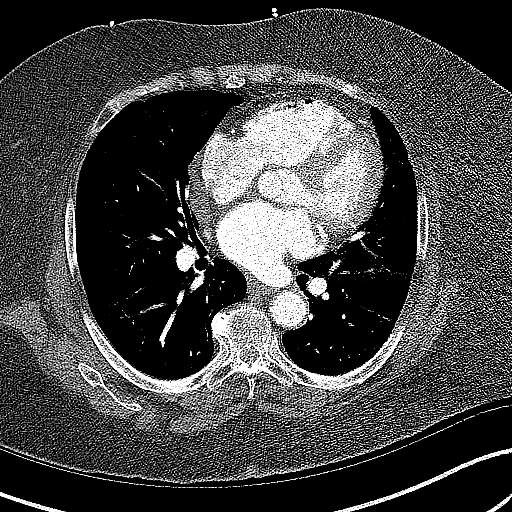
[im 89/134  soft-tissue]
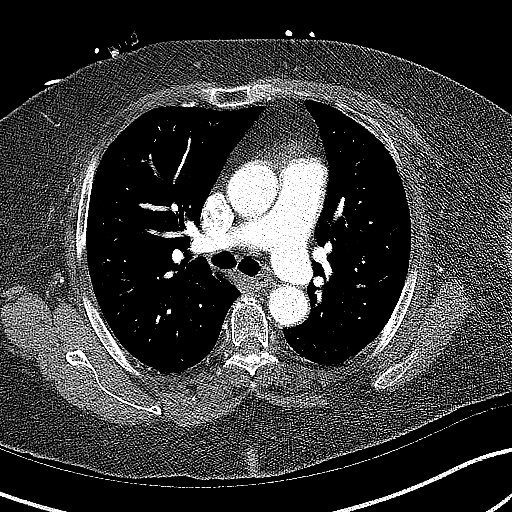
[im 107/134  soft-tissue]
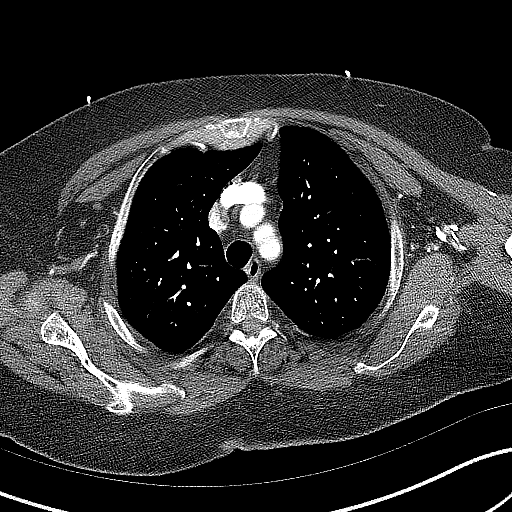

[Series 7: coronals · coronal · 0.58mm/px · 3 of 144 slices shown]
[im 36/144  soft-tissue]
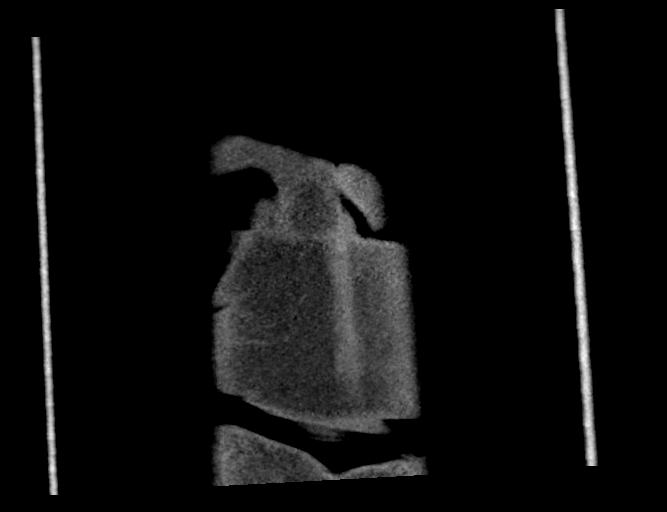
[im 72/144  soft-tissue]
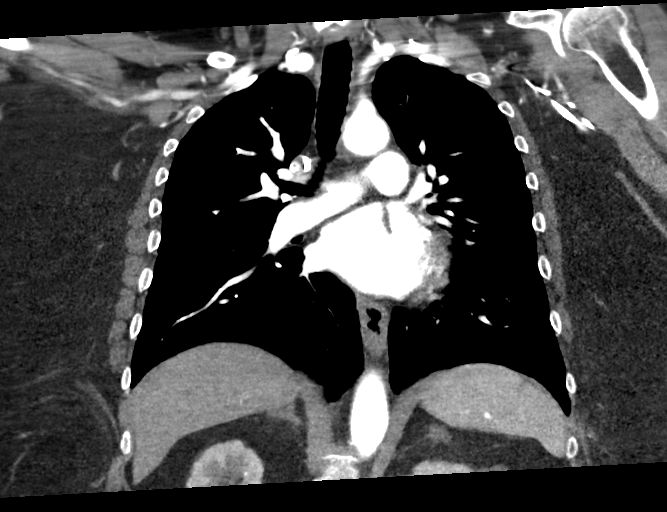
[im 108/144  soft-tissue]
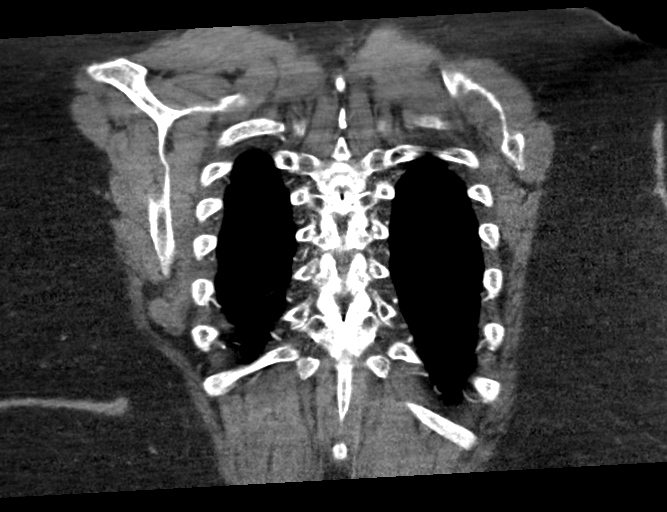

[18 of 46 positions shown; findings below may reference images not displayed]

FINDINGS: Cardiovascular: On unenhanced CT, there is no evidence of intramural
hematoma.

Following contrast administration, there is preferential
opacification of the thoracic aorta. No evidence of thoracic aortic
aneurysm or dissection.

Although not tailored for evaluation of the pulmonary arteries,
there is no evidence of pulmonary embolism to the lobar level.

The heart is normal in size.  No pericardial effusion.

Mediastinum/Nodes: No suspicious mediastinal lymphadenopathy.

Visualized thyroid is unremarkable.

Lungs/Pleura: Linear scarring/atelectasis in the lingula and
bilateral lower lobes.

No focal consolidation

No suspicious pulmonary nodules.

No pleural effusion or pneumothorax.

Upper Abdomen: Visualized upper abdomen is notable for prior
cholecystectomy. Mildly prominent common duct, likely postsurgical.
Suspected left renal cysts (series 5/image 89), incompletely
visualized.

Musculoskeletal: Degenerative changes of the visualized
thoracolumbar spine.

Review of the MIP images confirms the above findings.
IMPRESSION: No evidence of thoracic aortic aneurysm or dissection.

No evidence of pulmonary embolism.

No evidence of acute cardiopulmonary disease.

## 2021-03-08 MED ORDER — NITROGLYCERIN 0.4 MG SL SUBL
0.4000 mg | SUBLINGUAL_TABLET | SUBLINGUAL | Status: DC | PRN
  Administered 2021-03-08: 0.4 mg via SUBLINGUAL
  Filled 2021-03-08: qty 1

## 2021-03-08 MED ORDER — ACETAMINOPHEN 500 MG PO TABS
1000.0000 mg | ORAL_TABLET | Freq: Once | ORAL | Status: AC
  Administered 2021-03-08: 1000 mg via ORAL
  Filled 2021-03-08: qty 2

## 2021-03-08 MED ORDER — FENTANYL CITRATE PF 50 MCG/ML IJ SOSY
50.0000 ug | PREFILLED_SYRINGE | Freq: Once | INTRAMUSCULAR | Status: AC
  Administered 2021-03-08: 50 ug via INTRAVENOUS
  Filled 2021-03-08: qty 1

## 2021-03-08 MED ORDER — IOHEXOL 350 MG/ML SOLN
75.0000 mL | Freq: Once | INTRAVENOUS | Status: AC | PRN
  Administered 2021-03-08: 75 mL via INTRAVENOUS

## 2021-03-08 MED ORDER — ASPIRIN 81 MG PO CHEW
324.0000 mg | CHEWABLE_TABLET | Freq: Once | ORAL | Status: AC
  Administered 2021-03-08: 324 mg via ORAL
  Filled 2021-03-08: qty 4

## 2021-03-08 MED ORDER — LACTATED RINGERS IV BOLUS
500.0000 mL | Freq: Once | INTRAVENOUS | Status: AC
  Administered 2021-03-08: 500 mL via INTRAVENOUS

## 2021-03-08 MED ORDER — NITROGLYCERIN IN D5W 200-5 MCG/ML-% IV SOLN
0.0000 ug/min | INTRAVENOUS | Status: DC
  Administered 2021-03-08: 5 ug/min via INTRAVENOUS
  Administered 2021-03-09: 60 ug/min via INTRAVENOUS
  Filled 2021-03-08: qty 250

## 2021-03-08 NOTE — ED Provider Notes (Signed)
Emergency Medicine Provider Triage Evaluation Note  Susan Mclean , a 70 y.o. female  was evaluated in triage.  Pt complains of chest pain.  History of previous MI, feels similar.  Received nitro with EMS.  Positive shortness of breath.  No URI symptoms.Marland Kitchen  Positive emesis.  Review of Systems  Positive: Chest pain, Negative: emesis, URI symptoms  Physical Exam  BP (!) 218/102   Pulse 74   Temp 97.7 F (36.5 C) (Oral)   Resp (!) 22   Ht 5\' 1"  (1.549 m)   Wt 105.2 kg   SpO2 100%   BMI 43.84 kg/m  Gen:   Awake, no distress   Resp:  Normal effort  MSK:   Moves extremities without difficulty  Other:  No murmurs, rubs, gallops.  Medical Decision Making  Medically screening exam initiated at 6:00 PM.  Appropriate orders placed.  Destyn Parfitt was informed that the remainder of the evaluation will be completed by another provider, this initial triage assessment does not replace that evaluation, and the importance of remaining in the ED until their evaluation is complete.  Patient presents with chest pain, received nitro via EMS.  History of MI as well as PE.  Patient will have labs, x-ray, EKG   Guerry Minors Jarome Lamas 03/08/21 1800    Merlyn Lot, MD 03/08/21 1933

## 2021-03-08 NOTE — ED Triage Notes (Signed)
Pt brought in by [facility] with c/o left sided CP that started at 1400 today. It is reproducible with palpitation. She had N/V this am. States that the pain radiates to her stomach. She was given 1" of Nitro paste by EMS.

## 2021-03-08 NOTE — ED Notes (Signed)
ERMD at bedside at this time 

## 2021-03-08 NOTE — ED Notes (Signed)
Pt to radiology for ct at this time

## 2021-03-08 NOTE — ED Provider Notes (Signed)
[facility] Emergency Department Provider Note  ____________________________________________   Event Date/Time   First MD Initiated Contact with Patient 03/08/21 1756     (approximate)  I have reviewed the triage vital signs and the nursing notes.   HISTORY  Chief Complaint Chest Pain   HPI Susan Mclean is a 70 y.o. female with a past medical history of CVA, CAD as well as previous MI, paroxysmal A. fib on Eliquis, CHF, HTN and DM as well as HDL who presents for assessment of some chest pain that began earlier this afternoon associated with some nausea and vomiting, headache and some discomfort in the back and left arm.  She denies any falls or injuries.  States he vomited some of her blood pressure medicines.  She denies any falls or injuries, tobacco abuse, EtOH use or illicit drug use.  She denies any vision changes, vertigo, cough, fevers, shortness of breath, diarrhea, burning with admission, abdominal pain, rash or extremity pain.  She states nitroglycerin paste she received from EMS helped her chest pain a little bit.         Past Medical History:  Diagnosis Date   Hypertension     There are no problems to display for this patient.     Prior to Admission medications   Not on File    Allergies Patient has no allergy information on record.  No family history on file.  Social History Social History   Substance Use Topics   Alcohol use: Not Currently   Drug use: Not Currently    Review of Systems  Review of Systems  Constitutional:  Negative for chills and fever.  HENT:  Negative for sore throat.   Eyes:  Negative for pain.  Respiratory:  Negative for cough and stridor.   Cardiovascular:  Positive for chest pain.  Gastrointestinal:  Positive for nausea and vomiting.  Genitourinary:  Negative for dysuria.  Musculoskeletal:  Positive for back pain.  Skin:  Negative for rash.  Neurological:  Positive for headaches. Negative for  seizures and loss of consciousness.  Psychiatric/Behavioral:  Negative for suicidal ideas.   All other systems reviewed and are negative.    ____________________________________________   PHYSICAL EXAM:  VITAL SIGNS: ED Triage Vitals  Enc Vitals Group     BP 03/08/21 1748 (!) 218/102     Pulse Rate 03/08/21 1748 74     Resp 03/08/21 1748 (!) 22     Temp 03/08/21 1748 97.7 F (36.5 C)     Temp Source 03/08/21 1748 Oral     SpO2 03/08/21 1748 100 %     Weight 03/08/21 1749 232 lb (105.2 kg)     Height 03/08/21 1749 5\' 1"  (1.549 m)     Head Circumference --      Peak Flow --      Pain Score 03/08/21 1749 10     Pain Loc --      Pain Edu? --      Excl. in [place]? --    Vitals:   03/08/21 2245 03/08/21 2310  BP: (!) 166/86 (!) 154/83  Pulse: 71 70  Resp: 18 15  Temp:    SpO2: 100% 100%   Physical Exam Vitals and nursing note reviewed.  Constitutional:      General: She is not in acute distress.    Appearance: She is well-developed. She is obese.  HENT:     Head: Normocephalic and atraumatic.     Right Ear: External ear  normal.     Left Ear: External ear normal.     Nose: Nose normal.  Eyes:     Conjunctiva/sclera: Conjunctivae normal.  Cardiovascular:     Rate and Rhythm: Normal rate and regular rhythm.     Heart sounds: No murmur heard. Pulmonary:     Effort: Pulmonary effort is normal. No respiratory distress.     Breath sounds: Normal breath sounds.  Abdominal:     Palpations: Abdomen is soft.     Tenderness: There is no abdominal tenderness.  Musculoskeletal:     Cervical back: Neck supple.  Skin:    General: Skin is warm and dry.     Capillary Refill: Capillary refill takes less than 2 seconds.  Neurological:     Mental Status: She is alert and oriented to person, place, and time.  Psychiatric:        Mood and Affect: Mood normal.    Cranial nerves II through XII grossly intact.  No pronator drift.  No finger dysmetria.  Symmetric 5/5 strength of all  extremities.  Sensation intact to light touch in all extremities.  ____________________________________________   LABS (all labs ordered are listed, but only abnormal results are displayed)  Labs Reviewed  BASIC METABOLIC PANEL - Abnormal; Notable for the following components:      Result Value   CO2 16 (*)    Glucose, Bld 154 (*)    BUN 36 (*)    Creatinine, Ser 2.59 (*)    GFR, Estimated 19 (*)    All other components within normal limits  CBC - Abnormal; Notable for the following components:   Hemoglobin 11.7 (*)    HCT 34.2 (*)    All other components within normal limits  BRAIN NATRIURETIC PEPTIDE - Abnormal; Notable for the following components:   B Natriuretic Peptide 215.2 (*)    All other components within normal limits  HEPATIC FUNCTION PANEL - Abnormal; Notable for the following components:   Total Protein 9.0 (*)    Albumin 3.3 (*)    Alkaline Phosphatase 139 (*)    All other components within normal limits  TROPONIN I (HIGH SENSITIVITY) - Abnormal; Notable for the following components:   Troponin I (High Sensitivity) 171 (*)    All other components within normal limits  TROPONIN I (HIGH SENSITIVITY) - Abnormal; Notable for the following components:   Troponin I (High Sensitivity) 1,735 (*)    All other components within normal limits  RESP PANEL BY RT-PCR (FLU A&B, COVID) ARPGX2  MAGNESIUM   ____________________________________________  EKG Sinus rhythm with a ventricular rate of 68, normal axis, unremarkable intervals with some nonspecific ST change in lead III without other clear evidence of acute ischemia or significant arrhythmia.  Repeat EKG with some ST depressions in V5 and V6 as well as lead II and aVF's that appear new.  No other acute changes noted. ____________________________________________  RADIOLOGY  ED MD interpretation: Chest x-ray without evidence of focal consolidation, effusion, edema, pneumothorax or other acute thoracic process.  CT  head remarkable for evidence of age-related atrophy and chronic small vessel ischemia as well as remote parietal occipital infarcts without evidence of hemorrhage or acute ischemia.  Official radiology report(s): DG Chest 2 View  Result Date: 03/08/2021 CLINICAL DATA:  Chest pain.  Emesis. EXAM: CHEST - 2 VIEW COMPARISON:  Chest x-ray 03/03/2011. FINDINGS: The heart and mediastinal contours are within normal limits. Coronary artery stent. Aortic calcification. No focal consolidation. No pulmonary edema. No pleural effusion.  No pneumothorax. No acute osseous abnormality. IMPRESSION: No active cardiopulmonary disease. Electronically Signed   By: Iven Finn M.D.   On: 03/08/2021 18:21   CT HEAD WO CONTRAST (5MM)  Result Date: 03/08/2021 CLINICAL DATA:  Headache, intracranial hemorrhage suspected EXAM: CT HEAD WITHOUT CONTRAST TECHNIQUE: Contiguous axial images were obtained from the base of the skull through the vertex without intravenous contrast. COMPARISON:  None. FINDINGS: Brain: No acute hemorrhage. No subdural collection. Age related atrophy. Moderate area of encephalomalacia involving the right posterior parietooccipital lobe. Small area of encephalomalacia involving the left parietooccipital lobe. Remote lacunar infarcts in the basal ganglia and right thalamus. No evidence of acute ischemia. Mild background chronic small vessel ischemia. No hydrocephalus, midline shift, mass lesion/mass effect. Vascular: Dense atherosclerosis at the skull base. No hyperdense vessel. Skull: No fracture or focal lesion. Sinuses/Orbits: Opacification of right maxillary sinus is only partially included. There is right frontal sinus opacification with cortical thickening, chronic. Scattered mucosal thickening of ethmoid air cells. Minimal frothy debris in left side of sphenoid sinus. No mastoid effusion. Other: None. IMPRESSION: 1. No acute intracranial abnormality. 2. Age related atrophy and chronic small vessel  ischemia. Remote infarcts in the bilateral parietooccipital lobes and basal ganglia. 3. Chronic right frontal and maxillary sinusitis. Electronically Signed   By: Keith Rake M.D.   On: 03/08/2021 21:34    ____________________________________________   PROCEDURES  Procedure(s) performed (including Critical Care):  .Critical Care Performed by: Lucrezia Starch, MD Authorized by: Lucrezia Starch, MD   Critical care provider statement:    Critical care time (minutes):  45   Critical care was necessary to treat or prevent imminent or life-threatening deterioration of the following conditions:  Cardiac failure   Critical care was time spent personally by me on the following activities:  Pulse oximetry, ordering and review of laboratory studies, ordering and performing treatments and interventions, review of old charts, re-evaluation of patient's condition, examination of patient, evaluation of patient's response to treatment, discussions with consultants, development of treatment plan with patient or surrogate and obtaining history from patient or surrogate   I assumed direction of critical care for this patient from another provider in my specialty: no     ____________________________________________   INITIAL IMPRESSION / [place] / ED COURSE      Patient presents with above-stated history exam for assessment of cute onset of some chest pain rating to the back and left arm associated with headache and some nausea and vomiting.  On arrival patient is hypertensive with BP of 218/102 and slightly tachypneic with otherwise stable vital signs on room air.  She has a nonfocal neurological exam not consistent with ischemic CVA.  With regard to her headache concern for possible hypertensive urgency versus subarachnoid hemorrhage.  CT head obtained shows no evidence of hemorrhage and I suspect headache likely related to elevated blood pressures.  With regard to her chest  discomfort differential clues ACS, arrhythmia, PE, pneumonia, pneumothorax, myocarditis, pericarditis, dissection and perforated viscus.  Sinus rhythm with a ventricular rate of 68, normal axis, unremarkable intervals with some nonspecific ST change in lead III without other clear evidence of acute ischemia or significant arrhythmia.  Repeat EKG with some ST depressions in V5 and V6 as well as lead II and aVF's that appear new.  No other acute changes noted.  Chest x-ray without evidence of focal consolidation, effusion, edema, pneumothorax or other acute thoracic process.  CT head remarkable for evidence of age-related atrophy and chronic  small vessel ischemia as well as remote parietal occipital infarcts without evidence of hemorrhage or acute ischemia.  BMP remarkable for bicarb of 16, glucose of 154, BUN of 36 and a creatinine of 2.59 with an anion gap of 11.  CBC shows no leukocytosis and hemoglobin of 11.7.  Platelets are normal.  Morning did increase from [phone].  COVID influenza PCR is negative.  BNP slightly elevated to 15.2.  Hepatic function panel shows slight elevated T protein but otherwise grossly unremarkable.  90 seems within normalized.  Patient started on nitro drip due to ongoing chest discomfort after some fentanyl and sublingual nitroglycerin.  Discussed patient's presentation concern for NSTEMI with ongoing chest discomfort after nitro with interventional cardiologist Dr. Saunders Revel who recommended medical management at this time.  Also discussed with on-call noninterventional is Dr. Rayann Heman who had no additional recommendations other than nitroglycerin at this time.  Will obtain a CTA to rule out dissection.  Care patient signed over to assuming provider at approximately 2300.  Plan is to follow-up CTA and admit pending results of this with concern for an NSTEMI.  Heparin deferred given concern for possible dissection and patient reporting she is on Eliquis.       ____________________________________________   FINAL CLINICAL IMPRESSION(S) / ED DIAGNOSES  Final diagnoses:  NSTEMI (non-ST elevated myocardial infarction) ([place])  Hypertensive emergency  Nonintractable headache, unspecified chronicity pattern, unspecified headache type  Nausea and vomiting, unspecified vomiting type    Medications  nitroGLYCERIN (NITROSTAT) SL tablet 0.4 mg (0.4 mg Sublingual Given 03/08/21 2116)  nitroGLYCERIN 50 mg in dextrose 5 % 250 mL (0.2 mg/mL) infusion (10 mcg/min Intravenous Rate/Dose Change 03/08/21 2311)  acetaminophen (TYLENOL) tablet 1,000 mg (1,000 mg Oral Given 03/08/21 2116)  fentaNYL (SUBLIMAZE) injection 50 mcg (50 mcg Intravenous Given 03/08/21 2117)  lactated ringers bolus 500 mL (500 mLs Intravenous New Bag/Given 03/08/21 2123)  aspirin chewable tablet 324 mg (324 mg Oral Given 03/08/21 2245)     ED Discharge Orders     None        Note:  This document was prepared using Dragon voice recognition software and may include unintentional dictation errors.    Lucrezia Starch, MD 03/08/21 [phone]

## 2021-03-08 NOTE — ED Triage Notes (Signed)
Pt to ED [facility] from home for centralized chest pain starting at 1400 radiating to back. +emesis Denies shob  Pt in NAD Hx MI, HTN  Pt states currently on eliquis on for blood clot in lungs 2 years ago

## 2021-03-09 ENCOUNTER — Other Ambulatory Visit: Payer: Self-pay

## 2021-03-09 ENCOUNTER — Encounter
Admission: EM | Disposition: A | Discharge status: Home / Self Care | Payer: Self-pay | Source: Home / Self Care | Attending: Internal Medicine

## 2021-03-09 ENCOUNTER — Inpatient Hospital Stay (HOSPITAL_COMMUNITY)
Admit: 2021-03-09 | Discharge: 2021-03-09 | Disposition: A | Discharge status: Home / Self Care | Payer: 59 | Attending: Internal Medicine | Admitting: Internal Medicine

## 2021-03-09 ENCOUNTER — Encounter: Payer: Self-pay | Admitting: Internal Medicine

## 2021-03-09 DIAGNOSIS — T508X5A Adverse effect of diagnostic agents, initial encounter: Secondary | ICD-10-CM | POA: Diagnosis not present

## 2021-03-09 DIAGNOSIS — Z7901 Long term (current) use of anticoagulants: Secondary | ICD-10-CM

## 2021-03-09 DIAGNOSIS — I509 Heart failure, unspecified: Secondary | ICD-10-CM | POA: Diagnosis not present

## 2021-03-09 DIAGNOSIS — N184 Chronic kidney disease, stage 4 (severe): Secondary | ICD-10-CM | POA: Diagnosis present

## 2021-03-09 DIAGNOSIS — I214 Non-ST elevation (NSTEMI) myocardial infarction: Principal | ICD-10-CM

## 2021-03-09 DIAGNOSIS — Z8673 Personal history of transient ischemic attack (TIA), and cerebral infarction without residual deficits: Secondary | ICD-10-CM | POA: Diagnosis not present

## 2021-03-09 DIAGNOSIS — I252 Old myocardial infarction: Secondary | ICD-10-CM | POA: Diagnosis not present

## 2021-03-09 DIAGNOSIS — E119 Type 2 diabetes mellitus without complications: Secondary | ICD-10-CM

## 2021-03-09 DIAGNOSIS — I130 Hypertensive heart and chronic kidney disease with heart failure and stage 1 through stage 4 chronic kidney disease, or unspecified chronic kidney disease: Secondary | ICD-10-CM | POA: Diagnosis present

## 2021-03-09 DIAGNOSIS — I10 Essential (primary) hypertension: Secondary | ICD-10-CM

## 2021-03-09 DIAGNOSIS — I161 Hypertensive emergency: Secondary | ICD-10-CM | POA: Diagnosis present

## 2021-03-09 DIAGNOSIS — E1159 Type 2 diabetes mellitus with other circulatory complications: Secondary | ICD-10-CM | POA: Diagnosis not present

## 2021-03-09 DIAGNOSIS — E1169 Type 2 diabetes mellitus with other specified complication: Secondary | ICD-10-CM | POA: Diagnosis present

## 2021-03-09 DIAGNOSIS — I4891 Unspecified atrial fibrillation: Secondary | ICD-10-CM

## 2021-03-09 DIAGNOSIS — I959 Hypotension, unspecified: Secondary | ICD-10-CM | POA: Diagnosis not present

## 2021-03-09 DIAGNOSIS — N1411 Contrast-induced nephropathy: Secondary | ICD-10-CM | POA: Diagnosis not present

## 2021-03-09 DIAGNOSIS — Z20822 Contact with and (suspected) exposure to covid-19: Secondary | ICD-10-CM | POA: Diagnosis present

## 2021-03-09 DIAGNOSIS — I2510 Atherosclerotic heart disease of native coronary artery without angina pectoris: Secondary | ICD-10-CM

## 2021-03-09 DIAGNOSIS — Z6836 Body mass index (BMI) 36.0-36.9, adult: Secondary | ICD-10-CM | POA: Diagnosis not present

## 2021-03-09 DIAGNOSIS — I25118 Atherosclerotic heart disease of native coronary artery with other forms of angina pectoris: Secondary | ICD-10-CM | POA: Diagnosis not present

## 2021-03-09 DIAGNOSIS — I480 Paroxysmal atrial fibrillation: Secondary | ICD-10-CM | POA: Diagnosis present

## 2021-03-09 DIAGNOSIS — N179 Acute kidney failure, unspecified: Secondary | ICD-10-CM | POA: Diagnosis not present

## 2021-03-09 DIAGNOSIS — N170 Acute kidney failure with tubular necrosis: Secondary | ICD-10-CM | POA: Diagnosis not present

## 2021-03-09 DIAGNOSIS — E785 Hyperlipidemia, unspecified: Secondary | ICD-10-CM

## 2021-03-09 DIAGNOSIS — E1122 Type 2 diabetes mellitus with diabetic chronic kidney disease: Secondary | ICD-10-CM | POA: Diagnosis present

## 2021-03-09 DIAGNOSIS — N189 Chronic kidney disease, unspecified: Secondary | ICD-10-CM | POA: Diagnosis not present

## 2021-03-09 HISTORY — PX: LEFT HEART CATH AND CORONARY ANGIOGRAPHY: CATH118249

## 2021-03-09 HISTORY — PX: CORONARY STENT INTERVENTION: CATH118234

## 2021-03-09 LAB — BASIC METABOLIC PANEL
Anion gap: 8 (ref 5–15)
BUN: 33 mg/dL — ABNORMAL HIGH (ref 8–23)
CO2: 19 mmol/L — ABNORMAL LOW (ref 22–32)
Calcium: 9 mg/dL (ref 8.9–10.3)
Chloride: 109 mmol/L (ref 98–111)
Creatinine, Ser: 2.35 mg/dL — ABNORMAL HIGH (ref 0.44–1.00)
GFR, Estimated: 22 mL/min — ABNORMAL LOW (ref >60)
Glucose, Bld: 126 mg/dL — ABNORMAL HIGH (ref 70–99)
Potassium: 4.7 mmol/L (ref 3.5–5.1)
Sodium: 136 mmol/L (ref 135–145)

## 2021-03-09 LAB — GLUCOSE, CAPILLARY
Glucose-Capillary: 115 mg/dL — ABNORMAL HIGH (ref 70–99)
Glucose-Capillary: 92 mg/dL (ref 70–99)
Glucose-Capillary: 100 mg/dL — ABNORMAL HIGH (ref 70–99)
Glucose-Capillary: 100 mg/dL — ABNORMAL HIGH (ref 70–99)

## 2021-03-09 LAB — MRSA NEXT GEN BY PCR, NASAL: MRSA by PCR Next Gen: NOT DETECTED

## 2021-03-09 LAB — TROPONIN I (HIGH SENSITIVITY)
Troponin I (High Sensitivity): >24000 ng/L
Troponin I (High Sensitivity): >24000 ng/L (ref <18)

## 2021-03-09 LAB — ECHOCARDIOGRAM COMPLETE
AR max vel: 2.45 cm2
AV Area VTI: 2.48 cm2
AV Area mean vel: 2.38 cm2
AV Mean grad: 2 mmHg
AV Peak grad: 3.8 mmHg
Ao pk vel: 0.98 m/s
Area-P 1/2: 3.28 cm2
Height: 61 in
MV VTI: 2.89 cm2
S' Lateral: 2.4 cm
Weight: 3093.49 oz

## 2021-03-09 LAB — PROTIME-INR
INR: 1.2 (ref 0.8–1.2)
Prothrombin Time: 14.8 seconds (ref 11.4–15.2)

## 2021-03-09 LAB — POCT ACTIVATED CLOTTING TIME
Activated Clotting Time: 329 s
Activated Clotting Time: 318 seconds

## 2021-03-09 LAB — CBC
HCT: 33.9 % — ABNORMAL LOW (ref 36.0–46.0)
Hemoglobin: 11.8 g/dL — ABNORMAL LOW (ref 12.0–15.0)
MCH: 29.7 pg (ref 26.0–34.0)
MCHC: 34.8 g/dL (ref 30.0–36.0)
MCV: 85.4 fL (ref 80.0–100.0)
Platelets: 169 10*3/uL (ref 150–400)
RBC: 3.97 MIL/uL (ref 3.87–5.11)
RDW: 14.7 % (ref 11.5–15.5)
WBC: 5.9 10*3/uL (ref 4.0–10.5)
nRBC: 0 % (ref 0.0–0.2)

## 2021-03-09 LAB — HEMOGLOBIN A1C
Hgb A1c MFr Bld: 6.2 % — ABNORMAL HIGH (ref 4.8–5.6)
Mean Plasma Glucose: 131.24 mg/dL

## 2021-03-09 LAB — HEPARIN LEVEL (UNFRACTIONATED): Heparin Unfractionated: 0.72 IU/mL — ABNORMAL HIGH (ref 0.30–0.70)

## 2021-03-09 LAB — APTT: aPTT: 58 seconds — ABNORMAL HIGH (ref 24–36)

## 2021-03-09 SURGERY — CORONARY/GRAFT ACUTE MI REVASCULARIZATION
Anesthesia: Moderate Sedation

## 2021-03-09 SURGERY — LEFT HEART CATH AND CORONARY ANGIOGRAPHY
Anesthesia: Moderate Sedation

## 2021-03-09 MED ORDER — HEPARIN SODIUM (PORCINE) 1000 UNIT/ML IJ SOLN
INTRAMUSCULAR | Status: AC
  Filled 2021-03-09: qty 1

## 2021-03-09 MED ORDER — HEPARIN (PORCINE) IN NACL 1000-0.9 UT/500ML-% IV SOLN
INTRAVENOUS | Status: AC
  Filled 2021-03-09: qty 1000

## 2021-03-09 MED ORDER — ACETAMINOPHEN 650 MG RE SUPP: 650.0000 mg | Freq: Four times a day (QID) | RECTAL | Status: DC | PRN

## 2021-03-09 MED ORDER — FENTANYL CITRATE (PF) 100 MCG/2ML IJ SOLN
INTRAMUSCULAR | Status: DC | PRN
  Administered 2021-03-09: 25 ug via INTRAVENOUS

## 2021-03-09 MED ORDER — HEPARIN (PORCINE) 25000 UT/250ML-% IV SOLN
1150.0000 [IU]/h | INTRAVENOUS | Status: DC
  Filled 2021-03-09: qty 250

## 2021-03-09 MED ORDER — ACETAMINOPHEN 325 MG PO TABS
650.0000 mg | ORAL_TABLET | ORAL | Status: DC | PRN
  Administered 2021-03-09 – 2021-03-13 (×3): 650 mg via ORAL
  Filled 2021-03-09 (×3): qty 2

## 2021-03-09 MED ORDER — VERAPAMIL HCL 2.5 MG/ML IV SOLN
INTRAVENOUS | Status: AC
  Filled 2021-03-09: qty 2

## 2021-03-09 MED ORDER — ONDANSETRON HCL 4 MG/2ML IJ SOLN: 4.0000 mg | Freq: Four times a day (QID) | INTRAMUSCULAR | Status: DC | PRN

## 2021-03-09 MED ORDER — PERFLUTREN LIPID MICROSPHERE
1.0000 mL | INTRAVENOUS | Status: AC | PRN
  Administered 2021-03-09: 2 mL via INTRAVENOUS
  Filled 2021-03-09: qty 10

## 2021-03-09 MED ORDER — ATORVASTATIN CALCIUM 20 MG PO TABS
80.0000 mg | ORAL_TABLET | Freq: Every day | ORAL | Status: DC
  Administered 2021-03-09 – 2021-03-13 (×5): 80 mg via ORAL
  Filled 2021-03-09 (×6): qty 4

## 2021-03-09 MED ORDER — HEPARIN (PORCINE) IN NACL 1000-0.9 UT/500ML-% IV SOLN
INTRAVENOUS | Status: DC | PRN
  Administered 2021-03-09: 1000 mL

## 2021-03-09 MED ORDER — MIDAZOLAM HCL 2 MG/2ML IJ SOLN
INTRAMUSCULAR | Status: DC | PRN
  Administered 2021-03-09: 1 mg via INTRAVENOUS

## 2021-03-09 MED ORDER — SODIUM CHLORIDE 0.9 % IV SOLN: 250.0000 mL | INTRAVENOUS | Status: DC | PRN

## 2021-03-09 MED ORDER — NITROGLYCERIN 1 MG/10 ML FOR IR/CATH LAB
INTRA_ARTERIAL | Status: DC | PRN
  Administered 2021-03-09 (×2): 200 ug via INTRACORONARY

## 2021-03-09 MED ORDER — SODIUM CHLORIDE 0.9% FLUSH
3.0000 mL | Freq: Two times a day (BID) | INTRAVENOUS | Status: DC
  Administered 2021-03-09 – 2021-03-13 (×8): 3 mL via INTRAVENOUS

## 2021-03-09 MED ORDER — ONDANSETRON HCL 4 MG/2ML IJ SOLN: 4.0000 mg | Freq: Once | INTRAMUSCULAR | Status: AC

## 2021-03-09 MED ORDER — ENOXAPARIN SODIUM 40 MG/0.4ML IJ SOSY: 40.0000 mg | PREFILLED_SYRINGE | INTRAMUSCULAR | Status: DC

## 2021-03-09 MED ORDER — ASPIRIN 81 MG PO CHEW: 81.0000 mg | CHEWABLE_TABLET | ORAL | Status: DC

## 2021-03-09 MED ORDER — INSULIN ASPART 100 UNIT/ML IJ SOLN: 0.0000 [IU] | Freq: Three times a day (TID) | INTRAMUSCULAR | Status: DC

## 2021-03-09 MED ORDER — PANTOPRAZOLE SODIUM 20 MG PO TBEC
20.0000 mg | DELAYED_RELEASE_TABLET | Freq: Every day | ORAL | Status: DC
  Administered 2021-03-09 – 2021-03-13 (×5): 20 mg via ORAL
  Filled 2021-03-09 (×5): qty 1

## 2021-03-09 MED ORDER — IOHEXOL 350 MG/ML SOLN
INTRAVENOUS | Status: DC | PRN
  Administered 2021-03-09: 72 mL via INTRA_ARTERIAL

## 2021-03-09 MED ORDER — LIDOCAINE HCL (PF) 1 % IJ SOLN
INTRAMUSCULAR | Status: DC | PRN
  Administered 2021-03-09: 2 mL

## 2021-03-09 MED ORDER — ACETAMINOPHEN 325 MG PO TABS: 650.0000 mg | ORAL_TABLET | Freq: Four times a day (QID) | ORAL | Status: DC | PRN

## 2021-03-09 MED ORDER — FENTANYL CITRATE (PF) 100 MCG/2ML IJ SOLN
INTRAMUSCULAR | Status: AC
  Filled 2021-03-09: qty 2

## 2021-03-09 MED ORDER — SODIUM CHLORIDE 0.9 % IV SOLN: INTRAVENOUS | Status: AC

## 2021-03-09 MED ORDER — SODIUM CHLORIDE 0.9% FLUSH: 3.0000 mL | INTRAVENOUS | Status: DC | PRN

## 2021-03-09 MED ORDER — CARVEDILOL 3.125 MG PO TABS
3.1250 mg | ORAL_TABLET | Freq: Two times a day (BID) | ORAL | Status: DC
  Administered 2021-03-09 – 2021-03-10 (×2): 3.125 mg via ORAL
  Filled 2021-03-09 (×3): qty 1

## 2021-03-09 MED ORDER — INSULIN ASPART 100 UNIT/ML IJ SOLN: 0.0000 [IU] | Freq: Every day | INTRAMUSCULAR | Status: DC

## 2021-03-09 MED ORDER — ONDANSETRON HCL 4 MG PO TABS: 4.0000 mg | ORAL_TABLET | Freq: Four times a day (QID) | ORAL | Status: DC | PRN

## 2021-03-09 MED ORDER — VERAPAMIL HCL 2.5 MG/ML IV SOLN
INTRAVENOUS | Status: DC | PRN
  Administered 2021-03-09: 2.5 mg via INTRAVENOUS

## 2021-03-09 MED ORDER — HYDRALAZINE HCL 20 MG/ML IJ SOLN: 10.0000 mg | INTRAMUSCULAR | Status: AC | PRN

## 2021-03-09 MED ORDER — NITROGLYCERIN IN D5W 200-5 MCG/ML-% IV SOLN
0.0000 ug/min | INTRAVENOUS | Status: DC
  Administered 2021-03-09: 60 ug/min via INTRAVENOUS

## 2021-03-09 MED ORDER — LIDOCAINE HCL 1 % IJ SOLN
INTRAMUSCULAR | Status: AC
  Filled 2021-03-09: qty 20

## 2021-03-09 MED ORDER — CLOPIDOGREL BISULFATE 75 MG PO TABS
75.0000 mg | ORAL_TABLET | Freq: Every day | ORAL | Status: DC
  Administered 2021-03-10 – 2021-03-13 (×4): 75 mg via ORAL
  Filled 2021-03-09 (×5): qty 1

## 2021-03-09 MED ORDER — SODIUM CHLORIDE 0.9 % IV SOLN: INTRAVENOUS | Status: DC

## 2021-03-09 MED ORDER — HEPARIN (PORCINE) 25000 UT/250ML-% IV SOLN
1150.0000 [IU]/h | INTRAVENOUS | Status: DC
  Administered 2021-03-09: 1150 [IU]/h via INTRAVENOUS

## 2021-03-09 MED ORDER — ASPIRIN 81 MG PO CHEW
81.0000 mg | CHEWABLE_TABLET | Freq: Every day | ORAL | Status: DC
  Administered 2021-03-09 – 2021-03-13 (×5): 81 mg via ORAL
  Filled 2021-03-09 (×6): qty 1

## 2021-03-09 MED ORDER — CLOPIDOGREL BISULFATE 75 MG PO TABS
ORAL_TABLET | ORAL | Status: DC | PRN
  Administered 2021-03-09: 600 mg via ORAL

## 2021-03-09 MED ORDER — ONDANSETRON HCL 4 MG/2ML IJ SOLN
INTRAMUSCULAR | Status: AC
  Administered 2021-03-09: 4 mg via INTRAVENOUS
  Filled 2021-03-09: qty 2

## 2021-03-09 MED ORDER — INSULIN ASPART 100 UNIT/ML IJ SOLN
0.0000 [IU] | Freq: Three times a day (TID) | INTRAMUSCULAR | Status: DC
  Administered 2021-03-10 – 2021-03-12 (×2): 3 [IU] via SUBCUTANEOUS
  Filled 2021-03-09 (×2): qty 1

## 2021-03-09 MED ORDER — CLOPIDOGREL BISULFATE 75 MG PO TABS
ORAL_TABLET | ORAL | Status: AC
  Filled 2021-03-09: qty 8

## 2021-03-09 MED ORDER — HEPARIN SODIUM (PORCINE) 1000 UNIT/ML IJ SOLN
INTRAMUSCULAR | Status: DC | PRN
  Administered 2021-03-09 (×2): 5000 [IU] via INTRAVENOUS

## 2021-03-09 MED ORDER — MIDAZOLAM HCL 2 MG/2ML IJ SOLN
INTRAMUSCULAR | Status: AC
  Filled 2021-03-09: qty 2

## 2021-03-09 MED ORDER — ISOSORBIDE MONONITRATE ER 30 MG PO TB24
30.0000 mg | ORAL_TABLET | Freq: Every day | ORAL | Status: DC
  Administered 2021-03-09 – 2021-03-10 (×2): 30 mg via ORAL
  Filled 2021-03-09 (×2): qty 1

## 2021-03-09 MED ORDER — SODIUM CHLORIDE 0.9 % IV SOLN
INTRAVENOUS | Status: AC | PRN
  Administered 2021-03-09: 250 mL via INTRAVENOUS

## 2021-03-09 SURGICAL SUPPLY — 19 items
BALLN TREK RX 2.25X12 (BALLOONS) ×2
BALLN ~~LOC~~ EUPHORA RX 2.75X15 (BALLOONS) ×2
BALLOON TREK RX 2.25X12 (BALLOONS) ×1 IMPLANT
BALLOON ~~LOC~~ EUPHORA RX 2.75X15 (BALLOONS) ×1 IMPLANT
CATH INFINITI JR4 5F (CATHETERS) ×2 IMPLANT
CATH LAUNCHER 6FR EBU3.5 (CATHETERS) ×2 IMPLANT
DEVICE RAD TR BAND REGULAR (VASCULAR PRODUCTS) ×2 IMPLANT
DRAPE BRACHIAL (DRAPES) ×2 IMPLANT
GLIDESHEATH SLEND SS 6F .021 (SHEATH) ×2 IMPLANT
KIT ENCORE 26 ADVANTAGE (KITS) ×2 IMPLANT
PACK CARDIAC CATH (CUSTOM PROCEDURE TRAY) ×2 IMPLANT
PROTECTION STATION PRESSURIZED (MISCELLANEOUS) ×2
SET ATX SIMPLICITY (MISCELLANEOUS) ×2 IMPLANT
STATION PROTECTION PRESSURIZED (MISCELLANEOUS) ×1 IMPLANT
STENT ONYX FRONTIER 2.5X22 (Permanent Stent) ×2 IMPLANT
STENT ONYX FRONTIER 2.75X12 (Permanent Stent) ×2 IMPLANT
WIRE ASAHI PROWATER 180CM (WIRE) ×2 IMPLANT
WIRE HI TORQ VERSACORE J 260CM (WIRE) ×2 IMPLANT
WIRE RUNTHROUGH .014X180CM (WIRE) ×2 IMPLANT

## 2021-03-09 NOTE — Progress Notes (Signed)
Susan Mclean    MR#:  [id number]  DATE OF BIRTH:  Jul 02, 1950  SUBJECTIVE:   came in with chest pain. Patient was started on IV Nitro drip. Continue to have arm pain and back pain. Underwent emergent cardiac cath early hours of the morning with two stent placement REVIEW OF SYSTEMS:   Review of Systems  Constitutional:  Negative for chills, fever and weight loss.  HENT:  Negative for ear discharge, ear pain and nosebleeds.   Eyes:  Negative for blurred vision, pain and discharge.  Respiratory:  Negative for sputum production, shortness of breath, wheezing and stridor.   Cardiovascular:  Negative for chest pain, palpitations, orthopnea and PND.  Gastrointestinal:  Negative for abdominal pain, diarrhea, nausea and vomiting.  Genitourinary:  Negative for frequency and urgency.  Musculoskeletal:  Negative for back pain and joint pain.  Neurological:  Negative for sensory change, speech change, focal weakness and weakness.  Psychiatric/Behavioral:  Negative for depression and hallucinations. The patient is not nervous/anxious.   Tolerating Diet: Tolerating PT:   DRUG ALLERGIES:  No Known Allergies  VITALS:  Blood pressure 113/76, pulse 75, temperature 97.7 F (36.5 C), temperature source Oral, resp. rate 13, height 5\' 1"  (1.549 m), weight 87.7 kg, SpO2 97 %.  PHYSICAL EXAMINATION:   Physical Exam  GENERAL:  70 y.o.-year-old patient lying in the bed with no acute distress.  HEENT: Head atraumatic, normocephalic. Oropharynx and nasopharynx clear.  NECK:  Supple, no jugular venous distention. No thyroid enlargement, no tenderness.  LUNGS: Normal breath sounds bilaterally, no wheezing, rales, rhonchi. No use of accessory muscles of respiration.  CARDIOVASCULAR: S1, S2 normal. No murmurs, rubs, or gallops.  ABDOMEN: Soft, nontender, nondistended. Bowel sounds present. No organomegaly or mass.  EXTREMITIES: No  cyanosis, clubbing or edema b/l.    NEUROLOGIC: Cranial nerves II through XII are intact. No focal Motor or sensory deficits b/l.   PSYCHIATRIC:  patient is alert and oriented x 3.  SKIN: No obvious rash, lesion, or ulcer.   LABORATORY PANEL:  CBC Recent Labs  Lab 03/09/21 0629  WBC 5.9  HGB 11.8*  HCT 33.9*  PLT 169    Chemistries  Recent Labs  Lab 03/08/21 2057 03/09/21 0629  NA  --  136  K  --  4.7  CL  --  109  CO2  --  19*  GLUCOSE  --  126*  BUN  --  33*  CREATININE  --  2.35*  CALCIUM  --  9.0  MG 1.8  --   AST 29  --   ALT 11  --   ALKPHOS 139*  --   BILITOT 0.9  --    Cardiac Enzymes No results for input(s): TROPONINI in the last 168 hours. RADIOLOGY:  DG Chest 2 View  Result Date: 03/08/2021 CLINICAL DATA:  Chest pain.  Emesis. EXAM: CHEST - 2 VIEW COMPARISON:  Chest x-ray 03/03/2011. FINDINGS: The heart and mediastinal contours are within normal limits. Coronary artery stent. Aortic calcification. No focal consolidation. No pulmonary edema. No pleural effusion. No pneumothorax. No acute osseous abnormality. IMPRESSION: No active cardiopulmonary disease. Electronically Signed   By: Iven Finn M.D.   On: 03/08/2021 18:21   CT HEAD WO CONTRAST (5MM)  Result Date: 03/08/2021 CLINICAL DATA:  Headache, intracranial hemorrhage suspected EXAM: CT HEAD WITHOUT CONTRAST TECHNIQUE: Contiguous axial images were obtained from the base of the skull through the vertex without  intravenous contrast. COMPARISON:  None. FINDINGS: Brain: No acute hemorrhage. No subdural collection. Age related atrophy. Moderate area of encephalomalacia involving the right posterior parietooccipital lobe. Small area of encephalomalacia involving the left parietooccipital lobe. Remote lacunar infarcts in the basal ganglia and right thalamus. No evidence of acute ischemia. Mild background chronic small vessel ischemia. No hydrocephalus, midline shift, mass lesion/mass effect. Vascular: Dense  atherosclerosis at the skull base. No hyperdense vessel. Skull: No fracture or focal lesion. Sinuses/Orbits: Opacification of right maxillary sinus is only partially included. There is right frontal sinus opacification with cortical thickening, chronic. Scattered mucosal thickening of ethmoid air cells. Minimal frothy debris in left side of sphenoid sinus. No mastoid effusion. Other: None. IMPRESSION: 1. No acute intracranial abnormality. 2. Age related atrophy and chronic small vessel ischemia. Remote infarcts in the bilateral parietooccipital lobes and basal ganglia. 3. Chronic right frontal and maxillary sinusitis. Electronically Signed   By: Keith Rake M.D.   On: 03/08/2021 21:34   CARDIAC CATHETERIZATION  Result Date: 03/09/2021 Conclusions: Multivessel coronary artery disease, as detailed below, including 60% mid LAD stenosis just distal to previously placed stent, multifocal codominant LCx disease of up to 80% in the distal vessel as well as thrombotic occlusion of large OM3 branch (culprit for patient's NSTEMI), and 60% proximal/mid RCA disease as well as 90% stenosis involving acute marginal branch. Widely patent mid LAD stent. Normal left ventricular filling pressure. Successful PCI to OM 3 using Onyx Frontier 2.5 x 22 mm drug-eluting stent with 0% residual stenosis and TIMI-3 flow. Successful PCI to distal LCx using Onyx Frontier 2.75 x 12 mm drug-eluting stent with 0% residual stenosis and TIMI-3 flow. Recommendations: Admit to ICU for post STEMI care. Start IV heparin 2 hours after TR band removal given history of paroxysmal atrial fibrillation.  Anticipate transitioning to apixaban as soon as tomorrow if no evidence of bleeding/vascular complications. Anticipate triple therapy with aspirin, clopidogrel, and apixaban x1 week, after which time aspirin can be can discontinued and clopidogrel and apixaban maintained for 12 months. Obtain echocardiogram. Gentle post catheterization hydration with  careful monitoring of renal function given patient's CKD and high risk for contrast-induced nephropathy. Aggressive secondary prevention. Nelva Bush, MD [facility]  CT Angio Chest Aorta W and/or Wo Contrast  Result Date: 03/08/2021 CLINICAL DATA:  Left chest pain EXAM: CT ANGIOGRAPHY CHEST WITH CONTRAST TECHNIQUE: Multidetector CT imaging of the chest was performed using the standard protocol during bolus administration of intravenous contrast. Multiplanar CT image reconstructions and MIPs were obtained to evaluate the vascular anatomy. CONTRAST:  11mL OMNIPAQUE IOHEXOL 350 MG/ML SOLN COMPARISON:  None. FINDINGS: Cardiovascular: On unenhanced CT, there is no evidence of intramural hematoma. Following contrast administration, there is preferential opacification of the thoracic aorta. No evidence of thoracic aortic aneurysm or dissection. Although not tailored for evaluation of the pulmonary arteries, there is no evidence of pulmonary embolism to the lobar level. The heart is normal in size.  No pericardial effusion. Mediastinum/Nodes: No suspicious mediastinal lymphadenopathy. Visualized thyroid is unremarkable. Lungs/Pleura: Linear scarring/atelectasis in the lingula and bilateral lower lobes. No focal consolidation No suspicious pulmonary nodules. No pleural effusion or pneumothorax. Upper Abdomen: Visualized upper abdomen is notable for prior cholecystectomy. Mildly prominent common duct, likely postsurgical. Suspected left renal cysts (series 5/image 89), incompletely visualized. Musculoskeletal: Degenerative changes of the visualized thoracolumbar spine. Review of the MIP images confirms the above findings. IMPRESSION: No evidence of thoracic aortic aneurysm or dissection. No evidence of pulmonary embolism. No evidence of acute cardiopulmonary  disease. Electronically Signed   By: Julian Hy M.D.   On: 03/08/2021 23:42   ASSESSMENT AND PLAN:   Delayne Sanzo is a 70 y.o. female with medical  history significant for  CVA, CAD with history of previous MI in [place], A. fib on Eliquis, CHF, HTN and DM presenting with severe substernal and left-sided chest pain like a heaviness that started around 1400 Pain was severe, constant, and radiating to the back and left arm . She stated since the day prior she was having nausea and vomiting , up until the onset of the pain.     NSTEMI s/p PCI 03/09/21   CAD with hx MI 2021 w/ stent LAD - Patient with history of CAD and prior MI with stent presenting with NSTEMI, taken emergently to Cath Lab due to intractable pain unrelieved with IV nitroglycerin - s/p DES to OM 3 and LCx and is chest pain-free - [facility] cardiology consult to follow -- recommends aspirin for one week, Plavix 75 mg daily for at least 12 months and resume eliquis at discharge     DM type 2 (diabetes mellitus, type 2)  - Sliding scale insulin coverage --sugars stable -check a1c    Atrial fibrillation (HCC) Chronic anticoagulation - patient currently on IV heparin drip. Will resume eliquis at discharge   obesity, Class III, BMI 40-49.9 (morbid obesity) ([place]) - Complicating factor to overall prognosis and care     HTN (hypertension) - continue carvedilol and imdur    Procedures: cardiac cath Family communication : son Landry Mellow on the phone Consults : [facility] cardiology CODE STATUS: full DVT Prophylaxis : heparin drip Level of care: Stepdown Status is: Inpatient  Remains inpatient appropriate because: non-STEMI status post stent times two. Cardiology recommends monitor hemoglobin in-house        TOTAL TIME TAKING CARE OF THIS PATIENT: 25 minutes.  >50% time spent on counselling and coordination of care  Note: This dictation was prepared with Dragon dictation along with smaller phrase technology. Any transcriptional errors that result from this process are unintentional.  Fritzi Mandes M.D    [facility]   CC: Primary care physician;  Bo Merino, FNP Patient ID: Susan Mclean, female   DOB: [birth year], 70 y.o.   MRN: [id number]

## 2021-03-09 NOTE — H&P (View-Only) (Signed)
Cardiology Consultation:   Patient ID: Susan Mclean MRN: [id number]; DOB: 26-Nov-1950  Admit date: 03/08/2021 Date of Consult: 03/09/2021  PCP:  Bo Merino, FNP   [facility] Providers Cardiologist:  New - Abrina Petz     Patient Profile:   Susan Mclean is a 70 y.o. female with a hx of MI (managed medically in the setting of prolonged hospitalization with intubation in [place] 2021), chronic heart failure (details unknown), paroxysmal atrial fibrillation, stroke, hypertension, hyperlipidemia, diabetes mellitus, and chronic kidney disease, who is being seen 03/09/2021 for the evaluation of chest pain and elevated troponin at the request of Dr. Joni Fears.  History of Present Illness:   Susan Mclean reports that yesterday morning shortly after 8:00 AM, she suddenly became nauseated and threw up after eating breakfast.  She subsequently developed worsening tightness across her chest, feeling as though her chest were in a vice.  It began radiating to the shoulders and the left arm as well as her upper back.  She wound up taking her Imdur in the early afternoon again and noticed transient relief.  However, she subsequently had onset of severe pain that incapacitated her.  She reached out to her son who called 84.  She was transported to the emergency department where she has continued to have significant chest pain despite initiation of IV nitroglycerin.  She reports associated shortness of breath and diaphoresis.  Over the last day or two, she has also noticed some swelling in the left calf.  Patient is on apixaban for treatment of her atrial fibrillation, having taken her last dose yesterday morning (she believes she threw this up).  In the emergency department, patient was noted to have rising troponin as well as inferolateral T wave inversions.  Due to marked hypertension with accompanying chest and upper back pain as well as headache, she was referred for head CT and CTA of the chest to exclude  dissection.  No acute abnormality was identified.   Past medical history: Coronary artery disease with prior MI (details unavailable) CHF (details unavailable) Paroxysmal atrial fibrillation Stroke Hypertension Hyperlipidemia Type 2 diabetes mellitus Chronic kidney disease  Past surgical history: None available Inpatient Medications: Scheduled Meds:  [START ON 03/10/2021] aspirin  81 mg Oral Pre-Cath   [MAR Hold] sodium chloride flush  3 mL Intravenous Q12H   Continuous Infusions:  sodium chloride     sodium chloride     [MAR Hold] nitroGLYCERIN 75 mcg/min (03/09/21 0223)   PRN Meds: sodium chloride, [MAR Hold] nitroGLYCERIN, sodium chloride flush  Allergies:   Not on File  Social History:   Unable to obtain due to acuity of illness.   Family History:   Unable to obtain due to acuity of illness.  ROS:  Please see the history of present illness. All other ROS reviewed and negative.     Physical Exam/Data:   Vitals:   03/09/21 0130 03/09/21 0200 03/09/21 0220 03/09/21 0230  BP: (!) 174/106 (!) 177/101 (!) 180/105 (!) 177/101  Pulse: 72 65 70 63  Resp: 17 16 20 18   Temp:      TempSrc:      SpO2: 97% 97% 96% 99%  Weight:      Height:        Intake/Output Summary (Last 24 hours) at 03/09/2021 0312 Last data filed at 03/09/2021 0106 Gross per 24 hour  Intake 500 ml  Output --  Net 500 ml   Last 3 Weights 03/08/2021  Weight (lbs) 232 lb  Weight (kg)  105.235 kg     Body mass index is 43.84 kg/m.  General: Uncomfortable appearing woman lying on stretcher in emergency department. HEENT: normal Neck: no JVD Vascular: No carotid bruits; 2+ radial pulses bilaterally Cardiac: Regular rate and rhythm without murmurs, rubs, or gallops. Lungs:  clear to auscultation bilaterally, no wheezing, rhonchi or rales  Abd: soft, nontender, no hepatomegaly  Ext: Trace left ankle edema. Musculoskeletal:  No deformities, BUE and BLE strength normal and equal Skin: warm and  dry  Neuro:  CNs 2-12 intact, no focal abnormalities noted Psych:  Normal affect   EKG:  The EKG was personally reviewed and demonstrates: Normal sinus rhythm with inferolateral T wave inversions and nonspecific ST changes.  Telemetry:  Telemetry was personally reviewed and demonstrates: Normal sinus rhythm  Relevant CV Studies: None available  Laboratory Data:  High Sensitivity Troponin:   Recent Labs  Lab 03/08/21 1749 03/08/21 2057  TROPONINIHS 171* 1,735*     Chemistry Recent Labs  Lab 03/08/21 1749 03/08/21 2057  NA 136  --   K 4.8  --   CL 109  --   CO2 16*  --   GLUCOSE 154*  --   BUN 36*  --   CREATININE 2.59*  --   CALCIUM 9.4  --   MG  --  1.8  GFRNONAA 19*  --   ANIONGAP 11  --     Recent Labs  Lab 03/08/21 2057  PROT 9.0*  ALBUMIN 3.3*  AST 29  ALT 11  ALKPHOS 139*  BILITOT 0.9   Lipids No results for input(s): CHOL, TRIG, HDL, LABVLDL, LDLCALC, CHOLHDL in the last 168 hours.  Hematology Recent Labs  Lab 03/08/21 1749  WBC 4.7  RBC 3.97  HGB 11.7*  HCT 34.2*  MCV 86.1  MCH 29.5  MCHC 34.2  RDW 14.9  PLT 166   Thyroid No results for input(s): TSH, FREET4 in the last 168 hours.  BNP Recent Labs  Lab 03/08/21 1749  BNP 215.2*    DDimer No results for input(s): DDIMER in the last 168 hours.   Radiology/Studies:  DG Chest 2 View  Result Date: 03/08/2021 CLINICAL DATA:  Chest pain.  Emesis. EXAM: CHEST - 2 VIEW COMPARISON:  Chest x-ray 03/03/2011. FINDINGS: The heart and mediastinal contours are within normal limits. Coronary artery stent. Aortic calcification. No focal consolidation. No pulmonary edema. No pleural effusion. No pneumothorax. No acute osseous abnormality. IMPRESSION: No active cardiopulmonary disease. Electronically Signed   By: Iven Finn M.D.   On: 03/08/2021 18:21   CT HEAD WO CONTRAST (5MM)  Result Date: 03/08/2021 CLINICAL DATA:  Headache, intracranial hemorrhage suspected EXAM: CT HEAD WITHOUT CONTRAST  TECHNIQUE: Contiguous axial images were obtained from the base of the skull through the vertex without intravenous contrast. COMPARISON:  None. FINDINGS: Brain: No acute hemorrhage. No subdural collection. Age related atrophy. Moderate area of encephalomalacia involving the right posterior parietooccipital lobe. Small area of encephalomalacia involving the left parietooccipital lobe. Remote lacunar infarcts in the basal ganglia and right thalamus. No evidence of acute ischemia. Mild background chronic small vessel ischemia. No hydrocephalus, midline shift, mass lesion/mass effect. Vascular: Dense atherosclerosis at the skull base. No hyperdense vessel. Skull: No fracture or focal lesion. Sinuses/Orbits: Opacification of right maxillary sinus is only partially included. There is right frontal sinus opacification with cortical thickening, chronic. Scattered mucosal thickening of ethmoid air cells. Minimal frothy debris in left side of sphenoid sinus. No mastoid effusion. Other: None. IMPRESSION: 1. No  acute intracranial abnormality. 2. Age related atrophy and chronic small vessel ischemia. Remote infarcts in the bilateral parietooccipital lobes and basal ganglia. 3. Chronic right frontal and maxillary sinusitis. Electronically Signed   By: Keith Rake M.D.   On: 03/08/2021 21:34   CT Angio Chest Aorta W and/or Wo Contrast  Result Date: 03/08/2021 CLINICAL DATA:  Left chest pain EXAM: CT ANGIOGRAPHY CHEST WITH CONTRAST TECHNIQUE: Multidetector CT imaging of the chest was performed using the standard protocol during bolus administration of intravenous contrast. Multiplanar CT image reconstructions and MIPs were obtained to evaluate the vascular anatomy. CONTRAST:  38mL OMNIPAQUE IOHEXOL 350 MG/ML SOLN COMPARISON:  None. FINDINGS: Cardiovascular: On unenhanced CT, there is no evidence of intramural hematoma. Following contrast administration, there is preferential opacification of the thoracic aorta. No evidence  of thoracic aortic aneurysm or dissection. Although not tailored for evaluation of the pulmonary arteries, there is no evidence of pulmonary embolism to the lobar level. The heart is normal in size.  No pericardial effusion. Mediastinum/Nodes: No suspicious mediastinal lymphadenopathy. Visualized thyroid is unremarkable. Lungs/Pleura: Linear scarring/atelectasis in the lingula and bilateral lower lobes. No focal consolidation No suspicious pulmonary nodules. No pleural effusion or pneumothorax. Upper Abdomen: Visualized upper abdomen is notable for prior cholecystectomy. Mildly prominent common duct, likely postsurgical. Suspected left renal cysts (series 5/image 89), incompletely visualized. Musculoskeletal: Degenerative changes of the visualized thoracolumbar spine. Review of the MIP images confirms the above findings. IMPRESSION: No evidence of thoracic aortic aneurysm or dissection. No evidence of pulmonary embolism. No evidence of acute cardiopulmonary disease. Electronically Signed   By: Julian Hy M.D.   On: 03/08/2021 23:42     Assessment and Plan:   High risk NSTEMI: Patient presents with chest pain that began yesterday morning around 8 AM and has waxed and waned in intensity but not resolved even with aggressive medical therapy including IV nitroglycerin.  EKG shows nonspecific ST changes and inferolateral T wave inversions concerning for ischemia.  Troponin has also risen significantly.  CTA of the chest did not demonstrate evidence of aortic dissection or PE. -Continued medical therapy versus urgent cardiac catheterization discussed with the patient.  She wishes to pursue catheterization and is aware of risks of the procedure including elevated risk for contrast-induced nephropathy given her chronic kidney disease and contrast that she received with CTA earlier today.  I offered to speak with her son, who is currently at home.  Susan Mclean asked that I not call him at this time. -Continue  IV nitroglycerin. -Defer adding heparin at this time given that patient is on apixaban and being transported to catheterization lab now. -Aggressive secondary prevention, including high intensity statin therapy.  CHF: No specifics known.  Other than trace left ankle edema, the patient appears euvolemic.  She has NYHA class II symptoms but is relatively immobile, using a walker to get around. -We will check LVEDP at time of catheterization. -Obtain echocardiogram.  Paroxysmal atrial fibrillation: Currently in sinus rhythm, having been on apixaban and amiodarone.  CHA2DS2-VASc score at least 8 -Continue amiodarone 200 mg daily. -Hold apixaban pending catheterization.  Further anticoagulation recommendations to be made after procedure.  Chronic kidney disease: Baseline creatinine unknown.  Creatinine significantly elevated 2.5 today.  Patient is also received IV contrast for CTA today. -Judicious use of contrast during catheterization.  Patient is aware that she is at elevated risk for contrast-induced nephropathy including potential need for hemodialysis (she reports having been on temporary hemodialysis during her extended hospitalization in [place]  last year).  Hypertension: Blood pressure severely elevated on admission and still labile. -Continue nitroglycerin infusion for now. -Further recommendations to be made after catheterization.  Hyperlipidemia associated with type 2 diabetes mellitus: -High intensity statin therapy. -Further management per internal medicine.  Shared Decision Making/Informed Consent The risks [stroke (1 in 1000), death (1 in 1000), kidney failure [usually temporary] (1 in 500), bleeding (1 in 200), allergic reaction [possibly serious] (1 in 200)], benefits (diagnostic support and management of coronary artery disease) and alternatives of a cardiac catheterization were discussed in detail with Susan Mclean and she is willing to proceed.  For questions or updates,  please contact [place] Please consult [email or web address] for contact info under [facility].  Signed, Nelva Bush, MD  03/09/2021 3:12 AM

## 2021-03-09 NOTE — Progress Notes (Signed)
*  PRELIMINARY RESULTS* Echocardiogram 2D Echocardiogram has been performed.  Susan Mclean Susan Mclean 03/09/2021, 11:43 AM

## 2021-03-09 NOTE — H&P (Signed)
History and Physical    Susan Mclean DXI:[id number] DOB: June 19, 1950 DOA: 03/08/2021  PCP: Bo Merino, FNP   Patient coming from: home  I have personally briefly reviewed patient's relevant medical records in [place]  Chief Complaint: NSTEMI s/p cath  HPI: Susan Mclean is a 70 y.o. female with medical history significant for  CVA, CAD with history of previous MI in [place], A. fib on Eliquis, CHF, HTN and DM presenting with severe substernal and left-sided chest pain like a heaviness that started around 1400 .  Pain was severe, constant, and radiating to the back and left arm . She stated since the day prior she was having nausea and vomiting , up until the onset of the pain.  She denies cough, shortness of breath, fever or chills.    On arrival she was found to have a BP of 218/102 and EKG was consistent with NSTEMI Troponin was 171>1735 EKG did not meet STEMI criteria but patient was having intractable pain unrelieved with IV nitroglycerin.  Patient was evaluated by cardiologist Dr. Saunders Revel who took patient to the Cath Lab for emergent PCI.  She was found to have multivessel CAD including 60% mid LAD distal to the previously placed stent.  She had PCI with DES to OM 3 with 0 residual stenosis into the distal LCx with 0 residual stenosis  Hospitalist consulted post-cath for admission for post STEMI care    Review of Systems: As per HPI otherwise all other systems on review of systems negative.    Past Medical History:  Diagnosis Date   Atrial fibrillation (HCC)    CHF (congestive heart failure) (HCC)    HTN (hypertension)    Hypertension    MI (myocardial infarction) ([place])     Past Surgical History:  Procedure Laterality Date   CORONARY STENT PLACEMENT       reports that she does not currently use alcohol. She reports that she does not currently use drugs. No history on file for tobacco use.  No Known Allergies  History reviewed. No pertinent family  history.    Prior to Admission medications   Not on File    Physical Exam: Vitals:   03/09/21 0130 03/09/21 0200 03/09/21 0220 03/09/21 0230  BP: (!) 174/106 (!) 177/101 (!) 180/105 (!) 177/101  Pulse: 72 65 70 63  Resp: 17 16 20 18   Temp:      TempSrc:      SpO2: 97% 97% 96% 99%  Weight:      Height:       Constitutional: Alert and oriented x 3 . Not in any apparent distress HEENT:      Head: Normocephalic and atraumatic.         Eyes: PERLA, EOMI, Conjunctivae are normal. Sclera is non-icteric.       Mouth/Throat: Mucous membranes are moist.       Neck: Supple with no signs of meningismus. Cardiovascular: Regular rate and rhythm. No murmurs, gallops, or rubs. 2+ symmetrical distal pulses are present . No JVD. No  LE edema Respiratory: Respiratory effort normal .Lungs sounds clear bilaterally. No wheezes, crackles, or rhonchi.  Gastrointestinal: Soft, non tender, non distended. Positive bowel sounds.  Genitourinary: No CVA tenderness. Musculoskeletal: Nontender with normal range of motion in all extremities. No cyanosis, or erythema of extremities. Neurologic:  Face is symmetric. Moving all extremities. No gross focal neurologic deficits . Skin: Skin is warm, dry.  No rash or ulcers Psychiatric: Mood and affect are  appropriate    Labs on Admission: I have personally reviewed following labs and imaging studies  CBC: Recent Labs  Lab 03/08/21 1749  WBC 4.7  HGB 11.7*  HCT 34.2*  MCV 86.1  PLT 937   Basic Metabolic Panel: Recent Labs  Lab 03/08/21 1749 03/08/21 2057  NA 136  --   K 4.8  --   CL 109  --   CO2 16*  --   GLUCOSE 154*  --   BUN 36*  --   CREATININE 2.59*  --   CALCIUM 9.4  --   MG  --  1.8   GFR: Estimated Creatinine Clearance: 22.6 mL/min (A) (by C-G formula based on SCr of 2.59 mg/dL (H)). Liver Function Tests: Recent Labs  Lab 03/08/21 2057  AST 29  ALT 11  ALKPHOS 139*  BILITOT 0.9  PROT 9.0*  ALBUMIN 3.3*   No results for  input(s): LIPASE, AMYLASE in the last 168 hours. No results for input(s): AMMONIA in the last 168 hours. Coagulation Profile: No results for input(s): INR, PROTIME in the last 168 hours. Cardiac Enzymes: No results for input(s): CKTOTAL, CKMB, CKMBINDEX, TROPONINI in the last 168 hours. BNP (last 3 results) No results for input(s): PROBNP in the last 8760 hours. HbA1C: No results for input(s): HGBA1C in the last 72 hours. CBG: No results for input(s): GLUCAP in the last 168 hours. Lipid Profile: No results for input(s): CHOL, HDL, LDLCALC, TRIG, CHOLHDL, LDLDIRECT in the last 72 hours. Thyroid Function Tests: No results for input(s): TSH, T4TOTAL, FREET4, T3FREE, THYROIDAB in the last 72 hours. Anemia Panel: No results for input(s): VITAMINB12, FOLATE, FERRITIN, TIBC, IRON, RETICCTPCT in the last 72 hours. Urine analysis: No results found for: COLORURINE, APPEARANCEUR, [place], [place], GLUCOSEU, [place], BILIRUBINUR, KETONESUR, PROTEINUR, UROBILINOGEN, NITRITE, LEUKOCYTESUR  Radiological Exams on Admission: DG Chest 2 View  Result Date: 03/08/2021 CLINICAL DATA:  Chest pain.  Emesis. EXAM: CHEST - 2 VIEW COMPARISON:  Chest x-ray 03/03/2011. FINDINGS: The heart and mediastinal contours are within normal limits. Coronary artery stent. Aortic calcification. No focal consolidation. No pulmonary edema. No pleural effusion. No pneumothorax. No acute osseous abnormality. IMPRESSION: No active cardiopulmonary disease. Electronically Signed   By: Iven Finn M.D.   On: 03/08/2021 18:21   CT HEAD WO CONTRAST (5MM)  Result Date: 03/08/2021 CLINICAL DATA:  Headache, intracranial hemorrhage suspected EXAM: CT HEAD WITHOUT CONTRAST TECHNIQUE: Contiguous axial images were obtained from the base of the skull through the vertex without intravenous contrast. COMPARISON:  None. FINDINGS: Brain: No acute hemorrhage. No subdural collection. Age related atrophy. Moderate area of encephalomalacia involving  the right posterior parietooccipital lobe. Small area of encephalomalacia involving the left parietooccipital lobe. Remote lacunar infarcts in the basal ganglia and right thalamus. No evidence of acute ischemia. Mild background chronic small vessel ischemia. No hydrocephalus, midline shift, mass lesion/mass effect. Vascular: Dense atherosclerosis at the skull base. No hyperdense vessel. Skull: No fracture or focal lesion. Sinuses/Orbits: Opacification of right maxillary sinus is only partially included. There is right frontal sinus opacification with cortical thickening, chronic. Scattered mucosal thickening of ethmoid air cells. Minimal frothy debris in left side of sphenoid sinus. No mastoid effusion. Other: None. IMPRESSION: 1. No acute intracranial abnormality. 2. Age related atrophy and chronic small vessel ischemia. Remote infarcts in the bilateral parietooccipital lobes and basal ganglia. 3. Chronic right frontal and maxillary sinusitis. Electronically Signed   By: Keith Rake M.D.   On: 03/08/2021 21:34   CARDIAC CATHETERIZATION  Result  Date: 03/09/2021 Conclusions: Multivessel coronary artery disease, as detailed below, including 60% mid LAD stenosis just distal to previously placed stent, multifocal codominant LCx disease of up to 80% in the distal vessel as well as thrombotic occlusion of large OM3 branch (culprit for patient's NSTEMI), and 60% proximal/mid RCA disease as well as 90% stenosis involving acute marginal branch. Widely patent mid LAD stent. Normal left ventricular filling pressure. Successful PCI to OM 3 using Onyx Frontier 2.5 x 22 mm drug-eluting stent with 0% residual stenosis and TIMI-3 flow. Successful PCI to distal LCx using Onyx Frontier 2.75 x 12 mm drug-eluting stent with 0% residual stenosis and TIMI-3 flow. Recommendations: Admit to ICU for post STEMI care. Start IV heparin 2 hours after TR band removal given history of paroxysmal atrial fibrillation.  Anticipate  transitioning to apixaban as soon as tomorrow if no evidence of bleeding/vascular complications. Anticipate triple therapy with aspirin, clopidogrel, and apixaban x1 week, after which time aspirin can be can discontinued and clopidogrel and apixaban maintained for 12 months. Obtain echocardiogram. Gentle post catheterization hydration with careful monitoring of renal function given patient's CKD and high risk for contrast-induced nephropathy. Aggressive secondary prevention. Nelva Bush, MD [facility]  CT Angio Chest Aorta W and/or Wo Contrast  Result Date: 03/08/2021 CLINICAL DATA:  Left chest pain EXAM: CT ANGIOGRAPHY CHEST WITH CONTRAST TECHNIQUE: Multidetector CT imaging of the chest was performed using the standard protocol during bolus administration of intravenous contrast. Multiplanar CT image reconstructions and MIPs were obtained to evaluate the vascular anatomy. CONTRAST:  73mL OMNIPAQUE IOHEXOL 350 MG/ML SOLN COMPARISON:  None. FINDINGS: Cardiovascular: On unenhanced CT, there is no evidence of intramural hematoma. Following contrast administration, there is preferential opacification of the thoracic aorta. No evidence of thoracic aortic aneurysm or dissection. Although not tailored for evaluation of the pulmonary arteries, there is no evidence of pulmonary embolism to the lobar level. The heart is normal in size.  No pericardial effusion. Mediastinum/Nodes: No suspicious mediastinal lymphadenopathy. Visualized thyroid is unremarkable. Lungs/Pleura: Linear scarring/atelectasis in the lingula and bilateral lower lobes. No focal consolidation No suspicious pulmonary nodules. No pleural effusion or pneumothorax. Upper Abdomen: Visualized upper abdomen is notable for prior cholecystectomy. Mildly prominent common duct, likely postsurgical. Suspected left renal cysts (series 5/image 89), incompletely visualized. Musculoskeletal: Degenerative changes of the visualized thoracolumbar spine. Review of  the MIP images confirms the above findings. IMPRESSION: No evidence of thoracic aortic aneurysm or dissection. No evidence of pulmonary embolism. No evidence of acute cardiopulmonary disease. Electronically Signed   By: Julian Hy M.D.   On: 03/08/2021 23:42    Assessment/Plan    NSTEMI s/p PCI 03/09/21   CAD with hx MI 2021 w/ stent LAD - Patient with history of CAD and prior MI with stent presenting with NSTEMI, taken emergently to Cath Lab due to intractable pain unrelieved with IV nitroglycerin - Patient had DES to OM 3 and LCx and is chest pain-free - Further orders per Dr. Saunders Revel - Cardiology consult to follow    DM type 2 (diabetes mellitus, type 2)  - Sliding scale insulin coverage    Atrial fibrillation ([place])    ?History of PE   Chronic anticoagulation - On Lovenox DVT prophylaxis pending verification of meds and then can resume home Eliquis    Obesity, Class III, BMI 40-49.9 (morbid obesity) ([place]) - Complicating factor to overall prognosis and care    HTN (hypertension) - Resume meds pending med rec    DVT prophylaxis: Lovenox  Code  Status: full code  Family Communication:  none  Disposition Plan: Back to previous home environment Consults called: cardiology  Status:At the time of admission, it appears that the appropriate admission status for this patient is INPATIENT. This is judged to be reasonable and necessary in order to provide the required intensity of service to ensure the patient's safety given the presenting symptoms, physical exam findings, and initial radiographic and laboratory data in the context of their  Comorbid conditions.   Patient requires inpatient status due to high intensity of service, high risk for further deterioration and high frequency of surveillance required.   I certify that at the point of admission it is my clinical judgment that the patient will require inpatient hospital care spanning beyond [place] MD [facility]   03/09/2021, 5:03 AM

## 2021-03-09 NOTE — ED Provider Notes (Signed)
Procedures  ----------------------------------------- 12:05 AM on 03/09/2021 ----------------------------------------- Assumed care from Dr. Tamala Julian.    Patient reports 6/10 pain still.  Blood pressure is 133/73.  Denies shortness of breath.  Pain is not radiating currently.  Currently on 10 mcg/min of nitroglycerin infusion.  Discussed with cardiology Dr. Saunders Revel, we plan to continue with titrating nitroglycerin as able, repeat EKG.  If she still having significant pain, may require urgent catheterization.  ----------------------------------------- 2:30 AM on 03/09/2021 -----------------------------------------  Pain still 5/10 despite nitro 50 mcg/min. Bp stable. No evolving ekg change. D/w Dr. Saunders Revel who will plan for urgent LHC.         Carrie Mew, MD 03/09/21 0230

## 2021-03-09 NOTE — Progress Notes (Signed)
Air was removed from the TR band to the right wrist and it began to bleed heavily. 3cc of air was returned to the band. Bleeding is controlled at this time. Will continue to monitor.

## 2021-03-09 NOTE — ED Notes (Addendum)
Dr End (cardio) at bedside with this pt, consent for PCI intervention secured & signed by pt

## 2021-03-09 NOTE — Progress Notes (Signed)
Chaplain responded to High Risk Cath page. Chaplain waited with patient while Hight Risk team was on the way. Chaplain provided ministry of presence, spiritual and emotional support, prayer for calmness, and healing. Chaplain assisted nurse in taking patient to cath lab.

## 2021-03-09 NOTE — Progress Notes (Addendum)
Nitro gtt stopped this AM.   Attempted deflation and removal of TR band first thing this AM w/ subsequent bleeding. Per Cardiology, hold heparin gtt until TR band is fully removed.  TR band finally @ 1700. Heparin gtt will be started @ 1900.  Purewick in place. Pt voiding.   Pt eating well throughout day.

## 2021-03-09 NOTE — Consult Note (Addendum)
Cardiology Consultation:   Patient ID: Susan Mclean MRN: [id number]; DOB: Nov 23, 1950  Admit date: 03/08/2021 Date of Consult: 03/09/2021  PCP:  Bo Merino, FNP   [facility] Providers Cardiologist:  New - Eller Sweis     Patient Profile:   Susan Mclean is a 70 y.o. female with a hx of MI (managed medically in the setting of prolonged hospitalization with intubation in [place] 2021), chronic heart failure (details unknown), paroxysmal atrial fibrillation, stroke, hypertension, hyperlipidemia, diabetes mellitus, and chronic kidney disease, who is being seen 03/09/2021 for the evaluation of chest pain and elevated troponin at the request of Dr. Joni Fears.  History of Present Illness:   Ms. Fulcher reports that yesterday morning shortly after 8:00 AM, she suddenly became nauseated and threw up after eating breakfast.  She subsequently developed worsening tightness across her chest, feeling as though her chest were in a vice.  It began radiating to the shoulders and the left arm as well as her upper back.  She wound up taking her Imdur in the early afternoon again and noticed transient relief.  However, she subsequently had onset of severe pain that incapacitated her.  She reached out to her son who called 71.  She was transported to the emergency department where she has continued to have significant chest pain despite initiation of IV nitroglycerin.  She reports associated shortness of breath and diaphoresis.  Over the last day or two, she has also noticed some swelling in the left calf.  Patient is on apixaban for treatment of her atrial fibrillation, having taken her last dose yesterday morning (she believes she threw this up).  In the emergency department, patient was noted to have rising troponin as well as inferolateral T wave inversions.  Due to marked hypertension with accompanying chest and upper back pain as well as headache, she was referred for head CT and CTA of the chest to exclude  dissection.  No acute abnormality was identified.   Past medical history: Coronary artery disease with prior MI (details unavailable) CHF (details unavailable) Paroxysmal atrial fibrillation Stroke Hypertension Hyperlipidemia Type 2 diabetes mellitus Chronic kidney disease  Past surgical history: None available Inpatient Medications: Scheduled Meds:  [START ON 03/10/2021] aspirin  81 mg Oral Pre-Cath   [MAR Hold] sodium chloride flush  3 mL Intravenous Q12H   Continuous Infusions:  sodium chloride     sodium chloride     [MAR Hold] nitroGLYCERIN 75 mcg/min (03/09/21 0223)   PRN Meds: sodium chloride, [MAR Hold] nitroGLYCERIN, sodium chloride flush  Allergies:   Not on File  Social History:   Unable to obtain due to acuity of illness.   Family History:   Unable to obtain due to acuity of illness.  ROS:  Please see the history of present illness. All other ROS reviewed and negative.     Physical Exam/Data:   Vitals:   03/09/21 0130 03/09/21 0200 03/09/21 0220 03/09/21 0230  BP: (!) 174/106 (!) 177/101 (!) 180/105 (!) 177/101  Pulse: 72 65 70 63  Resp: 17 16 20 18   Temp:      TempSrc:      SpO2: 97% 97% 96% 99%  Weight:      Height:        Intake/Output Summary (Last 24 hours) at 03/09/2021 0312 Last data filed at 03/09/2021 0106 Gross per 24 hour  Intake 500 ml  Output --  Net 500 ml   Last 3 Weights 03/08/2021  Weight (lbs) 232 lb  Weight (kg)  105.235 kg     Body mass index is 43.84 kg/m.  General: Uncomfortable appearing woman lying on stretcher in emergency department. HEENT: normal Neck: no JVD Vascular: No carotid bruits; 2+ radial pulses bilaterally Cardiac: Regular rate and rhythm without murmurs, rubs, or gallops. Lungs:  clear to auscultation bilaterally, no wheezing, rhonchi or rales  Abd: soft, nontender, no hepatomegaly  Ext: Trace left ankle edema. Musculoskeletal:  No deformities, BUE and BLE strength normal and equal Skin: warm and  dry  Neuro:  CNs 2-12 intact, no focal abnormalities noted Psych:  Normal affect   EKG:  The EKG was personally reviewed and demonstrates: Normal sinus rhythm with inferolateral T wave inversions and nonspecific ST changes.  Telemetry:  Telemetry was personally reviewed and demonstrates: Normal sinus rhythm  Relevant CV Studies: None available  Laboratory Data:  High Sensitivity Troponin:   Recent Labs  Lab 03/08/21 1749 03/08/21 2057  TROPONINIHS 171* 1,735*     Chemistry Recent Labs  Lab 03/08/21 1749 03/08/21 2057  NA 136  --   K 4.8  --   CL 109  --   CO2 16*  --   GLUCOSE 154*  --   BUN 36*  --   CREATININE 2.59*  --   CALCIUM 9.4  --   MG  --  1.8  GFRNONAA 19*  --   ANIONGAP 11  --     Recent Labs  Lab 03/08/21 2057  PROT 9.0*  ALBUMIN 3.3*  AST 29  ALT 11  ALKPHOS 139*  BILITOT 0.9   Lipids No results for input(s): CHOL, TRIG, HDL, LABVLDL, LDLCALC, CHOLHDL in the last 168 hours.  Hematology Recent Labs  Lab 03/08/21 1749  WBC 4.7  RBC 3.97  HGB 11.7*  HCT 34.2*  MCV 86.1  MCH 29.5  MCHC 34.2  RDW 14.9  PLT 166   Thyroid No results for input(s): TSH, FREET4 in the last 168 hours.  BNP Recent Labs  Lab 03/08/21 1749  BNP 215.2*    DDimer No results for input(s): DDIMER in the last 168 hours.   Radiology/Studies:  DG Chest 2 View  Result Date: 03/08/2021 CLINICAL DATA:  Chest pain.  Emesis. EXAM: CHEST - 2 VIEW COMPARISON:  Chest x-ray 03/03/2011. FINDINGS: The heart and mediastinal contours are within normal limits. Coronary artery stent. Aortic calcification. No focal consolidation. No pulmonary edema. No pleural effusion. No pneumothorax. No acute osseous abnormality. IMPRESSION: No active cardiopulmonary disease. Electronically Signed   By: Iven Finn M.D.   On: 03/08/2021 18:21   CT HEAD WO CONTRAST (5MM)  Result Date: 03/08/2021 CLINICAL DATA:  Headache, intracranial hemorrhage suspected EXAM: CT HEAD WITHOUT CONTRAST  TECHNIQUE: Contiguous axial images were obtained from the base of the skull through the vertex without intravenous contrast. COMPARISON:  None. FINDINGS: Brain: No acute hemorrhage. No subdural collection. Age related atrophy. Moderate area of encephalomalacia involving the right posterior parietooccipital lobe. Small area of encephalomalacia involving the left parietooccipital lobe. Remote lacunar infarcts in the basal ganglia and right thalamus. No evidence of acute ischemia. Mild background chronic small vessel ischemia. No hydrocephalus, midline shift, mass lesion/mass effect. Vascular: Dense atherosclerosis at the skull base. No hyperdense vessel. Skull: No fracture or focal lesion. Sinuses/Orbits: Opacification of right maxillary sinus is only partially included. There is right frontal sinus opacification with cortical thickening, chronic. Scattered mucosal thickening of ethmoid air cells. Minimal frothy debris in left side of sphenoid sinus. No mastoid effusion. Other: None. IMPRESSION: 1. No  acute intracranial abnormality. 2. Age related atrophy and chronic small vessel ischemia. Remote infarcts in the bilateral parietooccipital lobes and basal ganglia. 3. Chronic right frontal and maxillary sinusitis. Electronically Signed   By: Keith Rake M.D.   On: 03/08/2021 21:34   CT Angio Chest Aorta W and/or Wo Contrast  Result Date: 03/08/2021 CLINICAL DATA:  Left chest pain EXAM: CT ANGIOGRAPHY CHEST WITH CONTRAST TECHNIQUE: Multidetector CT imaging of the chest was performed using the standard protocol during bolus administration of intravenous contrast. Multiplanar CT image reconstructions and MIPs were obtained to evaluate the vascular anatomy. CONTRAST:  50mL OMNIPAQUE IOHEXOL 350 MG/ML SOLN COMPARISON:  None. FINDINGS: Cardiovascular: On unenhanced CT, there is no evidence of intramural hematoma. Following contrast administration, there is preferential opacification of the thoracic aorta. No evidence  of thoracic aortic aneurysm or dissection. Although not tailored for evaluation of the pulmonary arteries, there is no evidence of pulmonary embolism to the lobar level. The heart is normal in size.  No pericardial effusion. Mediastinum/Nodes: No suspicious mediastinal lymphadenopathy. Visualized thyroid is unremarkable. Lungs/Pleura: Linear scarring/atelectasis in the lingula and bilateral lower lobes. No focal consolidation No suspicious pulmonary nodules. No pleural effusion or pneumothorax. Upper Abdomen: Visualized upper abdomen is notable for prior cholecystectomy. Mildly prominent common duct, likely postsurgical. Suspected left renal cysts (series 5/image 89), incompletely visualized. Musculoskeletal: Degenerative changes of the visualized thoracolumbar spine. Review of the MIP images confirms the above findings. IMPRESSION: No evidence of thoracic aortic aneurysm or dissection. No evidence of pulmonary embolism. No evidence of acute cardiopulmonary disease. Electronically Signed   By: Julian Hy M.D.   On: 03/08/2021 23:42     Assessment and Plan:   High risk NSTEMI: Patient presents with chest pain that began yesterday morning around 8 AM and has waxed and waned in intensity but not resolved even with aggressive medical therapy including IV nitroglycerin.  EKG shows nonspecific ST changes and inferolateral T wave inversions concerning for ischemia.  Troponin has also risen significantly.  CTA of the chest did not demonstrate evidence of aortic dissection or PE. -Continued medical therapy versus urgent cardiac catheterization discussed with the patient.  She wishes to pursue catheterization and is aware of risks of the procedure including elevated risk for contrast-induced nephropathy given her chronic kidney disease and contrast that she received with CTA earlier today.  I offered to speak with her son, who is currently at home.  Ms. Olga Millers asked that I not call him at this time. -Continue  IV nitroglycerin. -Defer adding heparin at this time given that patient is on apixaban and being transported to catheterization lab now. -Aggressive secondary prevention, including high intensity statin therapy.  CHF: No specifics known.  Other than trace left ankle edema, the patient appears euvolemic.  She has NYHA class II symptoms but is relatively immobile, using a walker to get around. -We will check LVEDP at time of catheterization. -Obtain echocardiogram.  Paroxysmal atrial fibrillation: Currently in sinus rhythm, having been on apixaban and amiodarone.  CHA2DS2-VASc score at least 8 -Continue amiodarone 200 mg daily. -Hold apixaban pending catheterization.  Further anticoagulation recommendations to be made after procedure.  Chronic kidney disease: Baseline creatinine unknown.  Creatinine significantly elevated 2.5 today.  Patient is also received IV contrast for CTA today. -Judicious use of contrast during catheterization.  Patient is aware that she is at elevated risk for contrast-induced nephropathy including potential need for hemodialysis (she reports having been on temporary hemodialysis during her extended hospitalization in [place]  last year).  Hypertension: Blood pressure severely elevated on admission and still labile. -Continue nitroglycerin infusion for now. -Further recommendations to be made after catheterization.  Hyperlipidemia associated with type 2 diabetes mellitus: -High intensity statin therapy. -Further management per internal medicine.  Shared Decision Making/Informed Consent The risks [stroke (1 in 1000), death (1 in 1000), kidney failure [usually temporary] (1 in 500), bleeding (1 in 200), allergic reaction [possibly serious] (1 in 200)], benefits (diagnostic support and management of coronary artery disease) and alternatives of a cardiac catheterization were discussed in detail with Ms. Lippy and she is willing to proceed.  For questions or updates,  please contact [place] Please consult [email or web address] for contact info under [facility].  Signed, Nelva Bush, MD  03/09/2021 3:12 AM

## 2021-03-09 NOTE — Interval H&P Note (Signed)
History and Physical Interval Note:  03/09/2021 3:25 AM  Susan Mclean  has presented today for surgery, with the diagnosis of NSTEMI.  The various methods of treatment have been discussed with the patient and family. After consideration of risks, benefits and other options for treatment, the patient has consented to  Procedure(s): LEFT HEART CATH AND CORONARY ANGIOGRAPHY (N/A) as a surgical intervention.  The patient's history has been reviewed, patient examined, no change in status, stable for surgery.  I have reviewed the patient's chart and labs.  Questions were answered to the patient's satisfaction.    Cath Lab Visit (complete for each Cath Lab visit)  Clinical Evaluation Leading to the Procedure:   ACS: Yes.    Non-ACS:  N/A  Sammie Schermerhorn

## 2021-03-09 NOTE — Progress Notes (Signed)
ANTICOAGULATION CONSULT NOTE  Pharmacy Consult for heparin infusion Indication: A Fib Per MD: Begin 2 hrs post TR band removal  No Known Allergies  Patient Measurements: Height: 5\' 1"  (154.9 cm) Weight: 105.2 kg (232 lb) IBW/kg (Calculated) : 47.8 Heparin Dosing Weight: 73.4 kg  Vital Signs: Temp: 97.7 F (36.5 C) (11/01 1748) Temp Source: Oral (11/01 1748) BP: 177/101 (11/02 0230) Pulse Rate: 63 (11/02 0230)  Labs: Recent Labs    03/08/21 1749 03/08/21 2057  HGB 11.7*  --   HCT 34.2*  --   PLT 166  --   CREATININE 2.59*  --   TROPONINIHS 171* 1,735*    Estimated Creatinine Clearance: 22.6 mL/min (A) (by C-G formula based on SCr of 2.59 mg/dL (H)).   Medical History: Past Medical History:  Diagnosis Date   Hypertension     Medications:  PTA med:  Eliquis  Assessment: Pt is 70 yo old female w/ hx of HTN and PE (~ 2 yrs ago) on Eliquis, post L heart cath & coronary angiography after presenting to ED w/ CP and diagnosed with NSTEMI.  Goal of Therapy:  Heparin level 0.3-0.7 units/ml Monitor platelets by anticoagulation protocol: Yes   Plan:  No BL labs ordered prior to cath procedure Spoke with RN.  TR band placed ~0430.  Instructed to begin brand removal over ~ 30 to 60 minutes Will not order initial bolus, post cath procedure. Start heparin infusion at 1150 units/hr, 2 hr post TR band removal @ ~0930 to 1000 Will order INR, aPTT, HL 6 hrs after start of infusion If HL confirmed supratherapeutic due to DOAC, will follow aPTT until correlation with HL CBC and HL daily while on heparin.   Renda Rolls, PharmD, MBA 03/09/2021 5:00 AM

## 2021-03-09 NOTE — ED Notes (Signed)
Per ERMD pt will be going to cath lab ,  chaplain personnel at bedside with this pt

## 2021-03-10 ENCOUNTER — Ambulatory Visit: Payer: Self-pay | Admitting: Nurse Practitioner

## 2021-03-10 DIAGNOSIS — I161 Hypertensive emergency: Secondary | ICD-10-CM

## 2021-03-10 DIAGNOSIS — E1159 Type 2 diabetes mellitus with other circulatory complications: Secondary | ICD-10-CM

## 2021-03-10 DIAGNOSIS — I25118 Atherosclerotic heart disease of native coronary artery with other forms of angina pectoris: Secondary | ICD-10-CM

## 2021-03-10 LAB — BASIC METABOLIC PANEL
Anion gap: 9 (ref 5–15)
BUN: 41 mg/dL — ABNORMAL HIGH (ref 8–23)
CO2: 19 mmol/L — ABNORMAL LOW (ref 22–32)
Calcium: 8.5 mg/dL — ABNORMAL LOW (ref 8.9–10.3)
Chloride: 109 mmol/L (ref 98–111)
Creatinine, Ser: 3.23 mg/dL — ABNORMAL HIGH (ref 0.44–1.00)
GFR, Estimated: 15 mL/min — ABNORMAL LOW (ref >60)
Glucose, Bld: 83 mg/dL (ref 70–99)
Potassium: 5 mmol/L (ref 3.5–5.1)
Sodium: 137 mmol/L (ref 135–145)

## 2021-03-10 LAB — CBC
HCT: 29.9 % — ABNORMAL LOW (ref 36.0–46.0)
Hemoglobin: 9.8 g/dL — ABNORMAL LOW (ref 12.0–15.0)
MCH: 28.1 pg (ref 26.0–34.0)
MCHC: 32.8 g/dL (ref 30.0–36.0)
MCV: 85.7 fL (ref 80.0–100.0)
Platelets: 166 10*3/uL (ref 150–400)
RBC: 3.49 MIL/uL — ABNORMAL LOW (ref 3.87–5.11)
RDW: 14.9 % (ref 11.5–15.5)
WBC: 6.5 10*3/uL (ref 4.0–10.5)
nRBC: 0 % (ref 0.0–0.2)

## 2021-03-10 LAB — GLUCOSE, CAPILLARY
Glucose-Capillary: 83 mg/dL (ref 70–99)
Glucose-Capillary: 159 mg/dL — ABNORMAL HIGH (ref 70–99)
Glucose-Capillary: 109 mg/dL — ABNORMAL HIGH (ref 70–99)
Glucose-Capillary: 99 mg/dL (ref 70–99)

## 2021-03-10 LAB — HEPATITIS B SURFACE ANTIGEN: Hepatitis B Surface Ag: NONREACTIVE

## 2021-03-10 LAB — LIPID PANEL
Cholesterol: 105 mg/dL (ref 0–200)
HDL: 36 mg/dL — ABNORMAL LOW (ref >40)
LDL Cholesterol: 60 mg/dL (ref 0–99)
Total CHOL/HDL Ratio: 2.9 RATIO
Triglycerides: 45 mg/dL (ref <150)
VLDL: 9 mg/dL (ref 0–40)

## 2021-03-10 LAB — APTT
aPTT: 157 seconds — ABNORMAL HIGH (ref 24–36)
aPTT: 90 seconds — ABNORMAL HIGH (ref 24–36)

## 2021-03-10 LAB — HEPARIN LEVEL (UNFRACTIONATED)
Heparin Unfractionated: 1.09 IU/mL — ABNORMAL HIGH (ref 0.30–0.70)
Heparin Unfractionated: 0.74 IU/mL — ABNORMAL HIGH (ref 0.30–0.70)

## 2021-03-10 LAB — HIV ANTIBODY (ROUTINE TESTING W REFLEX): HIV Screen 4th Generation wRfx: NONREACTIVE

## 2021-03-10 MED ORDER — SODIUM ZIRCONIUM CYCLOSILICATE 5 G PO PACK
10.0000 g | PACK | Freq: Every day | ORAL | Status: DC
  Administered 2021-03-10 – 2021-03-12 (×3): 10 g via ORAL
  Filled 2021-03-10 (×3): qty 2

## 2021-03-10 MED ORDER — CHLORHEXIDINE GLUCONATE CLOTH 2 % EX PADS: 6.0000 | MEDICATED_PAD | Freq: Every day | CUTANEOUS | Status: DC

## 2021-03-10 MED ORDER — CHLORHEXIDINE GLUCONATE CLOTH 2 % EX PADS
6.0000 | MEDICATED_PAD | Freq: Every day | CUTANEOUS | Status: DC
  Administered 2021-03-11 – 2021-03-12 (×2): 6 via TOPICAL

## 2021-03-10 MED ORDER — HEPARIN (PORCINE) 25000 UT/250ML-% IV SOLN
900.0000 [IU]/h | INTRAVENOUS | Status: DC
  Administered 2021-03-10 – 2021-03-11 (×3): 950 [IU]/h via INTRAVENOUS
  Filled 2021-03-10 (×2): qty 250

## 2021-03-10 NOTE — Progress Notes (Signed)
Pt remains on room air, on heparin gtt.   Bladder scan = 550 ml Pt up and out of bed to bedside commode. Pt voided near 550 ml out.   Pt eating well.   Transfer orders in place, waiting on bed.

## 2021-03-10 NOTE — Progress Notes (Signed)
[facility] NAME: Susan Mclean    MR#:  [id number]  DATE OF BIRTH:  [birth year]  SUBJECTIVE:   patient overall had an uneventful day yesterday. Denies any complaints. Trying to drink water. No chest pain or shortness of breath REVIEW OF SYSTEMS:   Review of Systems  Constitutional:  Negative for chills, fever and weight loss.  HENT:  Negative for ear discharge, ear pain and nosebleeds.   Eyes:  Negative for blurred vision, pain and discharge.  Respiratory:  Negative for sputum production, shortness of breath, wheezing and stridor.   Cardiovascular:  Negative for chest pain, palpitations, orthopnea and PND.  Gastrointestinal:  Negative for abdominal pain, diarrhea, nausea and vomiting.  Genitourinary:  Negative for frequency and urgency.  Musculoskeletal:  Negative for back pain and joint pain.  Neurological:  Negative for sensory change, speech change, focal weakness and weakness.  Psychiatric/Behavioral:  Negative for depression and hallucinations. The patient is not nervous/anxious.   Tolerating Diet:yes Tolerating PT:   DRUG ALLERGIES:  No Known Allergies  VITALS:  Blood pressure (!) 75/61, pulse 75, temperature 98.1 F (36.7 C), temperature source Oral, resp. rate 17, height 5\' 1"  (1.549 m), weight 87.7 kg, SpO2 100 %.  PHYSICAL EXAMINATION:   Physical Exam  GENERAL:  70 y.o.-year-old patient lying in the bed with no acute distress. obese LUNGS: Normal breath sounds bilaterally, no wheezing, rales, rhonchi. No use of accessory muscles of respiration.  CARDIOVASCULAR: S1, S2 normal. No murmurs, rubs, or gallops.  ABDOMEN: Soft, nontender, nondistended. Bowel sounds present. No organomegaly or mass.  EXTREMITIES: No cyanosis, clubbing or edema b/l.    NEUROLOGIC: non focal PSYCHIATRIC:  patient is alert and oriented x 3.  SKIN: No obvious rash, lesion, or ulcer.   LABORATORY PANEL:  CBC Recent Labs  Lab  03/10/21 0458  WBC 6.5  HGB 9.8*  HCT 29.9*  PLT 166     Chemistries  Recent Labs  Lab 03/08/21 2057 03/09/21 0629 03/10/21 0458  NA  --    < > 137  K  --    < > 5.0  CL  --    < > 109  CO2  --    < > 19*  GLUCOSE  --    < > 83  BUN  --    < > 41*  CREATININE  --    < > 3.23*  CALCIUM  --    < > 8.5*  MG 1.8  --   --   AST 29  --   --   ALT 11  --   --   ALKPHOS 139*  --   --   BILITOT 0.9  --   --    < > = values in this interval not displayed.    Cardiac Enzymes No results for input(s): TROPONINI in the last 168 hours. RADIOLOGY:  DG Chest 2 View  Result Date: 03/08/2021 CLINICAL DATA:  Chest pain.  Emesis. EXAM: CHEST - 2 VIEW COMPARISON:  Chest x-ray 03/03/2011. FINDINGS: The heart and mediastinal contours are within normal limits. Coronary artery stent. Aortic calcification. No focal consolidation. No pulmonary edema. No pleural effusion. No pneumothorax. No acute osseous abnormality. IMPRESSION: No active cardiopulmonary disease. Electronically Signed   By: Iven Finn M.D.   On: 03/08/2021 18:21   CT HEAD WO CONTRAST (5MM)  Result Date: 03/08/2021 CLINICAL DATA:  Headache, intracranial hemorrhage suspected EXAM: CT HEAD WITHOUT CONTRAST TECHNIQUE:  Contiguous axial images were obtained from the base of the skull through the vertex without intravenous contrast. COMPARISON:  None. FINDINGS: Brain: No acute hemorrhage. No subdural collection. Age related atrophy. Moderate area of encephalomalacia involving the right posterior parietooccipital lobe. Small area of encephalomalacia involving the left parietooccipital lobe. Remote lacunar infarcts in the basal ganglia and right thalamus. No evidence of acute ischemia. Mild background chronic small vessel ischemia. No hydrocephalus, midline shift, mass lesion/mass effect. Vascular: Dense atherosclerosis at the skull base. No hyperdense vessel. Skull: No fracture or focal lesion. Sinuses/Orbits: Opacification of right  maxillary sinus is only partially included. There is right frontal sinus opacification with cortical thickening, chronic. Scattered mucosal thickening of ethmoid air cells. Minimal frothy debris in left side of sphenoid sinus. No mastoid effusion. Other: None. IMPRESSION: 1. No acute intracranial abnormality. 2. Age related atrophy and chronic small vessel ischemia. Remote infarcts in the bilateral parietooccipital lobes and basal ganglia. 3. Chronic right frontal and maxillary sinusitis. Electronically Signed   By: Keith Rake M.D.   On: 03/08/2021 21:34   CARDIAC CATHETERIZATION  Result Date: 03/09/2021 Conclusions: Multivessel coronary artery disease, as detailed below, including 60% mid LAD stenosis just distal to previously placed stent, multifocal codominant LCx disease of up to 80% in the distal vessel as well as thrombotic occlusion of large OM3 branch (culprit for patient's NSTEMI), and 60% proximal/mid RCA disease as well as 90% stenosis involving acute marginal branch. Widely patent mid LAD stent. Normal left ventricular filling pressure. Successful PCI to OM 3 using Onyx Frontier 2.5 x 22 mm drug-eluting stent with 0% residual stenosis and TIMI-3 flow. Successful PCI to distal LCx using Onyx Frontier 2.75 x 12 mm drug-eluting stent with 0% residual stenosis and TIMI-3 flow. Recommendations: Admit to ICU for post STEMI care. Start IV heparin 2 hours after TR band removal given history of paroxysmal atrial fibrillation.  Anticipate transitioning to apixaban as soon as tomorrow if no evidence of bleeding/vascular complications. Anticipate triple therapy with aspirin, clopidogrel, and apixaban x1 week, after which time aspirin can be can discontinued and clopidogrel and apixaban maintained for 12 months. Obtain echocardiogram. Gentle post catheterization hydration with careful monitoring of renal function given patient's CKD and high risk for contrast-induced nephropathy. Aggressive secondary  prevention. Nelva Bush, MD [facility]  CT Angio Chest Aorta W and/or Wo Contrast  Result Date: 03/08/2021 CLINICAL DATA:  Left chest pain EXAM: CT ANGIOGRAPHY CHEST WITH CONTRAST TECHNIQUE: Multidetector CT imaging of the chest was performed using the standard protocol during bolus administration of intravenous contrast. Multiplanar CT image reconstructions and MIPs were obtained to evaluate the vascular anatomy. CONTRAST:  32mL OMNIPAQUE IOHEXOL 350 MG/ML SOLN COMPARISON:  None. FINDINGS: Cardiovascular: On unenhanced CT, there is no evidence of intramural hematoma. Following contrast administration, there is preferential opacification of the thoracic aorta. No evidence of thoracic aortic aneurysm or dissection. Although not tailored for evaluation of the pulmonary arteries, there is no evidence of pulmonary embolism to the lobar level. The heart is normal in size.  No pericardial effusion. Mediastinum/Nodes: No suspicious mediastinal lymphadenopathy. Visualized thyroid is unremarkable. Lungs/Pleura: Linear scarring/atelectasis in the lingula and bilateral lower lobes. No focal consolidation No suspicious pulmonary nodules. No pleural effusion or pneumothorax. Upper Abdomen: Visualized upper abdomen is notable for prior cholecystectomy. Mildly prominent common duct, likely postsurgical. Suspected left renal cysts (series 5/image 89), incompletely visualized. Musculoskeletal: Degenerative changes of the visualized thoracolumbar spine. Review of the MIP images confirms the above findings. IMPRESSION: No evidence of  thoracic aortic aneurysm or dissection. No evidence of pulmonary embolism. No evidence of acute cardiopulmonary disease. Electronically Signed   By: Julian Hy M.D.   On: 03/08/2021 23:42   ECHOCARDIOGRAM COMPLETE  Result Date: 03/09/2021    ECHOCARDIOGRAM REPORT   Patient Name:   Susan Mclean Date of Exam: 03/09/2021 Medical Rec #:  [id number]    Height:       61.0 in Accession #:     [id number]   Weight:       193.3 lb Date of Birth:  11/08/1950    BSA:          1.862 m Patient Age:    61 years     BP:           112/71 mmHg Patient Gender: F            HR:           65 bpm. Exam Location:  [facility] Procedure: 2D Echo, Color Doppler, Cardiac Doppler and Intracardiac            Opacification Agent Indications:     I21.9 Acute myocardial infarction, unspecified  History:         Patient has no prior history of Echocardiogram examinations.                  CHF; Risk Factors:Hypertension.  Sonographer:     Charmayne Sheer Referring Phys:  [id number] CHRISTOPHER END Diagnosing Phys: Kate Sable MD  Sonographer Comments: Suboptimal apical window and suboptimal subcostal window. Image acquisition challenging due to patient body habitus. IMPRESSIONS  1. Left ventricular ejection fraction, by estimation, is 55%. The left ventricle has normal function. The left ventricle demonstrates regional wall motion abnormalities (see scoring diagram/findings for description). There is mild left ventricular hypertrophy. Left ventricular diastolic parameters are consistent with Grade II diastolic dysfunction (pseudonormalization). There is moderate hypokinesis of the left ventricular, basal-mid inferolateral wall.  2. Right ventricular systolic function is normal. The right ventricular size is normal.  3. The mitral valve is normal in structure. Mild mitral valve regurgitation.  4. The aortic valve was not well visualized. Aortic valve regurgitation is not visualized.  5. The inferior vena cava is normal in size with greater than 50% respiratory variability, suggesting right atrial pressure of 3 mmHg. FINDINGS  Left Ventricle: Left ventricular ejection fraction, by estimation, is 55%. The left ventricle has normal function. The left ventricle demonstrates regional wall motion abnormalities. Moderate hypokinesis of the left ventricular, basal-mid inferolateral wall. Definity contrast agent was given IV to delineate the left  ventricular endocardial borders. The left ventricular internal cavity size was normal in size. There is mild left ventricular hypertrophy. Left ventricular diastolic parameters are consistent with Grade II diastolic dysfunction (pseudonormalization). Right Ventricle: The right ventricular size is normal. No increase in right ventricular wall thickness. Right ventricular systolic function is normal. Left Atrium: Left atrial size was normal in size. Right Atrium: Right atrial size was normal in size. Pericardium: There is no evidence of pericardial effusion. Mitral Valve: The mitral valve is normal in structure. Mild mitral valve regurgitation. MV peak gradient, 1.3 mmHg. The mean mitral valve gradient is 1.0 mmHg. Tricuspid Valve: The tricuspid valve is normal in structure. Tricuspid valve regurgitation is mild. Aortic Valve: The aortic valve was not well visualized. Aortic valve regurgitation is not visualized. Aortic valve mean gradient measures 2.0 mmHg. Aortic valve peak gradient measures 3.8 mmHg. Aortic valve area, by VTI measures 2.48 cm. Pulmonic Valve: The  pulmonic valve was not well visualized. Pulmonic valve regurgitation is not visualized. Aorta: The aortic root is normal in size and structure. Venous: The inferior vena cava is normal in size with greater than 50% respiratory variability, suggesting right atrial pressure of 3 mmHg. IAS/Shunts: No atrial level shunt detected by color flow Doppler.  LEFT VENTRICLE PLAX 2D LVIDd:         3.70 cm   Diastology LVIDs:         2.40 cm   LV e' medial:    3.37 cm/s LV PW:         1.30 cm   LV E/e' medial:  17.8 LV IVS:        1.00 cm   LV e' lateral:   5.00 cm/s LVOT diam:     2.00 cm   LV E/e' lateral: 12.0 LV SV:         47 LV SV Index:   25 LVOT Area:     3.14 cm  LEFT ATRIUM             Index LA diam:        4.20 cm 2.26 cm/m LA Vol (A2C):   33.3 ml 17.89 ml/m LA Vol (A4C):   31.1 ml 16.70 ml/m LA Biplane Vol: 33.6 ml 18.05 ml/m  AORTIC VALVE                     PULMONIC VALVE AV Area (Vmax):    2.45 cm     PV Vmax:       0.82 m/s AV Area (Vmean):   2.38 cm     PV Vmean:      61.000 cm/s AV Area (VTI):     2.48 cm     PV VTI:        0.195 m AV Vmax:           98.10 cm/s   PV Peak grad:  2.7 mmHg AV Vmean:          71.800 cm/s  PV Mean grad:  2.0 mmHg AV VTI:            0.190 m AV Peak Grad:      3.8 mmHg AV Mean Grad:      2.0 mmHg LVOT Vmax:         76.40 cm/s LVOT Vmean:        54.300 cm/s LVOT VTI:          0.150 m LVOT/AV VTI ratio: 0.79  AORTA Ao Root diam: 2.80 cm MITRAL VALVE               TRICUSPID VALVE MV Area (PHT): 3.28 cm    TR Peak grad:   17.5 mmHg MV Area VTI:   2.89 cm    TR Vmax:        209.00 cm/s MV Peak grad:  1.3 mmHg MV Mean grad:  1.0 mmHg    SHUNTS MV Vmax:       0.56 m/s    Systemic VTI:  0.15 m MV Vmean:      35.6 cm/s   Systemic Diam: 2.00 cm MV Decel Time: 231 msec MV E velocity: 60.00 cm/s MV A velocity: 50.10 cm/s MV E/A ratio:  1.20 Kate Sable MD Electronically signed by Kate Sable MD Signature Date/Time: 03/09/2021/6:15:50 PM    Final    ASSESSMENT AND PLAN:   Susan Mclean is a 70 y.o. female with medical history significant  for  CVA, CAD with history of previous MI in [place], A. fib on Eliquis, CHF, HTN and DM presenting with severe substernal and left-sided chest pain like a heaviness that started around 1400 Pain was severe, constant, and radiating to the back and left arm . She stated since the day prior she was having nausea and vomiting , up until the onset of the pain.     NSTEMI s/p PCI 03/09/21   CAD with hx MI 2021 w/ stent LAD - Patient with history of CAD and prior MI with stent presenting with NSTEMI, taken emergently to Cath Lab due to intractable pain unrelieved with IV nitroglycerin--now off - s/p DES to OM 3 and LCx and is chest pain-free - [facility] cardiology consult appreciated --11/3-- cont iv heparin gtt for now per Dr Rockey Situ -- Per Dr End ---recommends aspirin for one week, Plavix  75 mg daily for at least 12 months and resume eliquis at discharge     DM type 2 (diabetes mellitus, type 2)  - Sliding scale insulin coverage --sugars stable -- a1c 6.2%  Acute on CKD-IV suspect contrast induced Baseline creat 2.5 --creat upto 3.25 after cath and CT angio chest --UOP today 550 cc -- nephrology consultation with Dr. Candiss Norse appreciated. Avoid nephrotoxins    Atrial fibrillation (HCC) Chronic anticoagulation at home (on eliquis) - patient currently on IV heparin drip for now while monitoring hgb --hgb 11.8--9.8. no bleeding   obesity, Class III, BMI 40-49.9 (morbid obesity) ([place]) - Complicating factor to overall prognosis and care     HTN (hypertension) - continue carvedilol and imdur   okay to transfer out of ICU per cardiology  Procedures: cardiac cath Family communication : son Landry Mellow on the phone 11/2 Consults : [facility] cardiology CODE STATUS: full DVT Prophylaxis : heparin drip Level of care: Progressive Cardiac Status is: Inpatient  Remains inpatient appropriate because: non-STEMI status post stent times two. Cardiology recommends monitor hemoglobin in-house        TOTAL TIME TAKING CARE OF THIS PATIENT: 25 minutes.  >50% time spent on counselling and coordination of care  Note: This dictation was prepared with Dragon dictation along with smaller phrase technology. Any transcriptional errors that result from this process are unintentional.  Fritzi Mandes M.D    [facility]   CC: Primary care physician; Bo Merino, FNP Patient ID: Susan Mclean, female   DOB: 12/11/1950, 70 y.o.   MRN: [id number]

## 2021-03-10 NOTE — Consult Note (Signed)
[facility] Consult Note:03/10/2021    Date of Admission:  03/08/2021           Reason for Consult: AKI    Referring Provider: Fritzi Mandes, MD Primary Care Provider: Bo Merino, FNP   History of Presenting Illness:  Susan Mclean is a 70 y.o. female with multiple medical problems including stroke, coronary disease with history of MI, atrial fibrillation, CHF, hypertension, diabetes.  This admission she presented with severe sepsis and now chest pain and heaviness.  Patient was diagnosed with non-STEMI and underwent PCI. Creatinine has increased to 3.23 from 2.35 Nephrology consult has been requested for evaluation Patient has known h/o CKD In the past, she has suffered from AKI and has required short-term dialysis while hospitalized.   Review of Systems: ROS  Gen: Denies any fevers or chills HEENT: No vision or hearing problems CV: No chest pain or shortness of breath Resp: No cough or sputum production GI: No nausea, vomiting or diarrhea.  No blood in the stool GU : No problems with voiding.  No hematuria.  + Previous history of kidney problems and dialysis MS: Ambulatory.  Denies any acute joint pain or swelling Derm:   No complaints Psych: No complaints Heme: No complaints Neuro: No complaints Endocrine: No complaints   Past Medical History:  Diagnosis Date   Atrial fibrillation (HCC)    CHF (congestive heart failure) (HCC)    HTN (hypertension)    Hypertension    MI (myocardial infarction) ([place])     Social History   Tobacco Use   Smoking status: Never   Smokeless tobacco: Never  Substance Use Topics   Alcohol use: Not Currently   Drug use: Never    History reviewed. No pertinent family history.   OBJECTIVE: Blood pressure 105/70, pulse 75, temperature 98.1 F (36.7 C), temperature source Oral, resp. rate 13, height 5\' 1"  (1.549 m), weight 87.7 kg, SpO2 100 %.  Physical Exam Physical Exam: General:  No acute distress, laying  in the bed  HEENT  anicteric, moist oral mucous membrane  Pulm/lungs  normal breathing effort, lungs are clear to auscultation  CVS/Heart  irregular rhythm, no rub or gallop  Abdomen:   Soft, nontender  Extremities:  No peripheral edema  Neurologic:  Alert, oriented, able to follow commands  Skin:  No acute rashes     Lab Results Lab Results  Component Value Date   WBC 6.5 03/10/2021   HGB 9.8 (L) 03/10/2021   HCT 29.9 (L) 03/10/2021   MCV 85.7 03/10/2021   PLT 166 03/10/2021    Lab Results  Component Value Date   CREATININE 3.23 (H) 03/10/2021   BUN 41 (H) 03/10/2021   NA 137 03/10/2021   K 5.0 03/10/2021   CL 109 03/10/2021   CO2 19 (L) 03/10/2021    Lab Results  Component Value Date   ALT 11 03/08/2021   AST 29 03/08/2021   ALKPHOS 139 (H) 03/08/2021   BILITOT 0.9 03/08/2021     Microbiology: Recent Results (from the past 240 hour(s))  Resp Panel by RT-PCR (Flu A&B, Covid) Nasopharyngeal Swab     Status: None   Collection Time: 03/08/21  8:57 PM   Specimen: Nasopharyngeal Swab; Nasopharyngeal(NP) swabs in vial transport medium  Result Value Ref Range Status   SARS Coronavirus 2 by RT PCR NEGATIVE NEGATIVE Final    Comment: (NOTE) SARS-CoV-2 target nucleic acids are NOT DETECTED.  The SARS-CoV-2 RNA is generally detectable in upper respiratory specimens  during the acute phase of infection. The lowest concentration of SARS-CoV-2 viral copies this assay can detect is 138 copies/mL. A negative result does not preclude SARS-Cov-2 infection and should not be used as the sole basis for treatment or other patient management decisions. A negative result may occur with  improper specimen collection/handling, submission of specimen other than nasopharyngeal swab, presence of viral mutation(s) within the areas targeted by this assay, and inadequate number of viral copies(<138 copies/mL). A negative result must be combined with clinical observations, patient history,  and epidemiological information. The expected result is Negative.  Fact Sheet for Patients:  EntrepreneurPulse.com.au  Fact Sheet for Healthcare Providers:  IncredibleEmployment.be  This test is no t yet approved or cleared by the [place] FDA and  has been authorized for detection and/or diagnosis of SARS-CoV-2 by FDA under an Emergency Use Authorization (EUA). This EUA will remain  in effect (meaning this test can be used) for the duration of the COVID-19 declaration under Section 564(b)(1) of the Act, 21 U.S.C.section 360bbb-3(b)(1), unless the authorization is terminated  or revoked sooner.       Influenza A by PCR NEGATIVE NEGATIVE Final   Influenza B by PCR NEGATIVE NEGATIVE Final    Comment: (NOTE) The Xpert Xpress SARS-CoV-2/FLU/RSV plus assay is intended as an aid in the diagnosis of influenza from Nasopharyngeal swab specimens and should not be used as a sole basis for treatment. Nasal washings and aspirates are unacceptable for Xpert Xpress SARS-CoV-2/FLU/RSV testing.  Fact Sheet for Patients: EntrepreneurPulse.com.au  Fact Sheet for Healthcare Providers: IncredibleEmployment.be  This test is not yet approved or cleared by the [place] FDA and has been authorized for detection and/or diagnosis of SARS-CoV-2 by FDA under an Emergency Use Authorization (EUA). This EUA will remain in effect (meaning this test can be used) for the duration of the COVID-19 declaration under Section 564(b)(1) of the Act, 21 U.S.C. section 360bbb-3(b)(1), unless the authorization is terminated or revoked.  Performed at [facility], [address] [place], [place] [address]   MRSA Next Gen by PCR, Nasal     Status: None   Collection Time: 03/09/21  5:50 AM   Specimen: Nasal Mucosa; Nasal Swab  Result Value Ref Range Status   MRSA by PCR Next Gen NOT DETECTED NOT DETECTED Final    Comment:  (NOTE) The GeneXpert MRSA Assay (FDA approved for NASAL specimens only), is one component of a comprehensive MRSA colonization surveillance program. It is not intended to diagnose MRSA infection nor to guide or monitor treatment for MRSA infections. Test performance is not FDA approved in patients less than 58 years old. Performed at [facility], [address] [place], [place] [address]     Medications: Scheduled Meds:  aspirin  81 mg Oral Daily   atorvastatin  80 mg Oral Daily   carvedilol  3.125 mg Oral BID WC   clopidogrel  75 mg Oral Q breakfast   insulin aspart  0-15 Units Subcutaneous TID WC   insulin aspart  0-5 Units Subcutaneous QHS   isosorbide mononitrate  30 mg Oral Daily   pantoprazole  20 mg Oral Daily   sodium chloride flush  3 mL Intravenous Q12H   sodium chloride flush  3 mL Intravenous Q12H   Continuous Infusions:  sodium chloride     heparin 950 Units/hr (03/10/21 0728)   PRN Meds:.sodium chloride, acetaminophen **OR** acetaminophen, acetaminophen, nitroGLYCERIN, ondansetron (ZOFRAN) IV, ondansetron **OR** [DISCONTINUED] ondansetron (ZOFRAN) IV, sodium chloride flush  No  Known Allergies  Urinalysis: No results for input(s): COLORURINE, LABSPEC, PHURINE, GLUCOSEU, HGBUR, BILIRUBINUR, KETONESUR, PROTEINUR, UROBILINOGEN, NITRITE, LEUKOCYTESUR in the last 72 hours.  Invalid input(s): APPERANCEUR    Imaging: DG Chest 2 View  Result Date: 03/08/2021 CLINICAL DATA:  Chest pain.  Emesis. EXAM: CHEST - 2 VIEW COMPARISON:  Chest x-ray 03/03/2011. FINDINGS: The heart and mediastinal contours are within normal limits. Coronary artery stent. Aortic calcification. No focal consolidation. No pulmonary edema. No pleural effusion. No pneumothorax. No acute osseous abnormality. IMPRESSION: No active cardiopulmonary disease. Electronically Signed   By: Iven Finn M.D.   On: 03/08/2021 18:21   CT HEAD WO CONTRAST (5MM)  Result Date: 03/08/2021 CLINICAL  DATA:  Headache, intracranial hemorrhage suspected EXAM: CT HEAD WITHOUT CONTRAST TECHNIQUE: Contiguous axial images were obtained from the base of the skull through the vertex without intravenous contrast. COMPARISON:  None. FINDINGS: Brain: No acute hemorrhage. No subdural collection. Age related atrophy. Moderate area of encephalomalacia involving the right posterior parietooccipital lobe. Small area of encephalomalacia involving the left parietooccipital lobe. Remote lacunar infarcts in the basal ganglia and right thalamus. No evidence of acute ischemia. Mild background chronic small vessel ischemia. No hydrocephalus, midline shift, mass lesion/mass effect. Vascular: Dense atherosclerosis at the skull base. No hyperdense vessel. Skull: No fracture or focal lesion. Sinuses/Orbits: Opacification of right maxillary sinus is only partially included. There is right frontal sinus opacification with cortical thickening, chronic. Scattered mucosal thickening of ethmoid air cells. Minimal frothy debris in left side of sphenoid sinus. No mastoid effusion. Other: None. IMPRESSION: 1. No acute intracranial abnormality. 2. Age related atrophy and chronic small vessel ischemia. Remote infarcts in the bilateral parietooccipital lobes and basal ganglia. 3. Chronic right frontal and maxillary sinusitis. Electronically Signed   By: Keith Rake M.D.   On: 03/08/2021 21:34   CARDIAC CATHETERIZATION  Result Date: 03/09/2021 Conclusions: Multivessel coronary artery disease, as detailed below, including 60% mid LAD stenosis just distal to previously placed stent, multifocal codominant LCx disease of up to 80% in the distal vessel as well as thrombotic occlusion of large OM3 branch (culprit for patient's NSTEMI), and 60% proximal/mid RCA disease as well as 90% stenosis involving acute marginal branch. Widely patent mid LAD stent. Normal left ventricular filling pressure. Successful PCI to OM 3 using Onyx Frontier 2.5 x 22 mm  drug-eluting stent with 0% residual stenosis and TIMI-3 flow. Successful PCI to distal LCx using Onyx Frontier 2.75 x 12 mm drug-eluting stent with 0% residual stenosis and TIMI-3 flow. Recommendations: Admit to ICU for post STEMI care. Start IV heparin 2 hours after TR band removal given history of paroxysmal atrial fibrillation.  Anticipate transitioning to apixaban as soon as tomorrow if no evidence of bleeding/vascular complications. Anticipate triple therapy with aspirin, clopidogrel, and apixaban x1 week, after which time aspirin can be can discontinued and clopidogrel and apixaban maintained for 12 months. Obtain echocardiogram. Gentle post catheterization hydration with careful monitoring of renal function given patient's CKD and high risk for contrast-induced nephropathy. Aggressive secondary prevention. Nelva Bush, MD [facility]  CT Angio Chest Aorta W and/or Wo Contrast  Result Date: 03/08/2021 CLINICAL DATA:  Left chest pain EXAM: CT ANGIOGRAPHY CHEST WITH CONTRAST TECHNIQUE: Multidetector CT imaging of the chest was performed using the standard protocol during bolus administration of intravenous contrast. Multiplanar CT image reconstructions and MIPs were obtained to evaluate the vascular anatomy. CONTRAST:  31mL OMNIPAQUE IOHEXOL 350 MG/ML SOLN COMPARISON:  None. FINDINGS: Cardiovascular: On unenhanced CT, there is no  evidence of intramural hematoma. Following contrast administration, there is preferential opacification of the thoracic aorta. No evidence of thoracic aortic aneurysm or dissection. Although not tailored for evaluation of the pulmonary arteries, there is no evidence of pulmonary embolism to the lobar level. The heart is normal in size.  No pericardial effusion. Mediastinum/Nodes: No suspicious mediastinal lymphadenopathy. Visualized thyroid is unremarkable. Lungs/Pleura: Linear scarring/atelectasis in the lingula and bilateral lower lobes. No focal consolidation No suspicious  pulmonary nodules. No pleural effusion or pneumothorax. Upper Abdomen: Visualized upper abdomen is notable for prior cholecystectomy. Mildly prominent common duct, likely postsurgical. Suspected left renal cysts (series 5/image 89), incompletely visualized. Musculoskeletal: Degenerative changes of the visualized thoracolumbar spine. Review of the MIP images confirms the above findings. IMPRESSION: No evidence of thoracic aortic aneurysm or dissection. No evidence of pulmonary embolism. No evidence of acute cardiopulmonary disease. Electronically Signed   By: Julian Hy M.D.   On: 03/08/2021 23:42   ECHOCARDIOGRAM COMPLETE  Result Date: 03/09/2021    ECHOCARDIOGRAM REPORT   Patient Name:   Susan Mclean Date of Exam: 03/09/2021 Medical Rec #:  [id number]    Height:       61.0 in Accession #:    [id number]   Weight:       193.3 lb Date of Birth:  02-15-1951    BSA:          1.862 m Patient Age:    13 years     BP:           112/71 mmHg Patient Gender: F            HR:           65 bpm. Exam Location:  [facility] Procedure: 2D Echo, Color Doppler, Cardiac Doppler and Intracardiac            Opacification Agent Indications:     I21.9 Acute myocardial infarction, unspecified  History:         Patient has no prior history of Echocardiogram examinations.                  CHF; Risk Factors:Hypertension.  Sonographer:     Charmayne Sheer Referring Phys:  [phone] CHRISTOPHER END Diagnosing Phys: Kate Sable MD  Sonographer Comments: Suboptimal apical window and suboptimal subcostal window. Image acquisition challenging due to patient body habitus. IMPRESSIONS  1. Left ventricular ejection fraction, by estimation, is 55%. The left ventricle has normal function. The left ventricle demonstrates regional wall motion abnormalities (see scoring diagram/findings for description). There is mild left ventricular hypertrophy. Left ventricular diastolic parameters are consistent with Grade II diastolic dysfunction (pseudonormalization).  There is moderate hypokinesis of the left ventricular, basal-mid inferolateral wall.  2. Right ventricular systolic function is normal. The right ventricular size is normal.  3. The mitral valve is normal in structure. Mild mitral valve regurgitation.  4. The aortic valve was not well visualized. Aortic valve regurgitation is not visualized.  5. The inferior vena cava is normal in size with greater than 50% respiratory variability, suggesting right atrial pressure of 3 mmHg. FINDINGS  Left Ventricle: Left ventricular ejection fraction, by estimation, is 55%. The left ventricle has normal function. The left ventricle demonstrates regional wall motion abnormalities. Moderate hypokinesis of the left ventricular, basal-mid inferolateral wall. Definity contrast agent was given IV to delineate the left ventricular endocardial borders. The left ventricular internal cavity size was normal in size. There is mild left ventricular hypertrophy. Left ventricular diastolic parameters are consistent with Grade II  diastolic dysfunction (pseudonormalization). Right Ventricle: The right ventricular size is normal. No increase in right ventricular wall thickness. Right ventricular systolic function is normal. Left Atrium: Left atrial size was normal in size. Right Atrium: Right atrial size was normal in size. Pericardium: There is no evidence of pericardial effusion. Mitral Valve: The mitral valve is normal in structure. Mild mitral valve regurgitation. MV peak gradient, 1.3 mmHg. The mean mitral valve gradient is 1.0 mmHg. Tricuspid Valve: The tricuspid valve is normal in structure. Tricuspid valve regurgitation is mild. Aortic Valve: The aortic valve was not well visualized. Aortic valve regurgitation is not visualized. Aortic valve mean gradient measures 2.0 mmHg. Aortic valve peak gradient measures 3.8 mmHg. Aortic valve area, by VTI measures 2.48 cm. Pulmonic Valve: The pulmonic valve was not well visualized. Pulmonic valve  regurgitation is not visualized. Aorta: The aortic root is normal in size and structure. Venous: The inferior vena cava is normal in size with greater than 50% respiratory variability, suggesting right atrial pressure of 3 mmHg. IAS/Shunts: No atrial level shunt detected by color flow Doppler.  LEFT VENTRICLE PLAX 2D LVIDd:         3.70 cm   Diastology LVIDs:         2.40 cm   LV e' medial:    3.37 cm/s LV PW:         1.30 cm   LV E/e' medial:  17.8 LV IVS:        1.00 cm   LV e' lateral:   5.00 cm/s LVOT diam:     2.00 cm   LV E/e' lateral: 12.0 LV SV:         47 LV SV Index:   25 LVOT Area:     3.14 cm  LEFT ATRIUM             Index LA diam:        4.20 cm 2.26 cm/m LA Vol (A2C):   33.3 ml 17.89 ml/m LA Vol (A4C):   31.1 ml 16.70 ml/m LA Biplane Vol: 33.6 ml 18.05 ml/m  AORTIC VALVE                    PULMONIC VALVE AV Area (Vmax):    2.45 cm     PV Vmax:       0.82 m/s AV Area (Vmean):   2.38 cm     PV Vmean:      61.000 cm/s AV Area (VTI):     2.48 cm     PV VTI:        0.195 m AV Vmax:           98.10 cm/s   PV Peak grad:  2.7 mmHg AV Vmean:          71.800 cm/s  PV Mean grad:  2.0 mmHg AV VTI:            0.190 m AV Peak Grad:      3.8 mmHg AV Mean Grad:      2.0 mmHg LVOT Vmax:         76.40 cm/s LVOT Vmean:        54.300 cm/s LVOT VTI:          0.150 m LVOT/AV VTI ratio: 0.79  AORTA Ao Root diam: 2.80 cm MITRAL VALVE               TRICUSPID VALVE MV Area (PHT): 3.28 cm    TR Peak grad:  17.5 mmHg MV Area VTI:   2.89 cm    TR Vmax:        209.00 cm/s MV Peak grad:  1.3 mmHg MV Mean grad:  1.0 mmHg    SHUNTS MV Vmax:       0.56 m/s    Systemic VTI:  0.15 m MV Vmean:      35.6 cm/s   Systemic Diam: 2.00 cm MV Decel Time: 231 msec MV E velocity: 60.00 cm/s MV A velocity: 50.10 cm/s MV E/A ratio:  1.20 Kate Sable MD Electronically signed by Kate Sable MD Signature Date/Time: 03/09/2021/6:15:50 PM    Final       Assessment/Plan:  Susan Mclean is a 70 y.o. female with medical problems  of stroke, coronary disease with previous history of MI 2021, atrial fibrillation anticoagulation with Eliquis, CHF, hypertension, diabetes, CKD  was admitted on 03/08/2021 for :  NSTEMI (non-ST elevated myocardial infarction) ([place]) [I21.4] Hypertensive emergency [I16.1] Nonintractable headache, unspecified chronicity pattern, unspecified headache type [R51.9] Nausea and vomiting, unspecified vomiting type [R11.2] STEMI (ST elevation myocardial infarction) ([place]) [I21.3]  Assessment:   #Acute kidney injury secondary to contrast nephropathy #Chronic kidney disease stage IV.  Baseline creatinine 2.6/GFR 19 #Diabetes type 2 with CKD.  Hemoglobin A1c 6.2% from March 09, 2021  Imaging: CT angiogram chest: March 08, 2021: Left renal cysts Urinalysis: Not available 2D echo: March 09, 2021: LVEF 65%, grade 2 diastolic dysfunction, moderate hypokinesis of left ventricular, basal mid inferior lateral wall  AKI seems to be secondary to ATN from multiple recent contrast exposures.  Plan: We will obtain urinalysis Monitor urine output Avoid hypotension Electrolytes and volume status are acceptable.  No acute indication for dialysis at present Will prescribe Lokelma as potassium level is trending higher.   PS: Contrast nephropathy: ICD-10-CM N14.11 is a new 2023 ICD-10-CM code that became effective on February 05, 2021.   Tamberly Pomplun Candiss Norse 03/10/21

## 2021-03-10 NOTE — Progress Notes (Signed)
ANTICOAGULATION CONSULT NOTE  Pharmacy Consult for heparin infusion Indication: A Fib Per MD: Begin 2 hrs post TR band removal  No Known Allergies  Patient Measurements: Height: 5\' 1"  (154.9 cm) Weight: 87.7 kg (193 lb 5.5 oz) IBW/kg (Calculated) : 47.8 Heparin Dosing Weight: 73.4 kg  Vital Signs: Temp: 98.2 F (36.8 C) (11/03 0330) Temp Source: Oral (11/03 0330) BP: 108/62 (11/03 0600)  Labs: Recent Labs    03/08/21 1749 03/08/21 2057 03/09/21 0629 03/09/21 1328 03/09/21 1606 03/09/21 2005 03/10/21 0458  HGB 11.7*  --  11.8*  --   --   --  9.8*  HCT 34.2*  --  33.9*  --   --   --  29.9*  PLT 166  --  169  --   --   --  166  APTT  --   --   --   --   --  58* 157*  LABPROT  --   --   --   --  14.8  --   --   INR  --   --   --   --  1.2  --   --   HEPARINUNFRC  --   --   --   --   --  0.72* 1.09*  CREATININE 2.59*  --  2.35*  --   --   --  3.23*  TROPONINIHS 171* 1,735* >24,000* >24,000*  --   --   --      Estimated Creatinine Clearance: 16.3 mL/min (A) (by C-G formula based on SCr of 3.23 mg/dL (H)).   Medical History: Past Medical History:  Diagnosis Date   Atrial fibrillation (HCC)    CHF (congestive heart failure) (HCC)    HTN (hypertension)    Hypertension    MI (myocardial infarction) ([place])     Medications:  PTA med:  Eliquis  Assessment: Pt is 70 yo old female w/ hx of HTN and PE (~ 2 yrs ago) on Eliquis, post L heart cath & coronary angiography after presenting to ED w/ CP and diagnosed with NSTEMI.  Goal of Therapy:  Heparin level 0.3-0.7 units/ml Monitor platelets by anticoagulation protocol: Yes  11/03 0458 HL 1.09, aPTT 157, supratherapeutic   Plan:  Spoke with RN.  Hold heparin for 1 hour. Restart heparin infusion at 950 units/hr. Appear HL and aPTT are correlating Will recheck HL and aPTT 8 hr after restart, will confirm correlation, then follow only HL. CBC daily while on heparin.   Renda Rolls, PharmD, [facility] 03/10/2021 6:24  AM

## 2021-03-10 NOTE — Progress Notes (Signed)
Progress Note  Patient Name: Keyonta Madrid Date of Encounter: 03/10/2021  Primary Cardiologist: New to [facility] - consult by End  Subjective   She underwent urgent LHC in the early morning hours of 03/09/2021 with PCI/DES to the distal LCx and OM 3 as outlined below.  There was concern for contrast-induced nephropathy given underlying renal disease and in the context of the patient having previously undergone CTA that same day.  Renal function this morning is trending up with a BUN of 41 and a serum creatinine of 3.23 (previously 33/2.35 respectively 1 day prior).  Hemoglobin has trended from 11.8-9.8.  High-sensitivity troponin peaked at greater than 24,000.  Echo demonstrated a preserved LV systolic function with an EF of 55%, mild LVH, hypokinesis of the basal mid inferior lateral wall, normal RV systolic function and ventricular cavity size, and mild mitral regurgitation.  In follow-up today reports having some nausea this morning, now feels better Denies any chest pain, no shortness of breath Reports some constipation, " just need to get up and move"  Inpatient Medications    Scheduled Meds:  aspirin  81 mg Oral Daily   atorvastatin  80 mg Oral Daily   carvedilol  3.125 mg Oral BID WC   clopidogrel  75 mg Oral Q breakfast   insulin aspart  0-15 Units Subcutaneous TID WC   insulin aspart  0-5 Units Subcutaneous QHS   isosorbide mononitrate  30 mg Oral Daily   pantoprazole  20 mg Oral Daily   sodium chloride flush  3 mL Intravenous Q12H   sodium chloride flush  3 mL Intravenous Q12H   Continuous Infusions:  sodium chloride     heparin 950 Units/hr (03/10/21 0728)   PRN Meds: sodium chloride, acetaminophen **OR** acetaminophen, acetaminophen, nitroGLYCERIN, ondansetron (ZOFRAN) IV, ondansetron **OR** [DISCONTINUED] ondansetron (ZOFRAN) IV, sodium chloride flush   Vital Signs    Vitals:   03/10/21 0500 03/10/21 0600 03/10/21 0700 03/10/21 0800  BP: 125/67 108/62 135/73 (!)  152/76  Pulse:      Resp: 13 16 13 18   Temp:      TempSrc:      SpO2: 100% 100%    Weight:      Height:        Intake/Output Summary (Last 24 hours) at 03/10/2021 0816 Last data filed at 03/10/2021 0755 Gross per 24 hour  Intake 641.76 ml  Output 1050 ml  Net -408.24 ml   Filed Weights   03/08/21 1749 03/09/21 0554  Weight: 105.2 kg 87.7 kg    Telemetry    Normal sinus rhythm- Personally Reviewed  ECG    Pending - Personally Reviewed  Physical Exam   GEN: No acute distress.   Neck: No JVD. Cardiac: RRR, no murmurs, rubs, or gallops.  Respiratory: Clear to auscultation bilaterally.  GI: Soft, nontender, non-distended.   MS: No edema; No deformity. Neuro:  Alert and oriented x 3; Nonfocal.  Psych: Normal affect.  Labs    Chemistry Recent Labs  Lab 03/08/21 1749 03/08/21 2057 03/09/21 0629 03/10/21 0458  NA 136  --  136 137  K 4.8  --  4.7 5.0  CL 109  --  109 109  CO2 16*  --  19* 19*  GLUCOSE 154*  --  126* 83  BUN 36*  --  33* 41*  CREATININE 2.59*  --  2.35* 3.23*  CALCIUM 9.4  --  9.0 8.5*  PROT  --  9.0*  --   --  ALBUMIN  --  3.3*  --   --   AST  --  29  --   --   ALT  --  11  --   --   ALKPHOS  --  139*  --   --   BILITOT  --  0.9  --   --   GFRNONAA 19*  --  22* 15*  ANIONGAP 11  --  8 9     Hematology Recent Labs  Lab 03/08/21 1749 03/09/21 0629 03/10/21 0458  WBC 4.7 5.9 6.5  RBC 3.97 3.97 3.49*  HGB 11.7* 11.8* 9.8*  HCT 34.2* 33.9* 29.9*  MCV 86.1 85.4 85.7  MCH 29.5 29.7 28.1  MCHC 34.2 34.8 32.8  RDW 14.9 14.7 14.9  PLT 166 169 166    Cardiac EnzymesNo results for input(s): TROPONINI in the last 168 hours. No results for input(s): TROPIPOC in the last 168 hours.   BNP Recent Labs  Lab 03/08/21 1749  BNP 215.2*     DDimer No results for input(s): DDIMER in the last 168 hours.   Radiology    DG Chest 2 View  Result Date: 03/08/2021 IMPRESSION: No active cardiopulmonary disease. Electronically Signed   By:  Iven Finn M.D.   On: 03/08/2021 18:21   CT HEAD WO CONTRAST (5MM)  Result Date: 03/08/2021 IMPRESSION: 1. No acute intracranial abnormality. 2. Age related atrophy and chronic small vessel ischemia. Remote infarcts in the bilateral parietooccipital lobes and basal ganglia. 3. Chronic right frontal and maxillary sinusitis. Electronically Signed   By: Keith Rake M.D.   On: 03/08/2021 21:34   CT Angio Chest Aorta W and/or Wo Contrast  Result Date: 03/08/2021 IMPRESSION: No evidence of thoracic aortic aneurysm or dissection. No evidence of pulmonary embolism. No evidence of acute cardiopulmonary disease. Electronically Signed   By: Julian Hy M.D.   On: 03/08/2021 23:42   Cardiac Studies   2D echo 03/09/2021:  1. Left ventricular ejection fraction, by estimation, is 55%. The left  ventricle has normal function. The left ventricle demonstrates regional  wall motion abnormalities (see scoring diagram/findings for description).  There is mild left ventricular  hypertrophy. Left ventricular diastolic parameters are consistent with  Grade II diastolic dysfunction (pseudonormalization). There is moderate  hypokinesis of the left ventricular, basal-mid inferolateral wall.   2. Right ventricular systolic function is normal. The right ventricular  size is normal.   3. The mitral valve is normal in structure. Mild mitral valve  regurgitation.   4. The aortic valve was not well visualized. Aortic valve regurgitation  is not visualized.   5. The inferior vena cava is normal in size with greater than 50%  respiratory variability, suggesting right atrial pressure of 3 mmHg. __________  LHC 03/09/2021: Conclusions: Multivessel coronary artery disease, as detailed below, including 60% mid LAD stenosis just distal to previously placed stent, multifocal codominant LCx disease of up to 80% in the distal vessel as well as thrombotic occlusion of large OM3 branch (culprit for patient's  NSTEMI), and 60% proximal/mid RCA disease as well as 90% stenosis involving acute marginal branch. Widely patent mid LAD stent. Normal left ventricular filling pressure. Successful PCI to OM 3 using Onyx Frontier 2.5 x 22 mm drug-eluting stent with 0% residual stenosis and TIMI-3 flow. Successful PCI to distal LCx using Onyx Frontier 2.75 x 12 mm drug-eluting stent with 0% residual stenosis and TIMI-3 flow.   Recommendations: Admit to ICU for post STEMI care. Start IV heparin 2  hours after TR band removal given history of paroxysmal atrial fibrillation.  Anticipate transitioning to apixaban as soon as tomorrow if no evidence of bleeding/vascular complications. Anticipate triple therapy with aspirin, clopidogrel, and apixaban x1 week, after which time aspirin can be can discontinued and clopidogrel and apixaban maintained for 12 months. Obtain echocardiogram. Gentle post catheterization hydration with careful monitoring of renal function given patient's CKD and high risk for contrast-induced nephropathy. Aggressive secondary prevention.  Patient Profile     70 y.o. female with history of CAD with MI managed medically in the setting of prolonged hospitalization with intubation in [place] in 2021, chronic heart failure with prior details unknown, PAF, CVA, HTN, HLD, diabetes mellitus, and CKD who we are seeing for high risk NSTEMI.  Assessment & Plan    1.  CAD involving native coronary arteries with high risk NSTEMI: -She underwent successful PCI/DES to the distal LCx and OM 3 in the early morning hours of 03/09/2021 as outlined above -Worsening renal function, creatinine greater than 3 On heparin, aspirin Plavix Plan was to use a NOAC with aspirin Plavix 1 week then hold the aspirin -Recommend for now we trend the creatinine before transitioning to NOAC.  Markedly depressed creatinine clearance of 12  2.  HFpEF: Previously managed in [place] -Echo this admission demonstrates preserved LV  systolic function with grade 2 diastolic dysfunction -Normal LV filling pressures during catheterization  3.  PAF: Maintaining normal sinus rhythm -CHA2DS2-VASc at least 8 (CHF, HTN, age x1, DM, CVA x2, vascular disease, sex category) Plan to transition to NOAC once renal function stabilizes  4.  Acute on CKD: -Baseline serum creatinine is unknown with an initial presenting serum creatinine of 2.5 Now up to greater than 3, likely contrast-induced nephropathy  had already received IV contrast for CTA that same day -Continue IV hydration -Would recommend nephrology consult as she has previously required temporary hemodialysis during extended hospitalization in [place] in 2021  5.  HTN: -Blood pressure was severely elevated upon admission Now hypotensive, hold parameters for carvedilol and isosorbide  6.  HLD: -Check lipid panel -Goal LDL less than 70 -On Lipitor 80  Long discussion with patient concerning recent events, case discussed with nursing  Total encounter time more than 35 minutes  Greater than 50% was spent in counseling and coordination of care with the patient   For questions or updates, please contact [place] Please consult [email or web address] for contact info under Cardiology/STEMI.    Signed, Esmond Plants, MD, Ph.D [facility]

## 2021-03-10 NOTE — Progress Notes (Signed)
ANTICOAGULATION CONSULT NOTE  Pharmacy Consult for heparin infusion Indication: AFib  Patient Measurements: Height: 5\' 1"  (154.9 cm) Weight: 87.7 kg (193 lb 5.5 oz) IBW/kg (Calculated) : 47.8 Heparin Dosing Weight: 73.4 kg  Vital Signs: Temp: 98.2 F (36.8 C) (11/03 1600) Temp Source: Oral (11/03 0800) BP: 100/62 (11/03 1700)  Labs: Recent Labs    03/08/21 1749 03/08/21 2057 03/09/21 0629 03/09/21 1328 03/09/21 1606 03/09/21 2005 03/10/21 0458 03/10/21 1521  HGB 11.7*  --  11.8*  --   --   --  9.8*  --   HCT 34.2*  --  33.9*  --   --   --  29.9*  --   PLT 166  --  169  --   --   --  166  --   APTT  --   --   --   --   --  58* 157* 90*  LABPROT  --   --   --   --  14.8  --   --   --   INR  --   --   --   --  1.2  --   --   --   HEPARINUNFRC  --   --   --   --   --  0.72* 1.09* 0.74*  CREATININE 2.59*  --  2.35*  --   --   --  3.23*  --   TROPONINIHS 171* 1,735* >24,000* >24,000*  --   --   --   --      Estimated Creatinine Clearance: 16.3 mL/min (A) (by C-G formula based on SCr of 3.23 mg/dL (H)).   Medical History: Past Medical History:  Diagnosis Date   Atrial fibrillation (HCC)    CHF (congestive heart failure) (HCC)    HTN (hypertension)    Hypertension    MI (myocardial infarction) ([place])     Medications:  PTA med:  Eliquis  Assessment: Pt is 70 yo old female w/ hx of HTN and PE (~ 2 yrs ago) on Eliquis, post L heart cath & coronary angiography after presenting to ED w/ CP and diagnosed with NSTEMI.  Goal of Therapy:  Heparin level 0.3-0.7 units/ml Monitor platelets by anticoagulation protocol: Yes  1103 0458 HL 1.09, aPTT 157, supratherapeutic 1103 1521 HL 0.74, aPTT 90, therapeutic   Plan:  aPTT is therapeutic x 1. HL slightly supratherapeutic. Correlation not yet established Continue heparin infusion at 950 units/hr Will recheck HL and aPTT in 8 hr, will confirm correlation, then follow only HL CBC daily while on heparin  Benita Gutter   03/10/2021 7:24 PM

## 2021-03-11 DIAGNOSIS — R112 Nausea with vomiting, unspecified: Secondary | ICD-10-CM

## 2021-03-11 DIAGNOSIS — N179 Acute kidney failure, unspecified: Secondary | ICD-10-CM

## 2021-03-11 DIAGNOSIS — I480 Paroxysmal atrial fibrillation: Secondary | ICD-10-CM

## 2021-03-11 DIAGNOSIS — N189 Chronic kidney disease, unspecified: Secondary | ICD-10-CM

## 2021-03-11 LAB — CBC
HCT: 30.2 % — ABNORMAL LOW (ref 36.0–46.0)
Hemoglobin: 9.8 g/dL — ABNORMAL LOW (ref 12.0–15.0)
MCH: 27.8 pg (ref 26.0–34.0)
MCHC: 32.5 g/dL (ref 30.0–36.0)
MCV: 85.6 fL (ref 80.0–100.0)
Platelets: 153 K/uL (ref 150–400)
RBC: 3.53 MIL/uL — ABNORMAL LOW (ref 3.87–5.11)
RDW: 15.2 % (ref 11.5–15.5)
WBC: 6.5 K/uL (ref 4.0–10.5)
nRBC: 0 % (ref 0.0–0.2)

## 2021-03-11 LAB — GLUCOSE, CAPILLARY
Glucose-Capillary: 86 mg/dL (ref 70–99)
Glucose-Capillary: 112 mg/dL — ABNORMAL HIGH (ref 70–99)
Glucose-Capillary: 102 mg/dL — ABNORMAL HIGH (ref 70–99)
Glucose-Capillary: 93 mg/dL (ref 70–99)

## 2021-03-11 LAB — BASIC METABOLIC PANEL
Anion gap: 9 (ref 5–15)
BUN: 44 mg/dL — ABNORMAL HIGH (ref 8–23)
CO2: 17 mmol/L — ABNORMAL LOW (ref 22–32)
Calcium: 8.4 mg/dL — ABNORMAL LOW (ref 8.9–10.3)
Chloride: 109 mmol/L (ref 98–111)
Creatinine, Ser: 3.78 mg/dL — ABNORMAL HIGH (ref 0.44–1.00)
GFR, Estimated: 12 mL/min — ABNORMAL LOW (ref >60)
Glucose, Bld: 84 mg/dL (ref 70–99)
Potassium: 4.7 mmol/L (ref 3.5–5.1)
Sodium: 135 mmol/L (ref 135–145)

## 2021-03-11 LAB — CBC WITH DIFFERENTIAL/PLATELET
Abs Immature Granulocytes: 0.05 10*3/uL (ref 0.00–0.07)
Basophils Absolute: 0 10*3/uL (ref 0.0–0.1)
Basophils Relative: 1 %
Eosinophils Absolute: 0.2 10*3/uL (ref 0.0–0.5)
Eosinophils Relative: 3 %
HCT: 28.4 % — ABNORMAL LOW (ref 36.0–46.0)
Hemoglobin: 9.4 g/dL — ABNORMAL LOW (ref 12.0–15.0)
Immature Granulocytes: 1 %
Lymphocytes Relative: 19 %
Lymphs Abs: 1.2 10*3/uL (ref 0.7–4.0)
MCH: 28 pg (ref 26.0–34.0)
MCHC: 33.1 g/dL (ref 30.0–36.0)
MCV: 84.5 fL (ref 80.0–100.0)
Monocytes Absolute: 0.6 10*3/uL (ref 0.1–1.0)
Monocytes Relative: 9 %
Neutro Abs: 4 10*3/uL (ref 1.7–7.7)
Neutrophils Relative %: 67 %
Platelets: 147 10*3/uL — ABNORMAL LOW (ref 150–400)
RBC: 3.36 MIL/uL — ABNORMAL LOW (ref 3.87–5.11)
RDW: 15 % (ref 11.5–15.5)
WBC: 6.1 10*3/uL (ref 4.0–10.5)
nRBC: 0 % (ref 0.0–0.2)

## 2021-03-11 LAB — HEPARIN LEVEL (UNFRACTIONATED)
Heparin Unfractionated: 0.52 IU/mL (ref 0.30–0.70)
Heparin Unfractionated: 0.84 IU/mL — ABNORMAL HIGH (ref 0.30–0.70)
Heparin Unfractionated: 0.94 [IU]/mL — ABNORMAL HIGH (ref 0.30–0.70)

## 2021-03-11 LAB — APTT
aPTT: 130 seconds — ABNORMAL HIGH (ref 24–36)
aPTT: 63 seconds — ABNORMAL HIGH (ref 24–36)
aPTT: 99 s — ABNORMAL HIGH (ref 24–36)

## 2021-03-11 LAB — HEPATITIS B SURFACE ANTIBODY, QUANTITATIVE: Hep B S AB Quant (Post): <3.1 m[IU]/mL — ABNORMAL LOW (ref >9.9)

## 2021-03-11 LAB — MAGNESIUM: Magnesium: 1.8 mg/dL (ref 1.7–2.4)

## 2021-03-11 MED ORDER — HEPARIN BOLUS VIA INFUSION
1100.0000 [IU] | Freq: Once | INTRAVENOUS | Status: AC
Start: 2021-03-11 — End: 2021-03-11
  Administered 2021-03-11: 1100 [IU] via INTRAVENOUS
  Filled 2021-03-11: qty 1100

## 2021-03-11 NOTE — Progress Notes (Signed)
ANTICOAGULATION CONSULT NOTE  Pharmacy Consult for heparin infusion Indication: AFib  Patient Measurements: Height: 5\' 1"  (154.9 cm) Weight: 87.7 kg (193 lb 5.5 oz) IBW/kg (Calculated) : 47.8 Heparin Dosing Weight: 73.4 kg  Vital Signs: Temp: 98.3 F (36.8 C) (11/04 0000) Temp Source: Oral (11/04 0000) BP: 143/71 (11/04 0000)  Labs: Recent Labs    03/08/21 1749 03/08/21 2057 03/09/21 0629 03/09/21 1328 03/09/21 1606 03/09/21 2005 03/10/21 0458 03/10/21 1521 03/11/21 0009  HGB 11.7*  --  11.8*  --   --   --  9.8*  --  9.8*  HCT 34.2*  --  33.9*  --   --   --  29.9*  --  30.2*  PLT 166  --  169  --   --   --  166  --  153  APTT  --   --   --   --   --    < > 157* 90* 99*  LABPROT  --   --   --   --  14.8  --   --   --   --   INR  --   --   --   --  1.2  --   --   --   --   HEPARINUNFRC  --   --   --   --   --    < > 1.09* 0.74* 0.94*  CREATININE 2.59*  --  2.35*  --   --   --  3.23*  --   --   TROPONINIHS 171* 1,735* >24,000* >24,000*  --   --   --   --   --    < > = values in this interval not displayed.     Estimated Creatinine Clearance: 16.3 mL/min (A) (by C-G formula based on SCr of 3.23 mg/dL (H)).   Medical History: Past Medical History:  Diagnosis Date   Atrial fibrillation (HCC)    CHF (congestive heart failure) (HCC)    HTN (hypertension)    Hypertension    MI (myocardial infarction) ([place])     Medications:  PTA med:  Eliquis  Assessment: Pt is 70 yo old female w/ hx of HTN and PE (~ 2 yrs ago) on Eliquis, post L heart cath & coronary angiography after presenting to ED w/ CP and diagnosed with NSTEMI.  Goal of Therapy:  Heparin level 0.3-0.7 units/ml Monitor platelets by anticoagulation protocol: Yes Goal aPTT 66-102  1103 0458 HL 1.09, aPTT 157, supratherapeutic 1103 1521 HL 0.74, aPTT 90, therapeutic 1104 0009 HL 0.94, aPTT 99   Plan:  aPTT is therapeutic x 2. HL supratherapeutic. Correlation not yet established Continue heparin  infusion at 950 units/hr Will recheck HL and aPTT with AM labs  CBC daily while on heparin  Renda Rolls, PharmD, [facility] 03/11/2021 1:19 AM

## 2021-03-11 NOTE — Progress Notes (Signed)
   03/11/21 1115  Clinical Encounter Type  Visited With Patient  Visit Type Initial;Spiritual support;Social support  Referral From Chaplain  Consult/Referral To [place] visited with PT at bedside. PT was able to talk about her medical history, and her origin of what is was like be raised in [place], [place]. PT spoke of family dynamics and spoke of her love for her children. Chaplain will try to follow up with PT, who enjoyed the company of a Chaplain.   Andee Poles, M. Div.

## 2021-03-11 NOTE — Progress Notes (Signed)
Susan Mclean at [place] NAME: Susan Mclean    MR#:  [id number]  DATE OF BIRTH:  1950/12/29  SUBJECTIVE:   patient overall had an uneventful day yesterday. Denies any complaints. Trying to drink water. No chest pain or shortness of breath REVIEW OF SYSTEMS:   Review of Systems  Constitutional:  Negative for chills, fever and weight loss.  HENT:  Negative for ear discharge, ear pain and nosebleeds.   Eyes:  Negative for blurred vision, pain and discharge.  Respiratory:  Negative for sputum production, shortness of breath, wheezing and stridor.   Cardiovascular:  Negative for chest pain, palpitations, orthopnea and PND.  Gastrointestinal:  Negative for abdominal pain, diarrhea, nausea and vomiting.  Genitourinary:  Negative for frequency and urgency.  Musculoskeletal:  Negative for back pain and joint pain.  Neurological:  Negative for sensory change, speech change, focal weakness and weakness.  Psychiatric/Behavioral:  Negative for depression and hallucinations. The patient is not nervous/anxious.   Tolerating Diet:yes Tolerating PT: HHPT  DRUG ALLERGIES:  No Known Allergies  VITALS:  Blood pressure 139/67, pulse 75, temperature 99 F (37.2 C), temperature source Oral, resp. rate 20, height 5\' 1"  (1.549 m), weight 87.7 kg, SpO2 99 %.  PHYSICAL EXAMINATION:   Physical Exam  GENERAL:  70 y.o.-year-old patient lying in the bed with no acute distress. obese LUNGS: Normal breath sounds bilaterally, no wheezing, rales, rhonchi. No use of accessory muscles of respiration.  CARDIOVASCULAR: S1, S2 normal. No murmurs, rubs, or gallops.  ABDOMEN: Soft, nontender, nondistended. Bowel sounds present. No organomegaly or mass.  EXTREMITIES: No cyanosis, clubbing or edema b/l.    NEUROLOGIC: non focal PSYCHIATRIC:  patient is alert and oriented x 3.  SKIN: No obvious rash, lesion, or ulcer.   LABORATORY PANEL:  CBC Recent Labs  Lab 03/11/21 0427   WBC 6.1  HGB 9.4*  HCT 28.4*  PLT 147*     Chemistries  Recent Labs  Lab 03/08/21 2057 03/09/21 0629 03/11/21 0427  NA  --    < > 135  K  --    < > 4.7  CL  --    < > 109  CO2  --    < > 17*  GLUCOSE  --    < > 84  BUN  --    < > 44*  CREATININE  --    < > 3.78*  CALCIUM  --    < > 8.4*  MG 1.8  --  1.8  AST 29  --   --   ALT 11  --   --   ALKPHOS 139*  --   --   BILITOT 0.9  --   --    < > = values in this interval not displayed.    Cardiac Enzymes No results for input(s): TROPONINI in the last 168 hours. RADIOLOGY:  No results found. ASSESSMENT AND PLAN:   Susan Mclean is a 70 y.o. female with medical history significant for  CVA, CAD with history of previous MI in [place], A. fib on Eliquis, CHF, HTN and DM presenting with severe substernal and left-sided chest pain like a heaviness that started around 1400 Pain was severe, constant, and radiating to the back and left arm . She stated since the day prior she was having nausea and vomiting , up until the onset of the pain.     NSTEMI s/p PCI 03/09/21   CAD with hx  MI 2021 w/ stent LAD - Patient with history of CAD and prior MI with stent presenting with NSTEMI, taken emergently to Cath Lab due to intractable pain unrelieved with IV nitroglycerin--now off - s/p DES to OM 3 and LCx and is chest pain-free - [facility] cardiology consult appreciated --11/3-- cont iv heparin gtt for now per Dr Rockey Situ -- Per Dr End ---recommends aspirin for one week, Plavix 75 mg daily for at least 12 months and resume eliquis at discharge     DM type 2 (diabetes mellitus, type 2)  - Sliding scale insulin coverage --sugars stable -- a1c 6.2%  Acute on CKD-IV suspect contrast induced Baseline creat 2.5 --creat upto 3.25 after cath and CT angio chest--3.78 --good UOP -- nephrology consultation with Dr. Candiss Norse appreciated. Avoid nephrotoxins    Atrial fibrillation (HCC) Chronic anticoagulation at home (on eliquis) - patient  currently on IV heparin drip for now while monitoring hgb --hgb 11.8--9.8. no bleeding--9.4   obesity, Class III, BMI 40-49.9 (morbid obesity) ([place]) - Complicating factor to overall prognosis and care     HTN (hypertension) - continue carvedilol and imdur   okay to transfer out of ICU per cardiology  Procedures: cardiac cath Family communication : son Susan Mclean on the phone 11/4 Consults : [facility] cardiology CODE STATUS: full DVT Prophylaxis : heparin drip Level of care: Med-Surg Status is: Inpatient  Remains inpatient appropriate because: non-STEMI status post stent times two. Cardiology recommends monitor hemoglobin in-house still not ready to transition to po DOAC  TOTAL TIME TAKING CARE OF THIS PATIENT: 25 minutes.  >50% time spent on counselling and coordination of care  Note: This dictation was prepared with Dragon dictation along with smaller phrase technology. Any transcriptional errors that result from this process are unintentional.  Susan Mclean M.D    [facility]   CC: Primary care physician; No primary care provider on file. Patient ID: Susan Mclean, female   DOB: 1951-04-07, 70 y.o.   MRN: [id number]

## 2021-03-11 NOTE — Progress Notes (Signed)
ANTICOAGULATION CONSULT NOTE  Pharmacy Consult for heparin infusion Indication: AFib  Patient Measurements: Height: 5\' 1"  (154.9 cm) Weight: 87.7 kg (193 lb 5.5 oz) IBW/kg (Calculated) : 47.8 Heparin Dosing Weight: 73.4 kg  Vital Signs: Temp: 98.4 F (36.9 C) (11/04 0400) Temp Source: Oral (11/04 0400) BP: 124/71 (11/04 0600)  Labs: Recent Labs    03/08/21 2057 03/09/21 0629 03/09/21 1328 03/09/21 1606 03/09/21 2005 03/10/21 0458 03/10/21 1521 03/11/21 0009 03/11/21 0427  HGB  --  11.8*  --   --   --  9.8*  --  9.8* 9.4*  HCT  --  33.9*  --   --   --  29.9*  --  30.2* 28.4*  PLT  --  169  --   --   --  166  --  153 147*  APTT  --   --   --   --    < > 157* 90* 99* 130*  LABPROT  --   --   --  14.8  --   --   --   --   --   INR  --   --   --  1.2  --   --   --   --   --   HEPARINUNFRC  --   --   --   --    < > 1.09* 0.74* 0.94* 0.84*  CREATININE  --  2.35*  --   --   --  3.23*  --   --  3.78*  TROPONINIHS 1,735* >24,000* >24,000*  --   --   --   --   --   --    < > = values in this interval not displayed.     Estimated Creatinine Clearance: 13.9 mL/min (A) (by C-G formula based on SCr of 3.78 mg/dL (H)).   Medical History: Past Medical History:  Diagnosis Date   Atrial fibrillation (HCC)    CHF (congestive heart failure) (HCC)    HTN (hypertension)    Hypertension    MI (myocardial infarction) ([place])     Medications:  PTA med:  Eliquis  Assessment: Pt is 70 yo old female w/ hx of HTN and PE (~ 2 yrs ago) on Eliquis, post L heart cath & coronary angiography after presenting to ED w/ CP and diagnosed with NSTEMI.  Goal of Therapy:  Heparin level 0.3-0.7 units/ml Monitor platelets by anticoagulation protocol: Yes Goal aPTT 66-102  1103 0458 HL 1.09, aPTT 157, supratherapeutic 1103 1521 HL 0.74, aPTT 90, therapeutic 1104 0009 HL 0.94, aPTT 99 1104 0427 HL 0.84, aPTT 130, supratherapeutic   Plan:  aPTT is supratherapeutic. HL supratherapeutic.  Correlation not yet established Decrease heparin infusion at 800 units/hr Recheck HL and aPTT in 8 hr after rate change  CBC daily while on heparin  Renda Rolls, PharmD, [facility] 03/11/2021 6:49 AM

## 2021-03-11 NOTE — Progress Notes (Signed)
ANTICOAGULATION CONSULT NOTE  Pharmacy Consult for heparin infusion Indication: AFib  Patient Measurements: Height: 5\' 1"  (154.9 cm) Weight: 87.7 kg (193 lb 5.5 oz) IBW/kg (Calculated) : 47.8 Heparin Dosing Weight: 73.4 kg  Vital Signs: Temp: 99 F (37.2 C) (11/04 0800) Temp Source: Oral (11/04 0800) BP: 158/127 (11/04 1400)  Labs: Recent Labs    03/08/21 2057 03/09/21 0629 03/09/21 1328 03/09/21 1606 03/09/21 2005 03/10/21 0458 03/10/21 1521 03/11/21 0009 03/11/21 0427 03/11/21 1450  HGB  --  11.8*  --   --   --  9.8*  --  9.8* 9.4*  --   HCT  --  33.9*  --   --   --  29.9*  --  30.2* 28.4*  --   PLT  --  169  --   --   --  166  --  153 147*  --   APTT  --   --   --   --    < > 157*   < > 99* 130* 63*  LABPROT  --   --   --  14.8  --   --   --   --   --   --   INR  --   --   --  1.2  --   --   --   --   --   --   HEPARINUNFRC  --   --   --   --    < > 1.09*   < > 0.94* 0.84* 0.52  CREATININE  --  2.35*  --   --   --  3.23*  --   --  3.78*  --   TROPONINIHS 1,735* >24,000* >24,000*  --   --   --   --   --   --   --    < > = values in this interval not displayed.     Estimated Creatinine Clearance: 13.9 mL/min (A) (by C-G formula based on SCr of 3.78 mg/dL (H)).   Medical History: Past Medical History:  Diagnosis Date   Atrial fibrillation (HCC)    CHF (congestive heart failure) (HCC)    HTN (hypertension)    Hypertension    MI (myocardial infarction) ([place])     Medications:  PTA med:  Eliquis  Assessment: Pt is 70 yo old female w/ hx of HTN and PE (~ 2 yrs ago) on Eliquis, post L heart cath & coronary angiography after presenting to ED w/ CP and diagnosed with NSTEMI.  Goal of Therapy:  Heparin level 0.3-0.7 units/ml Monitor platelets by anticoagulation protocol: Yes Goal aPTT 66-102  1103 0458 HL 1.09, aPTT 157, supratherapeutic 1103 1521 HL 0.74, aPTT 90, therapeutic 1104 0009 HL 0.94, aPTT 99 1104 0427 HL 0.84, aPTT 130, supratherapeutic 1104  1450 aPTT 63, subtherapeutic   Plan:  aPTT subtherapeutic. Will give 1100 bolus and increase heparin infusion to 950 units/hr Recheck aPTT in 8 hours after rate change Recheck HL with AM labs and switch to monitoring HL once correlating with aPTT  CBC daily while on heparin  Sherilyn Banker, PharmD Clinical Pharmacist 03/11/2021 3:46 PM

## 2021-03-11 NOTE — Progress Notes (Signed)
Progress Note  Patient Name: Susan Mclean Date of Encounter: 03/11/2021  Primary Cardiologist: New to [facility] - consult by End  Subjective   No chest pain or dyspnea. Renal function continues to trend up, though trend is slowing. Vitals stable. HGB stable.   Inpatient Medications    Scheduled Meds:  aspirin  81 mg Oral Daily   atorvastatin  80 mg Oral Daily   Chlorhexidine Gluconate Cloth  6 each Topical Daily   clopidogrel  75 mg Oral Q breakfast   insulin aspart  0-15 Units Subcutaneous TID WC   insulin aspart  0-5 Units Subcutaneous QHS   pantoprazole  20 mg Oral Daily   sodium chloride flush  3 mL Intravenous Q12H   sodium chloride flush  3 mL Intravenous Q12H   sodium zirconium cyclosilicate  10 g Oral Daily   Continuous Infusions:  sodium chloride     heparin 800 Units/hr (03/11/21 0653)   PRN Meds: sodium chloride, acetaminophen **OR** acetaminophen, acetaminophen, nitroGLYCERIN, ondansetron (ZOFRAN) IV, ondansetron **OR** [DISCONTINUED] ondansetron (ZOFRAN) IV, sodium chloride flush   Vital Signs    Vitals:   03/11/21 0400 03/11/21 0500 03/11/21 0600 03/11/21 0700  BP: 121/71 120/74 124/71 140/67  Pulse:      Resp: 14 17 20 15   Temp: 98.4 F (36.9 C)     TempSrc: Oral     SpO2:    99%  Weight:      Height:        Intake/Output Summary (Last 24 hours) at 03/11/2021 0808 Last data filed at 03/11/2021 0600 Gross per 24 hour  Intake 572.65 ml  Output 1050 ml  Net -477.35 ml    Filed Weights   03/08/21 1749 03/09/21 0554  Weight: 105.2 kg 87.7 kg    Telemetry    SR with 1st degree AV block - Personally Reviewed  ECG    Pending - Personally Reviewed  Physical Exam   GEN: No acute distress.   Neck: No JVD. Cardiac: RRR, no murmurs, rubs, or gallops. Radial pulse 2+ proximal and distal to the arteriotomy site.  Respiratory: Clear to auscultation bilaterally.  GI: Soft, nontender, non-distended.   MS: No edema; No deformity. Neuro:  Alert and  oriented x 3; Nonfocal.  Psych: Normal affect.  Labs    Chemistry Recent Labs  Lab 03/08/21 2057 03/09/21 0629 03/10/21 0458 03/11/21 0427  NA  --  136 137 135  K  --  4.7 5.0 4.7  CL  --  109 109 109  CO2  --  19* 19* 17*  GLUCOSE  --  126* 83 84  BUN  --  33* 41* 44*  CREATININE  --  2.35* 3.23* 3.78*  CALCIUM  --  9.0 8.5* 8.4*  PROT 9.0*  --   --   --   ALBUMIN 3.3*  --   --   --   AST 29  --   --   --   ALT 11  --   --   --   ALKPHOS 139*  --   --   --   BILITOT 0.9  --   --   --   GFRNONAA  --  22* 15* 12*  ANIONGAP  --  8 9 9       Hematology Recent Labs  Lab 03/10/21 0458 03/11/21 0009 03/11/21 0427  WBC 6.5 6.5 6.1  RBC 3.49* 3.53* 3.36*  HGB 9.8* 9.8* 9.4*  HCT 29.9* 30.2* 28.4*  MCV 85.7  85.6 84.5  MCH 28.1 27.8 28.0  MCHC 32.8 32.5 33.1  RDW 14.9 15.2 15.0  PLT 166 153 147*     Cardiac EnzymesNo results for input(s): TROPONINI in the last 168 hours. No results for input(s): TROPIPOC in the last 168 hours.   BNP Recent Labs  Lab 03/08/21 1749  BNP 215.2*      DDimer No results for input(s): DDIMER in the last 168 hours.   Radiology    DG Chest 2 View  Result Date: 03/08/2021 IMPRESSION: No active cardiopulmonary disease. Electronically Signed   By: Iven Finn M.D.   On: 03/08/2021 18:21   CT HEAD WO CONTRAST (5MM)  Result Date: 03/08/2021 IMPRESSION: 1. No acute intracranial abnormality. 2. Age related atrophy and chronic small vessel ischemia. Remote infarcts in the bilateral parietooccipital lobes and basal ganglia. 3. Chronic right frontal and maxillary sinusitis. Electronically Signed   By: Keith Rake M.D.   On: 03/08/2021 21:34   CT Angio Chest Aorta W and/or Wo Contrast  Result Date: 03/08/2021 IMPRESSION: No evidence of thoracic aortic aneurysm or dissection. No evidence of pulmonary embolism. No evidence of acute cardiopulmonary disease. Electronically Signed   By: Julian Hy M.D.   On: 03/08/2021 23:42    Cardiac Studies   2D echo 03/09/2021:  1. Left ventricular ejection fraction, by estimation, is 55%. The left  ventricle has normal function. The left ventricle demonstrates regional  wall motion abnormalities (see scoring diagram/findings for description).  There is mild left ventricular  hypertrophy. Left ventricular diastolic parameters are consistent with  Grade II diastolic dysfunction (pseudonormalization). There is moderate  hypokinesis of the left ventricular, basal-mid inferolateral wall.   2. Right ventricular systolic function is normal. The right ventricular  size is normal.   3. The mitral valve is normal in structure. Mild mitral valve  regurgitation.   4. The aortic valve was not well visualized. Aortic valve regurgitation  is not visualized.   5. The inferior vena cava is normal in size with greater than 50%  respiratory variability, suggesting right atrial pressure of 3 mmHg. __________  LHC 03/09/2021: Conclusions: Multivessel coronary artery disease, as detailed below, including 60% mid LAD stenosis just distal to previously placed stent, multifocal codominant LCx disease of up to 80% in the distal vessel as well as thrombotic occlusion of large OM3 branch (culprit for patient's NSTEMI), and 60% proximal/mid RCA disease as well as 90% stenosis involving acute marginal branch. Widely patent mid LAD stent. Normal left ventricular filling pressure. Successful PCI to OM 3 using Onyx Frontier 2.5 x 22 mm drug-eluting stent with 0% residual stenosis and TIMI-3 flow. Successful PCI to distal LCx using Onyx Frontier 2.75 x 12 mm drug-eluting stent with 0% residual stenosis and TIMI-3 flow.   Recommendations: Admit to ICU for post STEMI care. Start IV heparin 2 hours after TR band removal given history of paroxysmal atrial fibrillation.  Anticipate transitioning to apixaban as soon as tomorrow if no evidence of bleeding/vascular complications. Anticipate triple therapy with  aspirin, clopidogrel, and apixaban x1 week, after which time aspirin can be can discontinued and clopidogrel and apixaban maintained for 12 months. Obtain echocardiogram. Gentle post catheterization hydration with careful monitoring of renal function given patient's CKD and high risk for contrast-induced nephropathy. Aggressive secondary prevention.  Patient Profile     70 y.o. female with history of CAD with MI managed medically in the setting of prolonged hospitalization with intubation in [place] in 2021, chronic heart failure with  prior details unknown, PAF, CVA, HTN, HLD, diabetes mellitus, and CKD who we are seeing for high risk NSTEMI.  Assessment & Plan    1.  CAD involving native coronary arteries with high risk NSTEMI: -She underwent successful PCI/DES to the distal LCx and OM 3 in the early morning hours of 03/09/2021 as outlined above -Worsening renal function, with concern for contrast-induced nephropathy given she underwent CTA chest and LHC the same day -On heparin, aspirin, Plavix -Plan was to use a DOAC with aspirin and Plavix for one week then hold the aspirin -Recommend for now we trend the creatinine before transitioning to DOAC or Coumadin  2.  HFpEF: -Previously managed in [place] -Echo this admission demonstrates preserved LV systolic function with grade 2 diastolic dysfunction -Normal LV filling pressures during catheterization -Maintain net equal fluid balance   3.  PAF: -Maintaining normal sinus rhythm, not requiring AV nodal blocking medications  -CHA2DS2-VASc at least 8 (CHF, HTN, age x1, DM, CVA x2, vascular disease, sex category) -Plan to transition to [place] or Coumadin once renal function stabilizes  4.  Acute on CKD: -Baseline serum creatinine is unknown with an initial presenting serum creatinine of 2.5 -Following chest CTA and LHC, SCr 3.73, likely contrast-induced nephropathy  -Continue IV hydration -Would recommend nephrology consult as she has  previously required temporary hemodialysis during extended hospitalization in [place] in 2021 -Monitor -Avoid nephro-toxic agents  5.  HTN: -Blood pressure was severely elevated upon admission -Improved  6.  HLD: -LDL 60 -On Lipitor 80     For questions or updates, please contact [place] Please consult [email or web address] for contact info under Cardiology/STEMI.    Signed, Esmond Plants, MD, Ph.D [facility]

## 2021-03-11 NOTE — Evaluation (Signed)
Physical Therapy Evaluation Patient Details Name: Susan Mclean MRN: [id number] DOB: February 05, 1951 Today's Date: 03/11/2021  History of Present Illness  Pt is a 70 y.o. female presenting to hospital 11/1 with chest pain, N/V, HA, and some discomfort in back and L arm; noted with elevated troponin.  Pt admitted with NSTEMI, acute on CKD-IV, and a-fib.  S/p PCI/DES to distal LCx and OM3 (cath lab) 03/09/21.  PMH includes CVA, CAD, h/o MI, paroxysmal A-fib on Eliquis, CHF, htn, DM, HDL.  Pt reports h/o impaired vision.  Clinical Impression  Prior to hospital admission, pt was modified independent ambulating with rollator (limited distance ambulating prior to hospitalization of 1/2 block walking d/t dizziness and shakes so pt needing seat on walker to sit on when needed); pt reports h/o vision impairments and L foot drop; and lives with her son in 1 level home with 2-3 STE (no railing).  Currently pt is SBA with bed mobility; CGA with transfers; and CGA ambulating 100 feet with youth sized RW (L foot drop noted but pt able to consistently clear L LE when advancing).  No pain or adverse symptoms reported during sessions activities.  Pt would benefit from skilled PT to address noted impairments and functional limitations (see below for any additional details).  Upon hospital discharge, pt would benefit from [place].    Recommendations for follow up therapy are one component of a multi-disciplinary discharge planning process, led by the attending physician.  Recommendations may be updated based on patient status, additional functional criteria and insurance authorization.  Follow Up Recommendations Home health PT    Assistance Recommended at Discharge Intermittent Supervision/Assistance (assist for stairs into home)  Functional Status Assessment Patient has had a recent decline in their functional status and demonstrates the ability to make significant improvements in function in a reasonable and predictable amount  of time.  Equipment Recommendations  Rolling walker (2 wheels) (youth sized)    Recommendations for Other Services OT consult     Precautions / Restrictions Precautions Precautions: Fall Restrictions Weight Bearing Restrictions: No      Mobility  Bed Mobility Overal bed mobility: Needs Assistance Bed Mobility: Supine to Sit;Sit to Supine     Supine to sit: Supervision;HOB elevated Sit to supine: Supervision;HOB elevated   General bed mobility comments: mild increased effort to perform on own    Transfers Overall transfer level: Needs assistance Equipment used: Rolling walker (2 wheels);None Transfers: Sit to/from [employer] to Stand: Min guard Stand pivot transfers: Min guard (stand step turn bed to/from [facility] no AD (CGA for safety))         General transfer comment: vc's for UE placement and walker use; increased effort to stand up to youth sized RW    Ambulation/Gait Ambulation/Gait assistance: Min guard Gait Distance (Feet): 100 Feet Assistive device: Rolling walker (2 wheels) (youth sized)   Gait velocity: decreased   General Gait Details: L foot drop; partial step through gait pattern; steady with walker use  Stairs            Wheelchair Mobility    Modified Rankin (Stroke Patients Only)       Balance Overall balance assessment: Needs assistance Sitting-balance support: No upper extremity supported;Feet supported Sitting balance-Leahy Scale: Normal Sitting balance - Comments: steady sitting reaching outside BOS   Standing balance support: Single extremity supported Standing balance-Leahy Scale: Fair Standing balance comment: pt requiring at least single UE support for static standing balance  Pertinent Vitals/Pain Pain Assessment: No/denies pain O2 sats on room air stable and WFL throughout treatment session. HR 84 bpm at rest but increased up to 117 bpm with activity    Home  Living Family/patient expects to be discharged to:: Private residence Living Arrangements: Children (pt's son) Available Help at Discharge: Family;Available PRN/intermittently Type of Home: House Home Access: Stairs to enter Entrance Stairs-Rails: None (can lean onto R wall if needed) Entrance Stairs-Number of Steps: 2-3   Home Layout: One level Home Equipment: Rollator (4 wheels)      Prior Function Prior Level of Function : Independent/Modified Independent             Mobility Comments: Ambulatory with 4ww (used rollator because she had a seat to sit down on when she got dizzy or shaky with walking; recently limited to walking 1/2 block but hoping with recent medical care she will be able to walk further)       Hand Dominance        Extremity/Trunk Assessment   Upper Extremity Assessment Upper Extremity Assessment: Generalized weakness    Lower Extremity Assessment Lower Extremity Assessment: Generalized weakness;LLE deficits/detail (at least 3/5 AROM B LE hip flexion and knee flexion/extension; at least 3/5 AROM R ankle DF/PF) LLE Deficits / Details: L foot drop (baseline per pt report)    Cervical / Trunk Assessment Cervical / Trunk Assessment: Normal  Communication   Communication: No difficulties  Cognition Arousal/Alertness: Awake/alert Behavior During Therapy: WFL for tasks assessed/performed Overall Cognitive Status: Within Functional Limits for tasks assessed                                          General Comments  Nursing cleared pt for participation in physical therapy.  MD Gollan reports no restrictions with activity/mobility.  Pt agreeable to PT session.     Exercises  Transfer and ambulation with walker   Assessment/Plan    PT Assessment Patient needs continued PT services  PT Problem List Decreased strength;Decreased activity tolerance;Decreased balance;Decreased mobility;Decreased knowledge of use of DME       PT  Treatment Interventions DME instruction;Gait training;Stair training;Functional mobility training;Therapeutic activities;Therapeutic exercise;Balance training;Patient/family education    PT Goals (Current goals can be found in the Care Plan section)  Acute Rehab PT Goals Patient Stated Goal: to go home PT Goal Formulation: With patient Time For Goal Achievement: 03/25/21 Potential to Achieve Goals: Good    Frequency Min 2X/week   Barriers to discharge        Co-evaluation               AM-PAC PT "6 Clicks" Mobility  Outcome Measure Help needed turning from your back to your side while in a flat bed without using bedrails?: None Help needed moving from lying on your back to sitting on the side of a flat bed without using bedrails?: A Little Help needed moving to and from a bed to a chair (including a wheelchair)?: A Little Help needed standing up from a chair using your arms (e.g., wheelchair or bedside chair)?: A Little Help needed to walk in hospital room?: A Little Help needed climbing 3-5 steps with a railing? : A Little 6 Click Score: 19    End of Session Equipment Utilized During Treatment: Gait belt Activity Tolerance: Patient tolerated treatment well Patient left: in bed;with call bell/phone within reach;with bed alarm set Nurse  Communication: Mobility status;Precautions;Other (comment) (NT notified pt had bowel movement) PT Visit Diagnosis: Other abnormalities of gait and mobility (R26.89);Muscle weakness (generalized) (M62.81);History of falling (Z91.81)    Time: 4799-8721 PT Time Calculation (min) (ACUTE ONLY): 36 min   Charges:   PT Evaluation $PT Eval Low Complexity: 1 Low PT Treatments $Therapeutic Activity: 8-22 mins       Leitha Bleak, PT 03/11/21, 10:59 AM

## 2021-03-11 NOTE — TOC Initial Note (Signed)
Transition of Care [facility]) - Initial/Assessment Note    Patient Details  Name: Eugena Rhue MRN: [id number] Date of Birth: March 29, 1951  [facility] CM/SW Contact:    Kerin Salen, RN Phone Number: 03/11/2021, 11:13 AM  Clinical Narrative: TOCRN spoke with patient who lives with her [place]a [place], since July 2022, originally from [place]. Uses rolling walker for ambulations voices independence with ADL's however could use Shower chair. Feels safe with son who also cook and shop when needed. Currently still using the [place] for medications, had appointment with local PCP to establish care when referred to hospital. Now with kidney failure, brother and other family members were on dialysis. TOC to continue to track.                       Patient Goals and CMS Choice        Expected Discharge Plan and Services                                                Prior Living Arrangements/Services                       Activities of Daily Living Home Assistive Devices/Equipment: Gilford Rile (specify type) ADL Screening (condition at time of admission) Patient's cognitive ability adequate to safely complete daily activities?: Yes Is the patient deaf or have difficulty hearing?: No Does the patient have difficulty seeing, even when wearing glasses/contacts?: No Does the patient have difficulty concentrating, remembering, or making decisions?: No Patient able to express need for assistance with ADLs?: Yes Does the patient have difficulty dressing or bathing?: No Independently performs ADLs?: Yes (appropriate for developmental age) Does the patient have difficulty walking or climbing stairs?: Yes Weakness of Legs: Both Weakness of Arms/Hands: Both  Permission Sought/Granted                  Emotional Assessment              Admission diagnosis:  NSTEMI (non-ST elevated myocardial infarction) ([place]) [I21.4] Hypertensive  emergency [I16.1] Nonintractable headache, unspecified chronicity pattern, unspecified headache type [R51.9] Nausea and vomiting, unspecified vomiting type [R11.2] STEMI (ST elevation myocardial infarction) ([place]) [I21.3] Patient Active Problem List   Diagnosis Date Noted   Non-ST elevation (NSTEMI) myocardial infarction ([place]) 03/09/2021   DM type 2 (diabetes mellitus, type 2) ([place]) 03/09/2021   CAD (coronary artery disease) 03/09/2021   Atrial fibrillation ([place]) 03/09/2021   Chronic anticoagulation 03/09/2021   Obesity, Class III, BMI 40-49.9 (morbid obesity) ([place]) 03/09/2021   HTN (hypertension) 03/09/2021   PCP:  Bo Merino, FNP Pharmacy:   [facility] [address], [place] [address] [place] [address] Phone: [phone] Fax: [phone]     Social Determinants of Health (SDOH) Interventions    Readmission Risk Interventions No flowsheet data found.

## 2021-03-11 NOTE — Progress Notes (Signed)
[facility]  ROUNDING NOTE   Subjective:   [name] sitting up in bed States she feels better this morning Was able to ambulate with assistance to bathroom Denies pain and discomfort  Creatinine slightly worse Recorded urine output of 1L Heparin drip  Objective:  Vital signs in last 24 hours:  Temp:  [98 F (36.7 C)-98.4 F (36.9 C)] 98.4 F (36.9 C) (11/04 0400) Resp:  [13-23] 15 (11/04 0700) BP: (75-146)/(57-78) 140/67 (11/04 0700) SpO2:  [95 %-100 %] 99 % (11/04 0700)  Weight change:  Filed Weights   03/08/21 1749 03/09/21 0554  Weight: 105.2 kg 87.7 kg    Intake/Output: I/O last 3 completed shifts: In: 824.3 [P.O.:480; I.V.:344.3] Out: 1600 [Urine:1600]   Intake/Output this shift:  No intake/output data recorded.  Physical Exam: General: NAD, sitting up in bed  Head: Normocephalic, atraumatic. Moist oral mucosal membranes  Eyes: Anicteric  Lungs:  Clear to auscultation, normal effort  Heart: Regular rate and rhythm  Abdomen:  Soft, nontender  Extremities:  no peripheral edema.  Neurologic: Nonfocal, moving all four extremities  Skin: No lesions       Basic Metabolic Panel: Recent Labs  Lab 03/08/21 1749 03/08/21 2057 03/09/21 0629 03/10/21 0458 03/11/21 0427  NA 136  --  136 137 135  K 4.8  --  4.7 5.0 4.7  CL 109  --  109 109 109  CO2 16*  --  19* 19* 17*  GLUCOSE 154*  --  126* 83 84  BUN 36*  --  33* 41* 44*  CREATININE 2.59*  --  2.35* 3.23* 3.78*  CALCIUM 9.4  --  9.0 8.5* 8.4*  MG  --  1.8  --   --  1.8    Liver Function Tests: Recent Labs  Lab 03/08/21 2057  AST 29  ALT 11  ALKPHOS 139*  BILITOT 0.9  PROT 9.0*  ALBUMIN 3.3*   No results for input(s): LIPASE, AMYLASE in the last 168 hours. No results for input(s): AMMONIA in the last 168 hours.  CBC: Recent Labs  Lab 03/08/21 1749 03/09/21 0629 03/10/21 0458 03/11/21 0009 03/11/21 0427  WBC 4.7 5.9 6.5 6.5 6.1  NEUTROABS  --   --   --   --  4.0  HGB  11.7* 11.8* 9.8* 9.8* 9.4*  HCT 34.2* 33.9* 29.9* 30.2* 28.4*  MCV 86.1 85.4 85.7 85.6 84.5  PLT 166 169 166 153 147*    Cardiac Enzymes: No results for input(s): CKTOTAL, CKMB, CKMBINDEX, TROPONINI in the last 168 hours.  BNP: Invalid input(s): POCBNP  CBG: Recent Labs  Lab 03/10/21 0717 03/10/21 1117 03/10/21 1530 03/10/21 2118 03/11/21 0727  GLUCAP 83 159* 109* 99 86    Microbiology: Results for orders placed or performed during the hospital encounter of 03/08/21  Resp Panel by RT-PCR (Flu A&B, Covid) Nasopharyngeal Swab     Status: None   Collection Time: 03/08/21  8:57 PM   Specimen: Nasopharyngeal Swab; Nasopharyngeal(NP) swabs in vial transport medium  Result Value Ref Range Status   SARS Coronavirus 2 by RT PCR NEGATIVE NEGATIVE Final    Comment: (NOTE) SARS-CoV-2 target nucleic acids are NOT DETECTED.  The SARS-CoV-2 RNA is generally detectable in upper respiratory specimens during the acute phase of infection. The lowest concentration of SARS-CoV-2 viral copies this assay can detect is 138 copies/mL. A negative result does not preclude SARS-Cov-2 infection and should not be used as the sole basis for treatment or other patient management decisions. A  negative result may occur with  improper specimen collection/handling, submission of specimen other than nasopharyngeal swab, presence of viral mutation(s) within the areas targeted by this assay, and inadequate number of viral copies(<138 copies/mL). A negative result must be combined with clinical observations, patient history, and epidemiological information. The expected result is Negative.  Fact Sheet for Patients:  EntrepreneurPulse.com.au  Fact Sheet for Healthcare Providers:  IncredibleEmployment.be  This test is no t yet approved or cleared by the [place] FDA and  has been authorized for detection and/or diagnosis of SARS-CoV-2 by FDA under an Emergency Use  Authorization (EUA). This EUA will remain  in effect (meaning this test can be used) for the duration of the COVID-19 declaration under Section 564(b)(1) of the Act, 21 U.S.C.section 360bbb-3(b)(1), unless the authorization is terminated  or revoked sooner.       Influenza A by PCR NEGATIVE NEGATIVE Final   Influenza B by PCR NEGATIVE NEGATIVE Final    Comment: (NOTE) The Xpert Xpress SARS-CoV-2/FLU/RSV plus assay is intended as an aid in the diagnosis of influenza from Nasopharyngeal swab specimens and should not be used as a sole basis for treatment. Nasal washings and aspirates are unacceptable for Xpert Xpress SARS-CoV-2/FLU/RSV testing.  Fact Sheet for Patients: EntrepreneurPulse.com.au  Fact Sheet for Healthcare Providers: IncredibleEmployment.be  This test is not yet approved or cleared by the [place] FDA and has been authorized for detection and/or diagnosis of SARS-CoV-2 by FDA under an Emergency Use Authorization (EUA). This EUA will remain in effect (meaning this test can be used) for the duration of the COVID-19 declaration under Section 564(b)(1) of the Act, 21 U.S.C. section 360bbb-3(b)(1), unless the authorization is terminated or revoked.  Performed at [facility], [address] [place], [place] [address]   MRSA Next Gen by PCR, Nasal     Status: None   Collection Time: 03/09/21  5:50 AM   Specimen: Nasal Mucosa; Nasal Swab  Result Value Ref Range Status   MRSA by PCR Next Gen NOT DETECTED NOT DETECTED Final    Comment: (NOTE) The GeneXpert MRSA Assay (FDA approved for NASAL specimens only), is one component of a comprehensive MRSA colonization surveillance program. It is not intended to diagnose MRSA infection nor to guide or monitor treatment for MRSA infections. Test performance is not FDA approved in patients less than 44 years old. Performed at [facility], [address]  [place], [place] [address]     Coagulation Studies: Recent Labs    03/09/21 1606  LABPROT 14.8  INR 1.2    Urinalysis: No results for input(s): COLORURINE, LABSPEC, PHURINE, GLUCOSEU, HGBUR, BILIRUBINUR, KETONESUR, PROTEINUR, UROBILINOGEN, NITRITE, LEUKOCYTESUR in the last 72 hours.  Invalid input(s): APPERANCEUR    Imaging: ECHOCARDIOGRAM COMPLETE  Result Date: 03/09/2021    ECHOCARDIOGRAM REPORT   Patient Name:   Susan Mclean Date of Exam: 03/09/2021 Medical Rec #:  [id number]    Height:       61.0 in Accession #:    [id number]   Weight:       193.3 lb Date of Birth:  03-Feb-1951    BSA:          1.862 m Patient Age:    44 years     BP:           112/71 mmHg Patient Gender: F            HR:           65 bpm. Exam Location:  [facility] Procedure: 2D Echo, Color Doppler, Cardiac Doppler and Intracardiac            Opacification Agent Indications:     I21.9 Acute myocardial infarction, unspecified  History:         Patient has no prior history of Echocardiogram examinations.                  CHF; Risk Factors:Hypertension.  Sonographer:     Charmayne Sheer Referring Phys:  [phone] CHRISTOPHER END Diagnosing Phys: Kate Sable MD  Sonographer Comments: Suboptimal apical window and suboptimal subcostal window. Image acquisition challenging due to patient body habitus. IMPRESSIONS  1. Left ventricular ejection fraction, by estimation, is 55%. The left ventricle has normal function. The left ventricle demonstrates regional wall motion abnormalities (see scoring diagram/findings for description). There is mild left ventricular hypertrophy. Left ventricular diastolic parameters are consistent with Grade II diastolic dysfunction (pseudonormalization). There is moderate hypokinesis of the left ventricular, basal-mid inferolateral wall.  2. Right ventricular systolic function is normal. The right ventricular size is normal.  3. The mitral valve is normal in structure. Mild mitral valve regurgitation.  4. The aortic valve  was not well visualized. Aortic valve regurgitation is not visualized.  5. The inferior vena cava is normal in size with greater than 50% respiratory variability, suggesting right atrial pressure of 3 mmHg. FINDINGS  Left Ventricle: Left ventricular ejection fraction, by estimation, is 55%. The left ventricle has normal function. The left ventricle demonstrates regional wall motion abnormalities. Moderate hypokinesis of the left ventricular, basal-mid inferolateral wall. Definity contrast agent was given IV to delineate the left ventricular endocardial borders. The left ventricular internal cavity size was normal in size. There is mild left ventricular hypertrophy. Left ventricular diastolic parameters are consistent with Grade II diastolic dysfunction (pseudonormalization). Right Ventricle: The right ventricular size is normal. No increase in right ventricular wall thickness. Right ventricular systolic function is normal. Left Atrium: Left atrial size was normal in size. Right Atrium: Right atrial size was normal in size. Pericardium: There is no evidence of pericardial effusion. Mitral Valve: The mitral valve is normal in structure. Mild mitral valve regurgitation. MV peak gradient, 1.3 mmHg. The mean mitral valve gradient is 1.0 mmHg. Tricuspid Valve: The tricuspid valve is normal in structure. Tricuspid valve regurgitation is mild. Aortic Valve: The aortic valve was not well visualized. Aortic valve regurgitation is not visualized. Aortic valve mean gradient measures 2.0 mmHg. Aortic valve peak gradient measures 3.8 mmHg. Aortic valve area, by VTI measures 2.48 cm. Pulmonic Valve: The pulmonic valve was not well visualized. Pulmonic valve regurgitation is not visualized. Aorta: The aortic root is normal in size and structure. Venous: The inferior vena cava is normal in size with greater than 50% respiratory variability, suggesting right atrial pressure of 3 mmHg. IAS/Shunts: No atrial level shunt detected by  color flow Doppler.  LEFT VENTRICLE PLAX 2D LVIDd:         3.70 cm   Diastology LVIDs:         2.40 cm   LV e' medial:    3.37 cm/s LV PW:         1.30 cm   LV E/e' medial:  17.8 LV IVS:        1.00 cm   LV e' lateral:   5.00 cm/s LVOT diam:     2.00 cm   LV E/e' lateral: 12.0 LV SV:         47 LV SV Index:  25 LVOT Area:     3.14 cm  LEFT ATRIUM             Index LA diam:        4.20 cm 2.26 cm/m LA Vol (A2C):   33.3 ml 17.89 ml/m LA Vol (A4C):   31.1 ml 16.70 ml/m LA Biplane Vol: 33.6 ml 18.05 ml/m  AORTIC VALVE                    PULMONIC VALVE AV Area (Vmax):    2.45 cm     PV Vmax:       0.82 m/s AV Area (Vmean):   2.38 cm     PV Vmean:      61.000 cm/s AV Area (VTI):     2.48 cm     PV VTI:        0.195 m AV Vmax:           98.10 cm/s   PV Peak grad:  2.7 mmHg AV Vmean:          71.800 cm/s  PV Mean grad:  2.0 mmHg AV VTI:            0.190 m AV Peak Grad:      3.8 mmHg AV Mean Grad:      2.0 mmHg LVOT Vmax:         76.40 cm/s LVOT Vmean:        54.300 cm/s LVOT VTI:          0.150 m LVOT/AV VTI ratio: 0.79  AORTA Ao Root diam: 2.80 cm MITRAL VALVE               TRICUSPID VALVE MV Area (PHT): 3.28 cm    TR Peak grad:   17.5 mmHg MV Area VTI:   2.89 cm    TR Vmax:        209.00 cm/s MV Peak grad:  1.3 mmHg MV Mean grad:  1.0 mmHg    SHUNTS MV Vmax:       0.56 m/s    Systemic VTI:  0.15 m MV Vmean:      35.6 cm/s   Systemic Diam: 2.00 cm MV Decel Time: 231 msec MV E velocity: 60.00 cm/s MV A velocity: 50.10 cm/s MV E/A ratio:  1.20 Kate Sable MD Electronically signed by Kate Sable MD Signature Date/Time: 03/09/2021/6:15:50 PM    Final      Medications:    sodium chloride     heparin 800 Units/hr (03/11/21 0653)    aspirin  81 mg Oral Daily   atorvastatin  80 mg Oral Daily   Chlorhexidine Gluconate Cloth  6 each Topical Daily   clopidogrel  75 mg Oral Q breakfast   insulin aspart  0-15 Units Subcutaneous TID WC   insulin aspart  0-5 Units Subcutaneous QHS   pantoprazole  20 mg  Oral Daily   sodium chloride flush  3 mL Intravenous Q12H   sodium chloride flush  3 mL Intravenous Q12H   sodium zirconium cyclosilicate  10 g Oral Daily   sodium chloride, acetaminophen **OR** acetaminophen, acetaminophen, nitroGLYCERIN, ondansetron (ZOFRAN) IV, ondansetron **OR** [DISCONTINUED] ondansetron (ZOFRAN) IV, sodium chloride flush  Assessment/ Plan:  Ms. Susan Mclean is a 70 y.o.  female with medical problems of stroke, coronary disease with previous history of MI 2021, atrial fibrillation anticoagulation with Eliquis, CHF, hypertension, diabetes, CKD  was admitted on 03/08/2021 for : NSTEMI (non-ST elevated myocardial infarction) [facility]) [  I21.4] Hypertensive emergency [I16.1] Nonintractable headache, unspecified chronicity pattern, unspecified headache type [R51.9] Nausea and vomiting, unspecified vomiting type [R11.2] STEMI (ST elevation myocardial infarction) ([place]) [I21.3]   #Acute kidney injury secondary to contrast nephropathy #Chronic kidney disease stage IV.  Baseline creatinine 2.6/GFR 19 #Diabetes type 2 with CKD.  Hemoglobin A1c 6.2% from March 09, 2021  Imaging: CT angiogram chest: March 08, 2021: Left renal cysts Urinalysis: Not available 2D echo: March 09, 2021: LVEF 51%, grade 2 diastolic dysfunction, moderate hypokinesis of left ventricular, basal mid inferior lateral wall   AKI seems to be secondary to ATN from multiple recent contrast exposures.   Awaiting urinalysis Avoid hypotension Electrolytes and volume status are acceptable.  No acute indication for dialysis at present [facility] daily for hyperkalemia Continue monitoring renal function. Increased today, but expected.     PS: Contrast nephropathy: ICD-10-CM N14.11 is a new 2023 ICD-10-CM code that became effective on February 05, 2021.    LOS: 2 [place] 11/4/20229:00 AM

## 2021-03-12 DIAGNOSIS — I10 Essential (primary) hypertension: Secondary | ICD-10-CM

## 2021-03-12 LAB — CBC
HCT: 31.4 % — ABNORMAL LOW (ref 36.0–46.0)
Hemoglobin: 10.5 g/dL — ABNORMAL LOW (ref 12.0–15.0)
MCH: 28.2 pg (ref 26.0–34.0)
MCHC: 33.4 g/dL (ref 30.0–36.0)
MCV: 84.2 fL (ref 80.0–100.0)
Platelets: 173 10*3/uL (ref 150–400)
RBC: 3.73 MIL/uL — ABNORMAL LOW (ref 3.87–5.11)
RDW: 15 % (ref 11.5–15.5)
WBC: 4.8 10*3/uL (ref 4.0–10.5)
nRBC: 0 % (ref 0.0–0.2)

## 2021-03-12 LAB — GLUCOSE, CAPILLARY
Glucose-Capillary: 70 mg/dL (ref 70–99)
Glucose-Capillary: 109 mg/dL — ABNORMAL HIGH (ref 70–99)
Glucose-Capillary: 155 mg/dL — ABNORMAL HIGH (ref 70–99)
Glucose-Capillary: 101 mg/dL — ABNORMAL HIGH (ref 70–99)

## 2021-03-12 LAB — APTT
aPTT: 108 seconds — ABNORMAL HIGH (ref 24–36)
aPTT: 86 seconds — ABNORMAL HIGH (ref 24–36)

## 2021-03-12 LAB — BASIC METABOLIC PANEL
Anion gap: 7 (ref 5–15)
BUN: 44 mg/dL — ABNORMAL HIGH (ref 8–23)
CO2: 20 mmol/L — ABNORMAL LOW (ref 22–32)
Calcium: 8.8 mg/dL — ABNORMAL LOW (ref 8.9–10.3)
Chloride: 110 mmol/L (ref 98–111)
Creatinine, Ser: 3.54 mg/dL — ABNORMAL HIGH (ref 0.44–1.00)
GFR, Estimated: 13 mL/min — ABNORMAL LOW (ref >60)
Glucose, Bld: 81 mg/dL (ref 70–99)
Potassium: 4.5 mmol/L (ref 3.5–5.1)
Sodium: 137 mmol/L (ref 135–145)

## 2021-03-12 LAB — HEPARIN LEVEL (UNFRACTIONATED): Heparin Unfractionated: 0.62 IU/mL (ref 0.30–0.70)

## 2021-03-12 MED ORDER — CARVEDILOL 6.25 MG PO TABS
6.2500 mg | ORAL_TABLET | Freq: Two times a day (BID) | ORAL | Status: DC
  Administered 2021-03-12 – 2021-03-13 (×2): 6.25 mg via ORAL
  Filled 2021-03-12 (×2): qty 1

## 2021-03-12 MED ORDER — APIXABAN 5 MG PO TABS
5.0000 mg | ORAL_TABLET | Freq: Two times a day (BID) | ORAL | Status: DC
  Administered 2021-03-12 – 2021-03-13 (×3): 5 mg via ORAL
  Filled 2021-03-12 (×3): qty 1

## 2021-03-12 MED ORDER — AMIODARONE HCL 200 MG PO TABS
200.0000 mg | ORAL_TABLET | Freq: Every day | ORAL | Status: DC
  Administered 2021-03-12 – 2021-03-13 (×2): 200 mg via ORAL
  Filled 2021-03-12 (×2): qty 1

## 2021-03-12 NOTE — Progress Notes (Signed)
[facility] NAME: Sakinah Rosamond    MR#:  [id number]  DATE OF BIRTH:  [birth year]  SUBJECTIVE:   patient overall had an uneventful day yesterday. Denies any complaints.  No chest pain or shortness of breath REVIEW OF SYSTEMS:   Review of Systems  Constitutional:  Negative for chills, fever and weight loss.  HENT:  Negative for ear discharge, ear pain and nosebleeds.   Eyes:  Negative for blurred vision, pain and discharge.  Respiratory:  Negative for sputum production, shortness of breath, wheezing and stridor.   Cardiovascular:  Negative for chest pain, palpitations, orthopnea and PND.  Gastrointestinal:  Negative for abdominal pain, diarrhea, nausea and vomiting.  Genitourinary:  Negative for frequency and urgency.  Musculoskeletal:  Negative for back pain and joint pain.  Neurological:  Negative for sensory change, speech change, focal weakness and weakness.  Psychiatric/Behavioral:  Negative for depression and hallucinations. The patient is not nervous/anxious.   Tolerating Diet:yes Tolerating PT: HHPT  DRUG ALLERGIES:  No Known Allergies  VITALS:  Blood pressure (!) 187/86, pulse 75, temperature 98.3 F (36.8 C), temperature source Oral, resp. rate 15, height 5\' 1"  (1.549 m), weight 87.7 kg, SpO2 99 %.  PHYSICAL EXAMINATION:   Physical Exam  GENERAL:  70 y.o.-year-old patient lying in the bed with no acute distress. obese LUNGS: Normal breath sounds bilaterally, no wheezing, rales, rhonchi. No use of accessory muscles of respiration.  CARDIOVASCULAR: S1, S2 normal. No murmurs, rubs, or gallops.  ABDOMEN: Soft, nontender, nondistended. Bowel sounds present. No organomegaly or mass.  EXTREMITIES: No cyanosis, clubbing or edema b/l.    NEUROLOGIC: non focal PSYCHIATRIC:  patient is alert and oriented x 3.  SKIN: No obvious rash, lesion, or ulcer.   LABORATORY PANEL:  CBC Recent Labs  Lab 03/12/21 0757  WBC 4.8  HGB  10.5*  HCT 31.4*  PLT 173     Chemistries  Recent Labs  Lab 03/08/21 2057 03/09/21 0629 03/11/21 0427 03/12/21 0757  NA  --    < > 135 137  K  --    < > 4.7 4.5  CL  --    < > 109 110  CO2  --    < > 17* 20*  GLUCOSE  --    < > 84 81  BUN  --    < > 44* 44*  CREATININE  --    < > 3.78* 3.54*  CALCIUM  --    < > 8.4* 8.8*  MG 1.8  --  1.8  --   AST 29  --   --   --   ALT 11  --   --   --   ALKPHOS 139*  --   --   --   BILITOT 0.9  --   --   --    < > = values in this interval not displayed.    Cardiac Enzymes No results for input(s): TROPONINI in the last 168 hours. RADIOLOGY:  No results found. ASSESSMENT AND PLAN:   Reet Scharrer is a 70 y.o. female with medical history significant for  CVA, CAD with history of previous MI in [place], A. fib on Eliquis, CHF, HTN and DM presenting with severe substernal and left-sided chest pain like a heaviness that started around 1400 Pain was severe, constant, and radiating to the back and left arm . She stated since the day prior she was having nausea  and vomiting , up until the onset of the pain.     NSTEMI s/p PCI 03/09/21   CAD with hx MI 2021 w/ stent LAD - Patient with history of CAD and prior MI with stent presenting with NSTEMI, taken emergently to Cath Lab due to intractable pain unrelieved with IV nitroglycerin--now off - s/p DES to OM 3 and LCx and is chest pain-free - [facility] cardiology consult appreciated --11/3-- cont iv heparin gtt for now per Dr Rockey Situ --11/5--d/ced heparin gtt--restarted eliquis by cardiology -- Per Dr End ---recommends aspirin for one week, Plavix 75 mg daily for at least 12 months and resume eliquis at discharge      DM type 2 (diabetes mellitus, type 2) with CKD-IV - Sliding scale insulin coverage --sugars stable -- a1c 6.2%  Acute on CKD-IV suspect contrast induced Baseline creat 2.5 --creat upto 3.25 after cath and CT angio chest--3.78--3.5 --good UOP -- nephrology consultation with  Dr. Candiss Norse appreciated. Avoid nephrotoxins    Atrial fibrillation (HCC) Chronic anticoagulation at home (on eliquis) - patient currently on IV heparin drip for now while monitoring hgb --hgb 11.8--9.8. no bleeding--9.4 --now resumed PO eliquis   obesity, Class III, BMI 40-49.9 (morbid obesity) ([place]) - Complicating factor to overall prognosis and care     HTN (hypertension) - continue carvedilol and imdur   PT recommends home health PT  Procedures: cardiac cath Family communication : son Landry Mellow on the phone 11/4 Consults : [facility] cardiology CODE STATUS: full DVT Prophylaxis : eliquis Level of care: Med-Surg Status is: Inpatient   anticipate discharge 1 to 2 days TOTAL TIME TAKING CARE OF THIS PATIENT: 25 minutes.  >50% time spent on counselling and coordination of care  Note: This dictation was prepared with Dragon dictation along with smaller phrase technology. Any transcriptional errors that result from this process are unintentional.  Fritzi Mandes M.D    [facility]   CC: Primary care physician; No primary care provider on file. Patient ID: Luevenia Mcavoy, female   DOB: Apr 14, 1951, 70 y.o.   MRN: [id number]

## 2021-03-12 NOTE — Progress Notes (Signed)
[facility]  ROUNDING NOTE   Subjective:   Patient seen today in ICU Patient resting comfortably Patient offers no new specific physical complaints    Objective:  Vital signs in last 24 hours:  Temp:  [98.2 F (36.8 C)-98.6 F (37 C)] 98.3 F (36.8 C) (11/05 0400) Resp:  [12-29] 12 (11/05 0700) BP: (90-164)/(53-127) 107/58 (11/05 0700) SpO2:  [99 %-100 %] 99 % (11/05 0400)  Weight change:  Filed Weights   03/08/21 1749 03/09/21 0554  Weight: 105.2 kg 87.7 kg    Intake/Output: I/O last 3 completed shifts: In: 438 [I.V.:438] Out: 875 [Urine:875]   Intake/Output this shift:  No intake/output data recorded.  Physical Exam: General: NAD, sitting up in bed  Head: Normocephalic, atraumatic. Moist oral mucosal membranes  Eyes: Anicteric  Lungs:  Clear to auscultation, normal effort  Heart: Regular rate and rhythm  Abdomen:  Soft, nontender  Extremities:  no peripheral edema.  Neurologic: Nonfocal, moving all four extremities  Skin: No lesions       Basic Metabolic Panel: Recent Labs  Lab 03/08/21 1749 03/08/21 2057 03/09/21 0629 03/10/21 0458 03/11/21 0427 03/12/21 0757  NA 136  --  136 137 135 137  K 4.8  --  4.7 5.0 4.7 4.5  CL 109  --  109 109 109 110  CO2 16*  --  19* 19* 17* 20*  GLUCOSE 154*  --  126* 83 84 81  BUN 36*  --  33* 41* 44* 44*  CREATININE 2.59*  --  2.35* 3.23* 3.78* 3.54*  CALCIUM 9.4  --  9.0 8.5* 8.4* 8.8*  MG  --  1.8  --   --  1.8  --     Liver Function Tests: Recent Labs  Lab 03/08/21 2057  AST 29  ALT 11  ALKPHOS 139*  BILITOT 0.9  PROT 9.0*  ALBUMIN 3.3*   No results for input(s): LIPASE, AMYLASE in the last 168 hours. No results for input(s): AMMONIA in the last 168 hours.  CBC: Recent Labs  Lab 03/09/21 0629 03/10/21 0458 03/11/21 0009 03/11/21 0427 03/12/21 0757  WBC 5.9 6.5 6.5 6.1 4.8  NEUTROABS  --   --   --  4.0  --   HGB 11.8* 9.8* 9.8* 9.4* 10.5*  HCT 33.9* 29.9* 30.2* 28.4* 31.4*   MCV 85.4 85.7 85.6 84.5 84.2  PLT 169 166 153 147* 173    Cardiac Enzymes: No results for input(s): CKTOTAL, CKMB, CKMBINDEX, TROPONINI in the last 168 hours.  BNP: Invalid input(s): POCBNP  CBG: Recent Labs  Lab 03/11/21 0727 03/11/21 1137 03/11/21 1610 03/11/21 2119 03/12/21 0748  GLUCAP 86 112* 102* 93 70    Microbiology: Results for orders placed or performed during the hospital encounter of 03/08/21  Resp Panel by RT-PCR (Flu A&B, Covid) Nasopharyngeal Swab     Status: None   Collection Time: 03/08/21  8:57 PM   Specimen: Nasopharyngeal Swab; Nasopharyngeal(NP) swabs in vial transport medium  Result Value Ref Range Status   SARS Coronavirus 2 by RT PCR NEGATIVE NEGATIVE Final    Comment: (NOTE) SARS-CoV-2 target nucleic acids are NOT DETECTED.  The SARS-CoV-2 RNA is generally detectable in upper respiratory specimens during the acute phase of infection. The lowest concentration of SARS-CoV-2 viral copies this assay can detect is 138 copies/mL. A negative result does not preclude SARS-Cov-2 infection and should not be used as the sole basis for treatment or other patient management decisions. A negative result may occur with  improper specimen collection/handling, submission of specimen other than nasopharyngeal swab, presence of viral mutation(s) within the areas targeted by this assay, and inadequate number of viral copies(<138 copies/mL). A negative result must be combined with clinical observations, patient history, and epidemiological information. The expected result is Negative.  Fact Sheet for Patients:  EntrepreneurPulse.com.au  Fact Sheet for Healthcare Providers:  IncredibleEmployment.be  This test is no t yet approved or cleared by the [place] FDA and  has been authorized for detection and/or diagnosis of SARS-CoV-2 by FDA under an Emergency Use Authorization (EUA). This EUA will remain  in effect (meaning  this test can be used) for the duration of the COVID-19 declaration under Section 564(b)(1) of the Act, 21 U.S.C.section 360bbb-3(b)(1), unless the authorization is terminated  or revoked sooner.       Influenza A by PCR NEGATIVE NEGATIVE Final   Influenza B by PCR NEGATIVE NEGATIVE Final    Comment: (NOTE) The Xpert Xpress SARS-CoV-2/FLU/RSV plus assay is intended as an aid in the diagnosis of influenza from Nasopharyngeal swab specimens and should not be used as a sole basis for treatment. Nasal washings and aspirates are unacceptable for Xpert Xpress SARS-CoV-2/FLU/RSV testing.  Fact Sheet for Patients: EntrepreneurPulse.com.au  Fact Sheet for Healthcare Providers: IncredibleEmployment.be  This test is not yet approved or cleared by the [place] FDA and has been authorized for detection and/or diagnosis of SARS-CoV-2 by FDA under an Emergency Use Authorization (EUA). This EUA will remain in effect (meaning this test can be used) for the duration of the COVID-19 declaration under Section 564(b)(1) of the Act, 21 U.S.C. section 360bbb-3(b)(1), unless the authorization is terminated or revoked.  Performed at [facility], [address] [place], [place] [address]   MRSA Next Gen by PCR, Nasal     Status: None   Collection Time: 03/09/21  5:50 AM   Specimen: Nasal Mucosa; Nasal Swab  Result Value Ref Range Status   MRSA by PCR Next Gen NOT DETECTED NOT DETECTED Final    Comment: (NOTE) The GeneXpert MRSA Assay (FDA approved for NASAL specimens only), is one component of a comprehensive MRSA colonization surveillance program. It is not intended to diagnose MRSA infection nor to guide or monitor treatment for MRSA infections. Test performance is not FDA approved in patients less than 69 years old. Performed at [facility], [address] [place],  [address]     Coagulation Studies: Recent Labs     03/09/21 1606  LABPROT 14.8  INR 1.2    Urinalysis: No results for input(s): COLORURINE, LABSPEC, PHURINE, GLUCOSEU, HGBUR, BILIRUBINUR, KETONESUR, PROTEINUR, UROBILINOGEN, NITRITE, LEUKOCYTESUR in the last 72 hours.  Invalid input(s): APPERANCEUR    Imaging: No results found.   Medications:    sodium chloride     heparin 950 Units/hr (03/12/21 0600)    aspirin  81 mg Oral Daily   atorvastatin  80 mg Oral Daily   Chlorhexidine Gluconate Cloth  6 each Topical Daily   clopidogrel  75 mg Oral Q breakfast   insulin aspart  0-15 Units Subcutaneous TID WC   insulin aspart  0-5 Units Subcutaneous QHS   pantoprazole  20 mg Oral Daily   sodium chloride flush  3 mL Intravenous Q12H   sodium chloride flush  3 mL Intravenous Q12H   sodium zirconium cyclosilicate  10 g Oral Daily   sodium chloride, acetaminophen **OR** acetaminophen, acetaminophen, nitroGLYCERIN, ondansetron (ZOFRAN) IV, ondansetron **OR** [DISCONTINUED] ondansetron (ZOFRAN) IV, sodium chloride flush  Assessment/  Plan:  Ms. Susan Mclean is a 70 y.o.  female with medical problems of stroke, coronary disease with previous history of MI 2021, atrial fibrillation anticoagulation with Eliquis, CHF, hypertension, diabetes, CKD  was admitted on 03/08/2021 for : NSTEMI (non-ST elevated myocardial infarction) ([place]) [I21.4] Hypertensive emergency [I16.1] Nonintractable headache, unspecified chronicity pattern, unspecified headache type [R51.9] Nausea and vomiting, unspecified vomiting type [R11.2] STEMI (ST elevation myocardial infarction) ([place]) [I21.3]      1)Renal    AKI Patient has AKI secondary to contrast nephropathy Patient did receive IV contrast on November 1 when patient underwent CT angiography for chest Patient thereafter had contrast exposure again on November 2 when patient underwent coronary angiography Patient had AKI on CKD Patient has baseline CKD stage IV Patient AKI is minimally better as patient  creatinine has come down from 3.8 to now 3.6  FYI: Contrast nephropathy: ICD-10-CM N14.11 is a new 2023 ICD-10-CM code that became effective on February 05, 2021.   2)HTN    Blood pressure is stable    3)Anemia of chronic disease  CBC Latest Ref Rng & Units 03/12/2021 03/11/2021 03/11/2021  WBC 4.0 - 10.5 K/uL 4.8 6.1 6.5  Hemoglobin 12.0 - 15.0 g/dL 10.5(L) 9.4(L) 9.8(L)  Hematocrit 36.0 - 46.0 % 31.4(L) 28.4(L) 30.2(L)  Platelets 150 - 400 K/uL 173 147(L) 153       HGb at goal (9--11)   4) NSTEMI Patient is s/p PCI Patient is being closely followed by currently and the primary team    5) Electrolytes   BMP Latest Ref Rng & Units 03/12/2021 03/11/2021 03/10/2021  Glucose 70 - 99 mg/dL 81 84 83  BUN 8 - 23 mg/dL 44(H) 44(H) 41(H)  Creatinine 0.44 - 1.00 mg/dL 3.54(H) 3.78(H) 3.23(H)  Sodium 135 - 145 mmol/L 137 135 137  Potassium 3.5 - 5.1 mmol/L 4.5 4.7 5.0  Chloride 98 - 111 mmol/L 110 109 109  CO2 22 - 32 mmol/L 20(L) 17(L) 19(L)  Calcium 8.9 - 10.3 mg/dL 8.8(L) 8.4(L) 8.5(L)     Sodium Normonatremic   Potassium Normokalemic    7)Acid base   Co2 is not at goal But better than before We will hold off on starting p.o. bicarb for now  Plan    We will continue current treatment      LOS: 3 Susan Mclean s [facility] 11/5/20229:54 AM

## 2021-03-12 NOTE — Progress Notes (Signed)
ANTICOAGULATION CONSULT NOTE  Pharmacy Consult for heparin infusion Indication: AFib  Patient Measurements: Height: 5\' 1"  (154.9 cm) Weight: 87.7 kg (193 lb 5.5 oz) IBW/kg (Calculated) : 47.8 Heparin Dosing Weight: 73.4 kg  Labs: Recent Labs    03/09/21 1328 03/09/21 1606 03/09/21 2005 03/10/21 0458 03/10/21 1521 03/11/21 0009 03/11/21 0427 03/11/21 1450 03/11/21 2349 03/12/21 0757  HGB  --   --    < > 9.8*  --  9.8* 9.4*  --   --  10.5*  HCT  --   --    < > 29.9*  --  30.2* 28.4*  --   --  31.4*  PLT  --   --    < > 166  --  153 147*  --   --  173  APTT  --   --    < > 157*   < > 99* 130* 63* 86* 108*  LABPROT  --  14.8  --   --   --   --   --   --   --   --   INR  --  1.2  --   --   --   --   --   --   --   --   HEPARINUNFRC  --   --    < > 1.09*   < > 0.94* 0.84* 0.52  --  0.62  CREATININE  --   --   --  3.23*  --   --  3.78*  --   --  3.54*  TROPONINIHS >24,000*  --   --   --   --   --   --   --   --   --    < > = values in this interval not displayed.     Estimated Creatinine Clearance: 14.9 mL/min (A) (by C-G formula based on SCr of 3.54 mg/dL (H)).   Medical History: Past Medical History:  Diagnosis Date   Atrial fibrillation (HCC)    CHF (congestive heart failure) (HCC)    HTN (hypertension)    Hypertension    MI (myocardial infarction) ([place])     Medications:  PTA med:  Eliquis  Assessment: Pt is 70 yo old female w/ hx of HTN and PE (~ 2 yrs ago) on Eliquis, post L heart cath & coronary angiography after presenting to ED w/ CP and diagnosed with NSTEMI.  Goal of Therapy:  Heparin level 0.3-0.7 units/ml Monitor platelets by anticoagulation protocol: Yes Goal aPTT 66-102  1103 0458 HL 1.09, aPTT 157, supratherapeutic 1103 1521 HL 0.74, aPTT 90, therapeutic 1104 0009 HL 0.94, aPTT 99 1104 0427 HL 0.84, aPTT 130, supratherapeutic 1104 1450 aPTT 63, subtherapeutic 1104 2349 aPTT 86, therapeutic 1105 0757 HL 0.62, aPTT 108   Plan:  aPTT  supratherapeutic. HL therapeutic. Not quite correlating yet. Will decrease heparin infusion rate slightly to 900 units/hr Recheck aPTT and HL in 8 hours CBC daily while on heparin  Benita Gutter  03/12/2021 9:44 AM

## 2021-03-12 NOTE — Progress Notes (Signed)
Progress Note  Patient Name: Susan Mclean Date of Encounter: 03/12/2021  [place] Cardiologist: Dr. Saunders Revel  Subjective   Denies chest pain or shortness of breath.  Heart elevated heart rates when she tried ambulating earlier.  Blood pressure has been in the 846N systolic.  Inpatient Medications    Scheduled Meds:  amiodarone  200 mg Oral Daily   apixaban  5 mg Oral BID   aspirin  81 mg Oral Daily   atorvastatin  80 mg Oral Daily   carvedilol  6.25 mg Oral BID WC   Chlorhexidine Gluconate Cloth  6 each Topical Daily   clopidogrel  75 mg Oral Q breakfast   insulin aspart  0-15 Units Subcutaneous TID WC   insulin aspart  0-5 Units Subcutaneous QHS   pantoprazole  20 mg Oral Daily   sodium chloride flush  3 mL Intravenous Q12H   sodium chloride flush  3 mL Intravenous Q12H   sodium zirconium cyclosilicate  10 g Oral Daily   Continuous Infusions:  sodium chloride     PRN Meds: sodium chloride, acetaminophen **OR** acetaminophen, acetaminophen, nitroGLYCERIN, ondansetron (ZOFRAN) IV, ondansetron **OR** [DISCONTINUED] ondansetron (ZOFRAN) IV, sodium chloride flush   Vital Signs    Vitals:   03/12/21 0900 03/12/21 1000 03/12/21 1100 03/12/21 1200  BP: 128/63 (!) 198/80 138/86 (!) 187/86  Pulse:      Resp: 17 (!) 25 15 15   Temp:      TempSrc:      SpO2:      Weight:      Height:        Intake/Output Summary (Last 24 hours) at 03/12/2021 1429 Last data filed at 03/12/2021 0958 Gross per 24 hour  Intake 225.33 ml  Output 650 ml  Net -424.67 ml   Last 3 Weights 03/09/2021 03/08/2021  Weight (lbs) 193 lb 5.5 oz 232 lb  Weight (kg) 87.7 kg 105.235 kg      Telemetry    Sinus rhythm heart rate 85- Personally Reviewed  ECG     - Personally Reviewed  Physical Exam   GEN: No acute distress.   Neck: No JVD Cardiac: RRR, no murmurs, rubs, or gallops.  Respiratory: Diminished breath sounds at bases GI: Soft, nontender, non-distended  MS: No edema; No  deformity. Neuro:  Nonfocal  Psych: Normal affect   Labs    High Sensitivity Troponin:   Recent Labs  Lab 03/08/21 1749 03/08/21 2057 03/09/21 0629 03/09/21 1328  TROPONINIHS 171* 1,735* >24,000* >24,000*     Chemistry Recent Labs  Lab 03/08/21 2057 03/09/21 0629 03/10/21 0458 03/11/21 0427 03/12/21 0757  NA  --    < > 137 135 137  K  --    < > 5.0 4.7 4.5  CL  --    < > 109 109 110  CO2  --    < > 19* 17* 20*  GLUCOSE  --    < > 83 84 81  BUN  --    < > 41* 44* 44*  CREATININE  --    < > 3.23* 3.78* 3.54*  CALCIUM  --    < > 8.5* 8.4* 8.8*  MG 1.8  --   --  1.8  --   PROT 9.0*  --   --   --   --   ALBUMIN 3.3*  --   --   --   --   AST 29  --   --   --   --  ALT 11  --   --   --   --   ALKPHOS 139*  --   --   --   --   BILITOT 0.9  --   --   --   --   GFRNONAA  --    < > 15* 12* 13*  ANIONGAP  --    < > 9 9 7    < > = values in this interval not displayed.    Lipids  Recent Labs  Lab 03/10/21 0458  CHOL 105  TRIG 45  HDL 36*  LDLCALC 60  CHOLHDL 2.9    Hematology Recent Labs  Lab 03/11/21 0009 03/11/21 0427 03/12/21 0757  WBC 6.5 6.1 4.8  RBC 3.53* 3.36* 3.73*  HGB 9.8* 9.4* 10.5*  HCT 30.2* 28.4* 31.4*  MCV 85.6 84.5 84.2  MCH 27.8 28.0 28.2  MCHC 32.5 33.1 33.4  RDW 15.2 15.0 15.0  PLT 153 147* 173   Thyroid No results for input(s): TSH, FREET4 in the last 168 hours.  BNP Recent Labs  Lab 03/08/21 1749  BNP 215.2*    DDimer No results for input(s): DDIMER in the last 168 hours.   Radiology    No results found.  Cardiac Studies   Echo 03/2021 1. Left ventricular ejection fraction, by estimation, is 55%. The left  ventricle has normal function. The left ventricle demonstrates regional  wall motion abnormalities (see scoring diagram/findings for description).  There is mild left ventricular  hypertrophy. Left ventricular diastolic parameters are consistent with  Grade II diastolic dysfunction (pseudonormalization). There is  moderate  hypokinesis of the left ventricular, basal-mid inferolateral wall.   2. Right ventricular systolic function is normal. The right ventricular  size is normal.   3. The mitral valve is normal in structure. Mild mitral valve  regurgitation.   4. The aortic valve was not well visualized. Aortic valve regurgitation  is not visualized.   5. The inferior vena cava is normal in size with greater than 50%  respiratory variability, suggesting right atrial pressure of 3 mmHg.  [facility]hc 03/09/2021 Conclusions: Multivessel coronary artery disease, as detailed below, including 60% mid LAD stenosis just distal to previously placed stent, multifocal codominant LCx disease of up to 80% in the distal vessel as well as thrombotic occlusion of large OM3 branch (culprit for patient's NSTEMI), and 60% proximal/mid RCA disease as well as 90% stenosis involving acute marginal branch. Widely patent mid LAD stent. Normal left ventricular filling pressure. Successful PCI to OM 3 using Onyx Frontier 2.5 x 22 mm drug-eluting stent with 0% residual stenosis and TIMI-3 flow. Successful PCI to distal LCx using Onyx Frontier 2.75 x 12 mm drug-eluting stent with 0% residual stenosis and TIMI-3 flow.  Patient Profile     70 y.o. female with history of paroxysmal atrial fibrillation, CAD presenting with chest pain, diagnosed with NSTEMI, s/p PCI to LCx and OM 3.  Assessment & Plan    NSTEMI s/p DES to Lcx, OM3 -Aspirin, Plavix, Eliquis x1 week then Plavix and Eliquis only after. -Denies chest pain -Lipitor -Echo EF 55%, inferolateral wall hypokinesis  2.  Paroxysmal atrial fibrillation -Sinus rhythm -Restart PTA amiodarone, Eliquis.  Stop heparin.  3.  Hypertension -Tachycardia with minimal ambulation -Start Coreg 6.25 mg twice daily  4.  Deconditioning -PT OT -Okay to transfer to telemetry unit  Total encounter time 35 minutes  Greater than 50% was spent in counseling and coordination of care with the  patient  Signed, Kate Sable, MD  03/12/2021, 2:29 PM

## 2021-03-12 NOTE — Progress Notes (Signed)
ANTICOAGULATION CONSULT NOTE  Pharmacy Consult for heparin infusion Indication: AFib  Patient Measurements: Height: 5\' 1"  (154.9 cm) Weight: 87.7 kg (193 lb 5.5 oz) IBW/kg (Calculated) : 47.8 Heparin Dosing Weight: 73.4 kg  Vital Signs: Temp: 98.2 F (36.8 C) (11/05 0000) Temp Source: Oral (11/05 0000) BP: 150/109 (11/05 0100)  Labs: Recent Labs    03/09/21 0629 03/09/21 1328 03/09/21 1606 03/09/21 2005 03/10/21 0458 03/10/21 1521 03/11/21 0009 03/11/21 0427 03/11/21 1450 03/11/21 2349  HGB 11.8*  --   --   --  9.8*  --  9.8* 9.4*  --   --   HCT 33.9*  --   --   --  29.9*  --  30.2* 28.4*  --   --   PLT 169  --   --   --  166  --  153 147*  --   --   APTT  --   --   --    < > 157*   < > 99* 130* 63* 86*  LABPROT  --   --  14.8  --   --   --   --   --   --   --   INR  --   --  1.2  --   --   --   --   --   --   --   HEPARINUNFRC  --   --   --    < > 1.09*   < > 0.94* 0.84* 0.52  --   CREATININE 2.35*  --   --   --  3.23*  --   --  3.78*  --   --   TROPONINIHS >24,000* >24,000*  --   --   --   --   --   --   --   --    < > = values in this interval not displayed.     Estimated Creatinine Clearance: 13.9 mL/min (A) (by C-G formula based on SCr of 3.78 mg/dL (H)).   Medical History: Past Medical History:  Diagnosis Date   Atrial fibrillation (HCC)    CHF (congestive heart failure) (HCC)    HTN (hypertension)    Hypertension    MI (myocardial infarction) ([place])     Medications:  PTA med:  Eliquis  Assessment: Pt is 70 yo old female w/ hx of HTN and PE (~ 2 yrs ago) on Eliquis, post L heart cath & coronary angiography after presenting to ED w/ CP and diagnosed with NSTEMI.  Goal of Therapy:  Heparin level 0.3-0.7 units/ml Monitor platelets by anticoagulation protocol: Yes Goal aPTT 66-102  1103 0458 HL 1.09, aPTT 157, supratherapeutic 1103 1521 HL 0.74, aPTT 90, therapeutic 1104 0009 HL 0.94, aPTT 99 1104 0427 HL 0.84, aPTT 130, supratherapeutic 1104  1450 aPTT 63, subtherapeutic 1104 2349 aPTT 86, therapeutic   Plan:  aPTT therapeutic. Continue heparin infusion at 950 units/hr Recheck aPTT with AM labs to confirm Recheck HL with AM labs and switch to monitoring HL once correlating with aPTT  CBC daily while on heparin  Renda Rolls, PharmD, [facility] 03/12/2021 1:59 AM

## 2021-03-13 LAB — BASIC METABOLIC PANEL
Anion gap: 8 (ref 5–15)
BUN: 43 mg/dL — ABNORMAL HIGH (ref 8–23)
CO2: 20 mmol/L — ABNORMAL LOW (ref 22–32)
Calcium: 9 mg/dL (ref 8.9–10.3)
Chloride: 110 mmol/L (ref 98–111)
Creatinine, Ser: 3.35 mg/dL — ABNORMAL HIGH (ref 0.44–1.00)
GFR, Estimated: 14 mL/min — ABNORMAL LOW (ref >60)
Glucose, Bld: 91 mg/dL (ref 70–99)
Potassium: 4.1 mmol/L (ref 3.5–5.1)
Sodium: 138 mmol/L (ref 135–145)

## 2021-03-13 LAB — GLUCOSE, CAPILLARY
Glucose-Capillary: 75 mg/dL (ref 70–99)
Glucose-Capillary: 105 mg/dL — ABNORMAL HIGH (ref 70–99)

## 2021-03-13 MED ORDER — ASPIRIN 81 MG PO CHEW: 81.0000 mg | CHEWABLE_TABLET | Freq: Every day | ORAL | 0 refills | Status: DC

## 2021-03-13 MED ORDER — ATORVASTATIN CALCIUM 80 MG PO TABS
80.0000 mg | ORAL_TABLET | Freq: Every day | ORAL | 3 refills | Status: DC
Start: 2021-03-14 — End: 2021-09-30

## 2021-03-13 MED ORDER — CLOPIDOGREL BISULFATE 75 MG PO TABS
75.0000 mg | ORAL_TABLET | Freq: Every day | ORAL | 3 refills | Status: DC
Start: 2021-03-14 — End: 2021-09-30

## 2021-03-13 MED ORDER — APIXABAN 5 MG PO TABS
5.0000 mg | ORAL_TABLET | Freq: Two times a day (BID) | ORAL | 3 refills | Status: DC
Start: 2021-03-13 — End: 2021-09-30

## 2021-03-13 MED ORDER — NITROGLYCERIN 0.4 MG SL SUBL
0.4000 mg | SUBLINGUAL_TABLET | SUBLINGUAL | 12 refills | Status: AC | PRN
Start: 2021-03-13 — End: ?

## 2021-03-13 MED ORDER — CARVEDILOL 6.25 MG PO TABS
6.2500 mg | ORAL_TABLET | Freq: Two times a day (BID) | ORAL | 3 refills | Status: DC
Start: 2021-03-13 — End: 2021-09-30

## 2021-03-13 NOTE — Progress Notes (Signed)
Patient informed CSW that her PCP is on [address] but cannot remember [address]. Patient Believes its [name]. CSW found a Serafina Royals at [address] with [facility].

## 2021-03-13 NOTE — Progress Notes (Signed)
CSW spoke with patients son who did not know the full name of patients PCP. The son stated he would be here after 5:00 PM when he gets off work to pick patient up.

## 2021-03-13 NOTE — Discharge Instructions (Signed)
Pt to reschedule appt with PCP

## 2021-03-13 NOTE — Progress Notes (Signed)
CSW spoke with patient to notify her of the recommendations of home health services. CSW told patient that it might be difficult with her insurance to find a home health provider. Patient stated she understands. CSW ordered 3 and 1/walker to be delivered to patients room upon discharge. CSW contacted [facility] to see if they would be willing to take patient. CSW is waiting for a response.

## 2021-03-13 NOTE — Progress Notes (Signed)
[facility]  ROUNDING NOTE   Subjective:   Patient seen today in ICU Patient resting comfortably Patient offers no new specific physical complaints    Objective:  Vital signs in last 24 hours:  Temp:  [98.1 F (36.7 C)] 98.1 F (36.7 C) (11/05 2000) Resp:  [13-26] 17 (11/06 0700) BP: (91-198)/(59-108) 136/96 (11/06 0700)  Weight change:  Filed Weights   03/08/21 1749 03/09/21 0554  Weight: 105.2 kg 87.7 kg    Intake/Output: I/O last 3 completed shifts: In: 225.3 [I.V.:225.3] Out: 275 [Urine:275]   Intake/Output this shift:  Total I/O In: -  Out: 650 [Urine:650]  Physical Exam: General: NAD, sitting up in bed  Head: Normocephalic, atraumatic. Moist oral mucosal membranes  Eyes: Anicteric  Lungs:  Clear to auscultation, normal effort  Heart: Regular rate and rhythm  Abdomen:  Soft, nontender  Extremities:  no peripheral edema.  Neurologic: Nonfocal, moving all four extremities  Skin: No lesions       Basic Metabolic Panel: Recent Labs  Lab 03/08/21 2057 03/09/21 0629 03/10/21 0458 03/11/21 0427 03/12/21 0757 03/13/21 0756  NA  --  136 137 135 137 138  K  --  4.7 5.0 4.7 4.5 4.1  CL  --  109 109 109 110 110  CO2  --  19* 19* 17* 20* 20*  GLUCOSE  --  126* 83 84 81 91  BUN  --  33* 41* 44* 44* 43*  CREATININE  --  2.35* 3.23* 3.78* 3.54* 3.35*  CALCIUM  --  9.0 8.5* 8.4* 8.8* 9.0  MG 1.8  --   --  1.8  --   --     Liver Function Tests: Recent Labs  Lab 03/08/21 2057  AST 29  ALT 11  ALKPHOS 139*  BILITOT 0.9  PROT 9.0*  ALBUMIN 3.3*   No results for input(s): LIPASE, AMYLASE in the last 168 hours. No results for input(s): AMMONIA in the last 168 hours.  CBC: Recent Labs  Lab 03/09/21 0629 03/10/21 0458 03/11/21 0009 03/11/21 0427 03/12/21 0757  WBC 5.9 6.5 6.5 6.1 4.8  NEUTROABS  --   --   --  4.0  --   HGB 11.8* 9.8* 9.8* 9.4* 10.5*  HCT 33.9* 29.9* 30.2* 28.4* 31.4*  MCV 85.4 85.7 85.6 84.5 84.2  PLT 169 166  153 147* 173    Cardiac Enzymes: No results for input(s): CKTOTAL, CKMB, CKMBINDEX, TROPONINI in the last 168 hours.  BNP: Invalid input(s): POCBNP  CBG: Recent Labs  Lab 03/12/21 0748 03/12/21 1121 03/12/21 1619 03/12/21 2249 03/13/21 0740  GLUCAP 70 109* 155* 101* 75    Microbiology: Results for orders placed or performed during the hospital encounter of 03/08/21  Resp Panel by RT-PCR (Flu A&B, Covid) Nasopharyngeal Swab     Status: None   Collection Time: 03/08/21  8:57 PM   Specimen: Nasopharyngeal Swab; Nasopharyngeal(NP) swabs in vial transport medium  Result Value Ref Range Status   SARS Coronavirus 2 by RT PCR NEGATIVE NEGATIVE Final    Comment: (NOTE) SARS-CoV-2 target nucleic acids are NOT DETECTED.  The SARS-CoV-2 RNA is generally detectable in upper respiratory specimens during the acute phase of infection. The lowest concentration of SARS-CoV-2 viral copies this assay can detect is 138 copies/mL. A negative result does not preclude SARS-Cov-2 infection and should not be used as the sole basis for treatment or other patient management decisions. A negative result may occur with  improper specimen collection/handling, submission of specimen other  than nasopharyngeal swab, presence of viral mutation(s) within the areas targeted by this assay, and inadequate number of viral copies(<138 copies/mL). A negative result must be combined with clinical observations, patient history, and epidemiological information. The expected result is Negative.  Fact Sheet for Patients:  EntrepreneurPulse.com.au  Fact Sheet for Healthcare Providers:  IncredibleEmployment.be  This test is no t yet approved or cleared by the [place] FDA and  has been authorized for detection and/or diagnosis of SARS-CoV-2 by FDA under an Emergency Use Authorization (EUA). This EUA will remain  in effect (meaning this test can be used) for the duration of  the COVID-19 declaration under Section 564(b)(1) of the Act, 21 U.S.C.section 360bbb-3(b)(1), unless the authorization is terminated  or revoked sooner.       Influenza A by PCR NEGATIVE NEGATIVE Final   Influenza B by PCR NEGATIVE NEGATIVE Final    Comment: (NOTE) The Xpert Xpress SARS-CoV-2/FLU/RSV plus assay is intended as an aid in the diagnosis of influenza from Nasopharyngeal swab specimens and should not be used as a sole basis for treatment. Nasal washings and aspirates are unacceptable for Xpert Xpress SARS-CoV-2/FLU/RSV testing.  Fact Sheet for Patients: EntrepreneurPulse.com.au  Fact Sheet for Healthcare Providers: IncredibleEmployment.be  This test is not yet approved or cleared by the [place] FDA and has been authorized for detection and/or diagnosis of SARS-CoV-2 by FDA under an Emergency Use Authorization (EUA). This EUA will remain in effect (meaning this test can be used) for the duration of the COVID-19 declaration under Section 564(b)(1) of the Act, 21 U.S.C. section 360bbb-3(b)(1), unless the authorization is terminated or revoked.  Performed at [facility], [address] [place], [place] [address]   MRSA Next Gen by PCR, Nasal     Status: None   Collection Time: 03/09/21  5:50 AM   Specimen: Nasal Mucosa; Nasal Swab  Result Value Ref Range Status   MRSA by PCR Next Gen NOT DETECTED NOT DETECTED Final    Comment: (NOTE) The GeneXpert MRSA Assay (FDA approved for NASAL specimens only), is one component of a comprehensive MRSA colonization surveillance program. It is not intended to diagnose MRSA infection nor to guide or monitor treatment for MRSA infections. Test performance is not FDA approved in patients less than 59 years old. Performed at [facility], [address] [place], [place] [address]     Coagulation Studies: No results for input(s): LABPROT, INR in the last 72  hours.   Urinalysis: No results for input(s): COLORURINE, LABSPEC, PHURINE, GLUCOSEU, HGBUR, BILIRUBINUR, KETONESUR, PROTEINUR, UROBILINOGEN, NITRITE, LEUKOCYTESUR in the last 72 hours.  Invalid input(s): APPERANCEUR    Imaging: No results found.   Medications:    sodium chloride      amiodarone  200 mg Oral Daily   apixaban  5 mg Oral BID   aspirin  81 mg Oral Daily   atorvastatin  80 mg Oral Daily   carvedilol  6.25 mg Oral BID WC   Chlorhexidine Gluconate Cloth  6 each Topical Daily   clopidogrel  75 mg Oral Q breakfast   insulin aspart  0-15 Units Subcutaneous TID WC   insulin aspart  0-5 Units Subcutaneous QHS   pantoprazole  20 mg Oral Daily   sodium chloride flush  3 mL Intravenous Q12H   sodium chloride flush  3 mL Intravenous Q12H   sodium chloride, acetaminophen **OR** acetaminophen, acetaminophen, nitroGLYCERIN, ondansetron (ZOFRAN) IV, ondansetron **OR** [DISCONTINUED] ondansetron (ZOFRAN) IV, sodium chloride flush  Assessment/ Plan:  Susan Mclean is a 70 y.o.  female with medical problems of stroke, coronary disease with previous history of MI 2021, atrial fibrillation anticoagulation with Eliquis, CHF, hypertension, diabetes, CKD  was admitted on 03/08/2021 for : NSTEMI (non-ST elevated myocardial infarction) ([place]) [I21.4] Hypertensive emergency [I16.1] Nonintractable headache, unspecified chronicity pattern, unspecified headache type [R51.9] Nausea and vomiting, unspecified vomiting type [R11.2] STEMI (ST elevation myocardial infarction) ([place]) [I21.3]      1)Renal    AKI Patient has AKI secondary to contrast nephropathy Patient did receive IV contrast on November 1 when patient underwent CT angiography for chest Patient thereafter had contrast exposure again on November 2 when patient underwent coronary angiography Patient had AKI on CKD Patient has baseline CKD stage IV Patient AKI is better as patient creatinine has come down from 3.8 to now  3.3  FYI: Contrast nephropathy: ICD-10-CM N14.11 is a new 2023 ICD-10-CM code that became effective on February 05, 2021.   2)HTN    Blood pressure is stable    3)Anemia of chronic disease  CBC Latest Ref Rng & Units 03/12/2021 03/11/2021 03/11/2021  WBC 4.0 - 10.5 K/uL 4.8 6.1 6.5  Hemoglobin 12.0 - 15.0 g/dL 10.5(L) 9.4(L) 9.8(L)  Hematocrit 36.0 - 46.0 % 31.4(L) 28.4(L) 30.2(L)  Platelets 150 - 400 K/uL 173 147(L) 153       HGb at goal (9--11)   4) NSTEMI Patient is s/p PCI Patient is being closely followed by currently and the primary team    5) Electrolytes   BMP Latest Ref Rng & Units 03/13/2021 03/12/2021 03/11/2021  Glucose 70 - 99 mg/dL 91 81 84  BUN 8 - 23 mg/dL 43(H) 44(H) 44(H)  Creatinine 0.44 - 1.00 mg/dL 3.35(H) 3.54(H) 3.78(H)  Sodium 135 - 145 mmol/L 138 137 135  Potassium 3.5 - 5.1 mmol/L 4.1 4.5 4.7  Chloride 98 - 111 mmol/L 110 110 109  CO2 22 - 32 mmol/L 20(L) 20(L) 17(L)  Calcium 8.9 - 10.3 mg/dL 9.0 8.8(L) 8.4(L)     Sodium Normonatremic   Potassium Normokalemic    7)Acid base   Patient bicarb is stable We will hold off on starting patient on sodium bicarb  Plan    We will continue current treatment      LOS: 4 Reign Dziuba s Axelle Szwed 11/6/20229:31 AM

## 2021-03-13 NOTE — Progress Notes (Signed)
CSW contacted [employer] and spoke with Susan Mclean to see if they can deliver 3 and 1/ walker to the home due to the family picking patient up earlier. Patients equipment will be delivered today or tomorrow.

## 2021-03-13 NOTE — Progress Notes (Signed)
[facility] agreed to take patient for home health services. Contact information given to patients son.

## 2021-03-13 NOTE — Progress Notes (Signed)
Progress Note  Patient Name: Susan Mclean Date of Encounter: 03/13/2021  [place] Cardiologist: Dr. Saunders Revel  Subjective   Denies chest pain or shortness of breath.  No acute events overnight.  Blood pressure improved.  Inpatient Medications    Scheduled Meds:  amiodarone  200 mg Oral Daily   apixaban  5 mg Oral BID   aspirin  81 mg Oral Daily   atorvastatin  80 mg Oral Daily   carvedilol  6.25 mg Oral BID WC   Chlorhexidine Gluconate Cloth  6 each Topical Daily   clopidogrel  75 mg Oral Q breakfast   insulin aspart  0-15 Units Subcutaneous TID WC   insulin aspart  0-5 Units Subcutaneous QHS   pantoprazole  20 mg Oral Daily   sodium chloride flush  3 mL Intravenous Q12H   sodium chloride flush  3 mL Intravenous Q12H   Continuous Infusions:  sodium chloride     PRN Meds: sodium chloride, acetaminophen **OR** acetaminophen, acetaminophen, nitroGLYCERIN, ondansetron (ZOFRAN) IV, ondansetron **OR** [DISCONTINUED] ondansetron (ZOFRAN) IV, sodium chloride flush   Vital Signs    Vitals:   03/13/21 0500 03/13/21 0600 03/13/21 0700 03/13/21 1202  BP: 98/61 116/68 (!) 136/96 128/69  Pulse:      Resp: 13 13 17 18   Temp:      TempSrc:      SpO2:      Weight:      Height:        Intake/Output Summary (Last 24 hours) at 03/13/2021 1252 Last data filed at 03/13/2021 0746 Gross per 24 hour  Intake --  Output 650 ml  Net -650 ml   Last 3 Weights 03/09/2021 03/08/2021  Weight (lbs) 193 lb 5.5 oz 232 lb  Weight (kg) 87.7 kg 105.235 kg      Telemetry    Sinus rhythm heart rate 85- Personally Reviewed  ECG     - Personally Reviewed  Physical Exam   GEN: No acute distress.   Neck: No JVD Cardiac: RRR, no murmurs, rubs, or gallops.  Respiratory: Diminished breath sounds at bases GI: Soft, nontender, non-distended  MS: No edema; No deformity. Neuro:  Nonfocal  Psych: Normal affect   Labs    High Sensitivity Troponin:   Recent Labs  Lab 03/08/21 1749  03/08/21 2057 03/09/21 0629 03/09/21 1328  TROPONINIHS 171* 1,735* >24,000* >24,000*     Chemistry Recent Labs  Lab 03/08/21 2057 03/09/21 0629 03/11/21 0427 03/12/21 0757 03/13/21 0756  NA  --    < > 135 137 138  K  --    < > 4.7 4.5 4.1  CL  --    < > 109 110 110  CO2  --    < > 17* 20* 20*  GLUCOSE  --    < > 84 81 91  BUN  --    < > 44* 44* 43*  CREATININE  --    < > 3.78* 3.54* 3.35*  CALCIUM  --    < > 8.4* 8.8* 9.0  MG 1.8  --  1.8  --   --   PROT 9.0*  --   --   --   --   ALBUMIN 3.3*  --   --   --   --   AST 29  --   --   --   --   ALT 11  --   --   --   --   ALKPHOS 139*  --   --   --   --  BILITOT 0.9  --   --   --   --   GFRNONAA  --    < > 12* 13* 14*  ANIONGAP  --    < > 9 7 8    < > = values in this interval not displayed.    Lipids  Recent Labs  Lab 03/10/21 0458  CHOL 105  TRIG 45  HDL 36*  LDLCALC 60  CHOLHDL 2.9    Hematology Recent Labs  Lab 03/11/21 0009 03/11/21 0427 03/12/21 0757  WBC 6.5 6.1 4.8  RBC 3.53* 3.36* 3.73*  HGB 9.8* 9.4* 10.5*  HCT 30.2* 28.4* 31.4*  MCV 85.6 84.5 84.2  MCH 27.8 28.0 28.2  MCHC 32.5 33.1 33.4  RDW 15.2 15.0 15.0  PLT 153 147* 173   Thyroid No results for input(s): TSH, FREET4 in the last 168 hours.  BNP Recent Labs  Lab 03/08/21 1749  BNP 215.2*    DDimer No results for input(s): DDIMER in the last 168 hours.   Radiology    No results found.  Cardiac Studies   Echo 03/2021 1. Left ventricular ejection fraction, by estimation, is 55%. The left  ventricle has normal function. The left ventricle demonstrates regional  wall motion abnormalities (see scoring diagram/findings for description).  There is mild left ventricular  hypertrophy. Left ventricular diastolic parameters are consistent with  Grade II diastolic dysfunction (pseudonormalization). There is moderate  hypokinesis of the left ventricular, basal-mid inferolateral wall.   2. Right ventricular systolic function is normal. The  right ventricular  size is normal.   3. The mitral valve is normal in structure. Mild mitral valve  regurgitation.   4. The aortic valve was not well visualized. Aortic valve regurgitation  is not visualized.   5. The inferior vena cava is normal in size with greater than 50%  respiratory variability, suggesting right atrial pressure of 3 mmHg.  [facility] 03/09/2021 Conclusions: Multivessel coronary artery disease, as detailed below, including 60% mid LAD stenosis just distal to previously placed stent, multifocal codominant LCx disease of up to 80% in the distal vessel as well as thrombotic occlusion of large OM3 branch (culprit for patient's NSTEMI), and 60% proximal/mid RCA disease as well as 90% stenosis involving acute marginal branch. Widely patent mid LAD stent. Normal left ventricular filling pressure. Successful PCI to OM 3 using Onyx Frontier 2.5 x 22 mm drug-eluting stent with 0% residual stenosis and TIMI-3 flow. Successful PCI to distal LCx using Onyx Frontier 2.75 x 12 mm drug-eluting stent with 0% residual stenosis and TIMI-3 flow.  Patient Profile     70 y.o. female with history of paroxysmal atrial fibrillation, CAD presenting with chest pain, diagnosed with NSTEMI, s/p PCI to LCx and OM 3.  Assessment & Plan    NSTEMI s/p DES to Lcx, OM3 -Aspirin, Plavix, Eliquis x1 week then Plavix and Eliquis only after. -Denies chest pain -Lipitor -Echo EF 55%, inferolateral wall hypokinesis  2.  Paroxysmal atrial fibrillation -Maintaining sinus rhythm -Continue amiodarone, Eliquis.   3.  Hypertension -cont Coreg 6.25 mg twice daily  Total encounter time 35 minutes  Greater than 50% was spent in counseling and coordination of care with the patient   Signed, Kate Sable, MD  03/13/2021, 12:52 PM

## 2021-03-13 NOTE — Discharge Summary (Signed)
[facility] NAME: Susan Mclean    MR#:  [id number]  [place]:  01/18/1951  DATE OF ADMISSION:  03/08/2021 ADMITTING PHYSICIAN: Athena Masse, MD  DATE OF DISCHARGE: 03/13/2021  PRIMARY CARE PHYSICIAN: No primary care provider on file.    ADMISSION DIAGNOSIS:  NSTEMI (non-ST elevated myocardial infarction) ([place]) [I21.4] Hypertensive emergency [I16.1] Nonintractable headache, unspecified chronicity pattern, unspecified headache type [R51.9] Nausea and vomiting, unspecified vomiting type [R11.2] STEMI (ST elevation myocardial infarction) ([place]) [I21.3]  DISCHARGE DIAGNOSIS:  NSTEMI S/p PCI and DES to LCx and OM3 Acute on CKD-IV PAF SECONDARY DIAGNOSIS:   Past Medical History:  Diagnosis Date   Atrial fibrillation (HCC)    CHF (congestive heart failure) (HCC)    HTN (hypertension)    Hypertension    MI (myocardial infarction) [facility])     HOSPITAL COURSE:   Susan Mclean is a 70 y.o. female with medical history significant for  CVA, CAD with history of previous MI in , A. fib on Eliquis, CHF, HTN and DM presenting with severe substernal and left-sided chest pain like a heaviness that started around 1400 Pain was severe, constant, and radiating to the back and left arm . She stated since the day prior she was having nausea and vomiting , up until the onset of the pain.      NSTEMI s/p PCI 03/09/21 CAD with hx MI 2021 w/ stent LAD - Patient with history of CAD and prior MI with stent presenting with NSTEMI, taken emergently to Cath Lab due to intractable pain unrelieved with IV nitroglycerin--now off - s/p DES to OM 3 and LCx and is chest pain-free - [facility] cardiology consult appreciated --11/3-- cont iv heparin gtt for now per Dr Rockey Situ --11/5--d/ced heparin gtt--restarted eliquis by cardiology -- Per Dr End ---recommends aspirin for one week, Plavix 75 mg daily for at least 12 months and resume eliquis at  discharge --11/6-- Overall doing well   DM type 2 (diabetes mellitus, type 2) with CKD-IV - Sliding scale insulin coverage --sugars stable -- a1c 6.2% --diet control   Acute on CKD-IV suspect contrast induced Baseline creat 2.5 --creat upto 3.25 after cath and CT angio chest--3.78--3.5--3.3 --good UOP -- nephrology consultation with Dr. Candiss Norse appreciated. Avoid nephrotoxins --holding lasix at d/c for now--can be resumed as out pt if neded --f/u Dr Candiss Norse as out pt    Atrial fibrillation ([place]) Chronic anticoagulation at home (on eliquis) - patient was on IV heparin drip for now while monitoring hgb --hgb 11.8--9.8. no bleeding--9.4 --now resumed PO eliquis    obesity, Class III, BMI 40-49.9 (morbid obesity) ([place]) -Complicating factor to overall prognosis and care     HTN (hypertension) - continue carvedilol, amlodipine   PT recommends home health PT   Procedures: cardiac cath Family communication : son Landry Mellow on the phone 11/6 Consults : [facility] cardiology CODE STATUS: full DVT Prophylaxis : eliquis Level of care: Med-Surg Status is: Inpatient  D/c home with HHPT CONSULTS OBTAINED:  Treatment Team:  Nelva Bush, MD Thompson Grayer, MD  DRUG ALLERGIES:  No Known Allergies  DISCHARGE MEDICATIONS:   Allergies as of 03/13/2021   No Known Allergies      Medication List     STOP taking these medications    furosemide 20 MG tablet Commonly known as: LASIX   hydrochlorothiazide 12.5 MG capsule Commonly known as: MICROZIDE   isosorbide dinitrate 10 MG tablet Commonly known  as: ISORDIL       TAKE these medications    amiodarone 200 MG tablet Commonly known as: PACERONE Take 200 mg by mouth 2 (two) times daily.   amLODipine 10 MG tablet Commonly known as: NORVASC Take 10 mg by mouth daily.   apixaban 5 MG Tabs tablet Commonly known as: ELIQUIS Take 1 tablet (5 mg total) by mouth 2 (two) times daily.   aspirin 81 MG chewable  tablet Chew 1 tablet (81 mg total) by mouth daily. For 2 days than stop Start taking on: March 14, 2021   atorvastatin 80 MG tablet Commonly known as: LIPITOR Take 1 tablet (80 mg total) by mouth daily. Start taking on: March 14, 2021   carvedilol 6.25 MG tablet Commonly known as: COREG Take 1 tablet (6.25 mg total) by mouth 2 (two) times daily with a meal.   clopidogrel 75 MG tablet Commonly known as: PLAVIX Take 1 tablet (75 mg total) by mouth daily with breakfast. Start taking on: March 14, 2021   nitroGLYCERIN 0.4 MG SL tablet Commonly known as: NITROSTAT Place 1 tablet (0.4 mg total) under the tongue every 5 (five) minutes as needed for chest pain.               Durable Medical Equipment  (From admission, onward)           Start     Ordered   03/13/21 0948  DME 3-in-1  Once        03/13/21 0948   03/13/21 0932  For home use only DME Walker rolling  Once       Question Answer Comment  Walker: With 5 Inch Wheels   Patient needs a walker to treat with the following condition General weakness      03/13/21 0933            If you experience worsening of your admission symptoms, develop shortness of breath, life threatening emergency, suicidal or homicidal thoughts you must seek medical attention immediately by calling 911 or calling your MD immediately  if symptoms less severe.  You Must read complete instructions/literature along with all the possible adverse reactions/side effects for all the Medicines you take and that have been prescribed to you. Take any new Medicines after you have completely understood and accept all the possible adverse reactions/side effects.   Please note  You were cared for by a hospitalist during your hospital stay. If you have any questions about your discharge medications or the care you received while you were in the hospital after you are discharged, you can call the unit and asked to speak with the hospitalist on call  if the hospitalist that took care of you is not available. Once you are discharged, your primary care physician will handle any further medical issues. Please note that NO REFILLS for any discharge medications will be authorized once you are discharged, as it is imperative that you return to your primary care physician (or establish a relationship with a primary care physician if you do not have one) for your aftercare needs so that they can reassess your need for medications and monitor your lab values. Today   SUBJECTIVE   Doing well. Mild HA  VITAL SIGNS:  Blood pressure (!) 136/96, pulse 75, temperature 98.1 F (36.7 C), resp. rate 17, height 5\' 1"  (1.549 m), weight 87.7 kg, SpO2 99 %.  I/O:   Intake/Output Summary (Last 24 hours) at 03/13/2021 1004 Last data filed at 03/13/2021 [phone]  Gross per 24 hour  Intake --  Output 650 ml  Net -650 ml    PHYSICAL EXAMINATION:  GENERAL:  70 y.o.-year-old patient lying in the bed with no acute distress.  \LUNGS: Normal breath sounds bilaterally, no wheezing, rales,rhonchi or crepitation. No use of accessory muscles of respiration.  CARDIOVASCULAR: S1, S2 normal. No murmurs, rubs, or gallops.  ABDOMEN: Soft, non-tender, non-distended. Bowel sounds present. No organomegaly or mass.  EXTREMITIES: No pedal edema, cyanosis, or clubbing.  NEUROLOGIC: Muscle strength 5/5 in all extremities. Sensation intact. Gait not checked.  PSYCHIATRIC: patient is alert and oriented x 3.  SKIN: No obvious rash, lesion, or ulcer.   DATA REVIEW:   CBC  Recent Labs  Lab 03/12/21 0757  WBC 4.8  HGB 10.5*  HCT 31.4*  PLT 173    Chemistries  Recent Labs  Lab 03/08/21 2057 03/09/21 0629 03/11/21 0427 03/12/21 0757 03/13/21 0756  NA  --    < > 135   < > 138  K  --    < > 4.7   < > 4.1  CL  --    < > 109   < > 110  CO2  --    < > 17*   < > 20*  GLUCOSE  --    < > 84   < > 91  BUN  --    < > 44*   < > 43*  CREATININE  --    < > 3.78*   < > 3.35*   CALCIUM  --    < > 8.4*   < > 9.0  MG 1.8  --  1.8  --   --   AST 29  --   --   --   --   ALT 11  --   --   --   --   ALKPHOS 139*  --   --   --   --   BILITOT 0.9  --   --   --   --    < > = values in this interval not displayed.    Microbiology Results   Recent Results (from the past 240 hour(s))  Resp Panel by RT-PCR (Flu A&B, Covid) Nasopharyngeal Swab     Status: None   Collection Time: 03/08/21  8:57 PM   Specimen: Nasopharyngeal Swab; Nasopharyngeal(NP) swabs in vial transport medium  Result Value Ref Range Status   SARS Coronavirus 2 by RT PCR NEGATIVE NEGATIVE Final    Comment: (NOTE) SARS-CoV-2 target nucleic acids are NOT DETECTED.  The SARS-CoV-2 RNA is generally detectable in upper respiratory specimens during the acute phase of infection. The lowest concentration of SARS-CoV-2 viral copies this assay can detect is 138 copies/mL. A negative result does not preclude SARS-Cov-2 infection and should not be used as the sole basis for treatment or other patient management decisions. A negative result may occur with  improper specimen collection/handling, submission of specimen other than nasopharyngeal swab, presence of viral mutation(s) within the areas targeted by this assay, and inadequate number of viral copies(<138 copies/mL). A negative result must be combined with clinical observations, patient history, and epidemiological information. The expected result is Negative.  Fact Sheet for Patients:  EntrepreneurPulse.com.au  Fact Sheet for Healthcare Providers:  IncredibleEmployment.be  This test is no t yet approved or cleared by the [place] FDA and  has been authorized for detection and/or diagnosis of SARS-CoV-2 by FDA under an Emergency Use Authorization (EUA). This EUA  will remain  in effect (meaning this test can be used) for the duration of the COVID-19 declaration under Section 564(b)(1) of the Act,  21 U.S.C.section 360bbb-3(b)(1), unless the authorization is terminated  or revoked sooner.       Influenza A by PCR NEGATIVE NEGATIVE Final   Influenza B by PCR NEGATIVE NEGATIVE Final    Comment: (NOTE) The Xpert Xpress SARS-CoV-2/FLU/RSV plus assay is intended as an aid in the diagnosis of influenza from Nasopharyngeal swab specimens and should not be used as a sole basis for treatment. Nasal washings and aspirates are unacceptable for Xpert Xpress SARS-CoV-2/FLU/RSV testing.  Fact Sheet for Patients: EntrepreneurPulse.com.au  Fact Sheet for Healthcare Providers: IncredibleEmployment.be  This test is not yet approved or cleared by the [place] FDA and has been authorized for detection and/or diagnosis of SARS-CoV-2 by FDA under an Emergency Use Authorization (EUA). This EUA will remain in effect (meaning this test can be used) for the duration of the COVID-19 declaration under Section 564(b)(1) of the Act, 21 U.S.C. section 360bbb-3(b)(1), unless the authorization is terminated or revoked.  Performed at [facility], [address] [place], [place] [address]   MRSA Next Gen by PCR, Nasal     Status: None   Collection Time: 03/09/21  5:50 AM   Specimen: Nasal Mucosa; Nasal Swab  Result Value Ref Range Status   MRSA by PCR Next Gen NOT DETECTED NOT DETECTED Final    Comment: (NOTE) The GeneXpert MRSA Assay (FDA approved for NASAL specimens only), is one component of a comprehensive MRSA colonization surveillance program. It is not intended to diagnose MRSA infection nor to guide or monitor treatment for MRSA infections. Test performance is not FDA approved in patients less than 46 years old. Performed at [facility], [address] [place], [place] [address]     RADIOLOGY:  No results found.   CODE STATUS:     Code Status Orders  (From admission, onward)           Start     Ordered    03/09/21 1704  Full code  Continuous        03/09/21 1703           Code Status History     Date Active Date Inactive Code Status Order ID Comments User Context   03/09/2021 0510 03/09/2021 1703 Full Code [id number]  Nelva Bush, MD Inpatient        TOTAL TIME TAKING CARE OF THIS PATIENT: 40 minutes.    Fritzi Mandes M.D  [facility]    CC: Primary care physician; No primary care provider on file.

## 2021-03-13 NOTE — Progress Notes (Signed)
CSW contacted [facility] who cannot take patient for home health unless paired with Medicare. [facility] will call CSW back to see if they can provide services for patient.

## 2021-03-13 NOTE — Progress Notes (Signed)
Pt ready for discharge, IV removed, clothing returned to patient, NAD.  Pt reports feeling well, able to dress without assistance, ambulates with walker.  Discharged with instructions, pt reports being unable to obtain medications until tomorrow night; dispensed new prescriptions for tomorrow AM to send with patient.  Educated patient on new medications and follow ups.  Pt verbalized understanding.  Pt escorted to medical mall entrance by NT via wheelchair.  Left by private vehicle with family member.

## 2021-03-13 NOTE — Progress Notes (Signed)
CSW received a call back from [facility] who stated patients insurance is not in their network. CSW was also informed that patient will need to establish a PCP before getting home health services. No PCP is on file. CSW will speak with patient.

## 2021-03-13 NOTE — Progress Notes (Addendum)
CSW contacted the following home health agencies    [facility]- Left message [facility]- Not able to take patient insurance [facility]- Left message (UPDATE: LOW STAFFING UNABLE TO TAKE PATIENT). [facility]-Not able to take patients insurance. Low staffing.

## 2021-03-15 ENCOUNTER — Telehealth: Payer: Self-pay | Admitting: Internal Medicine

## 2021-03-15 NOTE — Telephone Encounter (Signed)
Cheryl with [facility] calling States that the hospital gave nursing and physical therapy orders but they would like to know if they can call Dr End with any additional orders - patient does not have a PCP Please call to discuss

## 2021-03-15 NOTE — Telephone Encounter (Signed)
Called and LDM with Malachy Mood with [facility] that pt should have PCP established so that home heatlh skilled nursing and PT orders may be managed appropriately.

## 2021-03-18 NOTE — Progress Notes (Deleted)
Cardiology Office Note    Date:  03/18/2021   ID:  Envy Meno, DOB 20-Nov-1950, MRN [id number]  PCP:  No primary care provider on file.  Cardiologist:  Nelva Bush, MD  Electrophysiologist:  None   Chief Complaint: Hospital follow up  History of Present Illness:   Erza Mothershead is a 70 y.o. female with history of CAD with MI in 2021 in [place], HFpEF, PAF on apixaban, CVA, HTN, HLD, diabetes mellitus, and CKD stage *** who presents for hospital follow-up as outlined below.  She was admitted to the hospital in [place] and 2021 with an MI, that was medically managed.  Hospitalization was prolonged with AKI requiring temporary hemodialysis as well as mechanical ventilation.  Further details of this past utilization are unclear.  She has previously undergone PCI to the LAD with details being unclear.  She was admitted to [facility] from 11/1 through 11/6 with an NSTEMI.  She was markedly hypertensive upon arrival and underwent CT head which showed no acute intracranial abnormality as well as a CTA chest/aorta which showed no evidence of PE or acute aortic pathology.  High-sensitivity troponin peaked at greater than 24,000.  EKG demonstrated sinus rhythm with inferolateral T wave inversion.  BNP 215.  Despite risks of contrast-induced nephropathy she elected to proceed with LHC, which demonstrated multivessel CAD including 60% mid LAD stenosis just distal to the previously placed stent, multifocal codominant LCx disease of up to 80% in the distal vessel as well as a thrombotic occlusion of a large OM 3 branch which was felt to be the culprit lesion for the patient's NSTEMI.  There was also 60% proximal/mid RCA stenosis as well as 90% stenosis involving acute marginal branch.  The previously placed mid LAD stent was widely patent.  There was normal LV filling pressure.  She underwent successful PCI/DES to the distal LCx and OM 3.  Echo demonstrated an EF of 55%, mild LVH, grade 2 diastolic dysfunction,  moderate hypokinesis of the basal mid inferior lateral wall, normal RV systolic function and ventricular cavity size, mild mitral regurgitation, and an estimated right atrial pressure of 3 mmHg.  Post-cath course was complicated by AKI, felt to likely be secondary to contrast induced nephropathy in the context of the patient having received contrast for CTA of the chest in the ED followed by LHC that same day.  Patient was made aware of the risks of contrast-induced nephropathy prior to proceeding with cath and elected to proceed.  At time of discharge, it was recommended she be maintained on triple therapy with aspirin, clopidogrel, and apixaban for 1 week followed by clopidogrel and Eliquis only thereafter.  ***   Labs independently reviewed: 03/2021 - potassium 4.1, BUN 43, serum creatinine 3.35, Hgb 10.5, PLT 173, magnesium 1.8, TC 105, TG 45, HDL 36, LDL 60, A1c 6.2, albumin 3.3, AST/ALT normal  Past Medical History:  Diagnosis Date   Atrial fibrillation (HCC)    CHF (congestive heart failure) ([place])    HTN (hypertension)    Hypertension    MI (myocardial infarction) ([place])     Past Surgical History:  Procedure Laterality Date   CORONARY STENT INTERVENTION N/A 03/09/2021   Procedure: CORONARY STENT INTERVENTION;  Surgeon: Nelva Bush, MD;  Location: [place] [facility];  Service: Cardiovascular;  Laterality: N/A;   CORONARY STENT PLACEMENT     LEFT HEART CATH AND CORONARY ANGIOGRAPHY N/A 03/09/2021   Procedure: LEFT HEART CATH AND CORONARY ANGIOGRAPHY;  Surgeon: Nelva Bush, MD;  Location: [place] [facility];  Service: Cardiovascular;  Laterality: N/A;    Current Medications: No outpatient medications have been marked as taking for the 03/21/21 encounter (Appointment) with Rise Mu, PA-C.    Allergies:   Patient has no known allergies.   Social History   Socioeconomic History   Marital status: Unknown    Spouse name: Not on file   Number of children: Not on file    Years of education: Not on file   Highest education level: Not on file  Occupational History   Not on file  Tobacco Use   Smoking status: Never   Smokeless tobacco: Never  Substance and Sexual Activity   Alcohol use: Not Currently   Drug use: Never   Sexual activity: Not on file  Other Topics Concern   Not on file  Social History Narrative   Not on file   Social Determinants of Health   Financial Resource Strain: Not on file  Food Insecurity: Not on file  Transportation Needs: Not on file  Physical Activity: Not on file  Stress: Not on file  Social Connections: Not on file     Family History:  The patient's family history is not on file.  ROS:   ROS   EKGs/Labs/Other Studies Reviewed:    Studies reviewed were summarized above. The additional studies were reviewed today: ***  EKG:  EKG is ordered today.  The EKG ordered today demonstrates ***  Recent Labs: 03/08/2021: ALT 11; B Natriuretic Peptide 215.2 03/11/2021: Magnesium 1.8 03/12/2021: Hemoglobin 10.5; Platelets 173 03/13/2021: BUN 43; Creatinine, Ser 3.35; Potassium 4.1; Sodium 138  Recent Lipid Panel    Component Value Date/Time   CHOL 105 03/10/2021 0458   TRIG 45 03/10/2021 0458   HDL 36 (L) 03/10/2021 0458   CHOLHDL 2.9 03/10/2021 0458   VLDL 9 03/10/2021 0458   LDLCALC 60 03/10/2021 0458    PHYSICAL EXAM:    VS:  There were no vitals taken for this visit.  BMI: There is no height or weight on file to calculate BMI.  Physical Exam  Wt Readings from Last 3 Encounters:  03/09/21 193 lb 5.5 oz (87.7 kg)     ASSESSMENT & PLAN:   CAD involving the native coronary arteries with recent NSTEMI without***angina:  HFpEF:  PAF:  HTN: Blood pressure ***  HLD: LDL 60 in 03/2021 with normal AST/ALT at that time.  Acute on CKD stage ***: Baseline serum creatinine remains unknown.  DM2: A1c 6.2.   {Are you ordering a CV Procedure (e.g. stress test, cath, DCCV, TEE, etc)?   Press F2         :[id number]}     Disposition: F/u with Dr. Saunders Revel or an APP in ***.   Medication Adjustments/Labs and Tests Ordered: Current medicines are reviewed at length with the patient today.  Concerns regarding medicines are outlined above. Medication changes, Labs and Tests ordered today are summarized above and listed in the Patient Instructions accessible in Encounters.   Signed, Christell Faith, PA-C 03/18/2021 9:47 AM     [place] [address] [place] [address] [place], [place] [address] [phone]

## 2021-03-21 ENCOUNTER — Ambulatory Visit: Payer: 59 | Admitting: Physician Assistant

## 2021-03-22 ENCOUNTER — Encounter: Payer: Self-pay | Admitting: Physician Assistant

## 2021-07-28 NOTE — Progress Notes (Signed)
? ?Cardiology Office Note   ? ?Date:  07/29/2021  ? ?ID:  Susan Mclean, DOB 1950-06-11, MRN [id number] ? ?PCP:  Pcp, No  ?Cardiologist:  Nelva Bush, MD  ?Electrophysiologist:  None  ? ?Chief Complaint: Hospital follow-up ? ?History of Present Illness:  ? ?Susan Mclean is a 71 y.o. female with history of CAD remote LAD stenting and MI medically managed in the setting of prolonged hospitalization with intubation in [place] in 2021 with NSTEMI in 03/2021 status post PCI/DES to the distal LCx and OM3, HFpEF, PAF, CVA, HTN, HLD, DM2, CKD stage IV-V previously requiring temporary dialysis during extended hospitalization in [place] in 2021, anemia, vision disturbance of uncertain etiology, and obesity who presents for hospital follow-up as outlined below. ? ?She was admitted to the hospital from 11/1 through 11/6 with an NSTEMI after developing nausea and emesis followed by chest tightness the following morning that transiently improved with Imdur, though returned with severe pain.  In the ED, she continued to have significant chest pain despite initiation of nitro drip.  EKG showed sinus rhythm with inferolateral T wave inversions and nonspecific ST-T changes.  High-sensitivity troponin peaked at greater than 24,000.  BNP 215.  CTA aorta showed no evidence of thoracic aortic aneurysm or dissection, PE, or evidence of acute cardiopulmonary disease.  CT head showed no acute intracranial abnormality.  LHC on 02/06/2021 demonstrated multivessel CAD including 60% mid LAD stenosis just distal to previously placed stent, multifocal codominant LCx disease of up to 80% in the distal vessel as well as thrombotic occlusion of a large OM 3 branch (culprit for patient's NSTEMI), and a 60% proximal/mid RCA disease as well as 90% stenosis involving an acute marginal branch.  Widely patent mid LAD stent.  She underwent PCI/DES to OM3 and the distal LCx.  Post-cath, triple therapy was recommended for 1 week, after which time aspirin  could be discontinued and the patient maintained on clopidogrel and apixaban for 12 months.  Echo postprocedure demonstrated an EF of 55%, moderate hypokinesis of the basal mid inferior lateral wall, mild LVH, grade 2 diastolic dysfunction, normal RV systolic function and ventricular cavity size, mild mitral regurgitation, and an estimated right atrial pressure of 3 mmHg.  Admission was also notable for acute on CKD which was felt to be secondary to ATN from contrast exposure as well as a downtrending hemoglobin to 9.4 improved to 10.5 at time of discharge. ? ?She comes in today after having recently returned from [place].  She has not followed up with anyone since her hospital discharge in 03/2021.  She reports multiple complaints at this time as outlined below. ? ?CAD: She has had intermittent chest pain over the past week that is randomly occurring and nonexertional.  Symptoms do not feel similar to her prior angina.  She reports adherence to apixaban and carvedilol.  She did not take any aspirin following her hospital discharge.  No significant dyspnea.  No dizziness, presyncope, or syncope. ? ?Left lower extremity weakness and coldness: Over the past week she has noted her left lower extremity is significantly colder than her right lower extremity.  She has also noted a bluish color of her skin in the left lower extremity as well as some swelling.  She is unable to lift her left lower extremity when ambulating and has to "drag it."  She denies any left upper extremity change or speech deficit. ? ?Vision disturbance: She reports initially noting a significant decrease in her vision during her  admission in [place] in 1610 of uncertain etiology per her report.  More recently, over the past several weeks she has noted a significant decline in her vision. ? ?Fatigue: She notes profound fatigue that has persisted since her hospital discharge.  No dizziness, presyncope, or syncope. ? ?A-fib: She did have an episode  of tachypalpitations last week that was short-lived and improved with eating.  She remains on apixaban and carvedilol.  She is not taking amiodarone.  She has not had any falls since her hospital discharge though does feel very unsteady when ambulating.  She does use a walker.  No hematochezia or melena. ? ?CKD: She does not currently have a nephrologist or primary care physician. ? ?Initially, she was hoping to travel back to [place] tomorrow, though given her concerns with her left lower extremity, her son encouraged her to come to her cardiology appointment today for further evaluation. ? ? ?Labs independently reviewed: ?03/2021 - potassium 4.1, BUN 43, serum creatinine 3.35, Hgb 10.5, PLT 173, magnesium 1.8, TC 105, TG 45, HDL 36, LDL 60, A1c 6.2, albumin 3.3, AST/ALT normal ? ?Past Medical History:  ?Diagnosis Date  ? Atrial fibrillation ([place])   ? CHF (congestive heart failure) ([place])   ? HTN (hypertension)   ? Hypertension   ? MI (myocardial infarction) ([place])   ? ? ?Past Surgical History:  ?Procedure Laterality Date  ? CORONARY STENT INTERVENTION N/A 03/09/2021  ? Procedure: CORONARY STENT INTERVENTION;  Surgeon: Nelva Bush, MD;  Location: [facility];  Service: Cardiovascular;  Laterality: N/A;  ? CORONARY STENT PLACEMENT    ? LEFT HEART CATH AND CORONARY ANGIOGRAPHY N/A 03/09/2021  ? Procedure: LEFT HEART CATH AND CORONARY ANGIOGRAPHY;  Surgeon: Nelva Bush, MD;  Location: [facility];  Service: Cardiovascular;  Laterality: N/A;  ? ? ?Current Medications: ?Current Meds  ?Medication Sig  ? apixaban (ELIQUIS) 5 MG TABS tablet Take 1 tablet (5 mg total) by mouth 2 (two) times daily.  ? atorvastatin (LIPITOR) 80 MG tablet Take 1 tablet (80 mg total) by mouth daily.  ? carvedilol (COREG) 6.25 MG tablet Take 1 tablet (6.25 mg total) by mouth 2 (two) times daily with a meal.  ? clopidogrel (PLAVIX) 75 MG tablet Take 1 tablet (75 mg total) by mouth daily with breakfast.  ? nitroGLYCERIN  (NITROSTAT) 0.4 MG SL tablet Place 1 tablet (0.4 mg total) under the tongue every 5 (five) minutes as needed for chest pain.  ? [DISCONTINUED] aspirin 81 MG chewable tablet Chew 1 tablet (81 mg total) by mouth daily. For 2 days than stop  ? ? ?Allergies:   Patient has no known allergies.  ? ?Social History  ? ?Socioeconomic History  ? Marital status: Unknown  ?  Spouse name: Not on file  ? Number of children: Not on file  ? Years of education: Not on file  ? Highest education level: Not on file  ?Occupational History  ? Not on file  ?Tobacco Use  ? Smoking status: Never  ? Smokeless tobacco: Never  ?Substance and Sexual Activity  ? Alcohol use: Not Currently  ? Drug use: Never  ? Sexual activity: Not on file  ?Other Topics Concern  ? Not on file  ?Social History Narrative  ? Not on file  ? ?Social Determinants of Health  ? ?Financial Resource Strain: Not on file  ?Food Insecurity: Not on file  ?Transportation Needs: Not on file  ?Physical Activity: Not on file  ?Stress: Not on  file  ?Social Connections: Not on file  ?  ? ?Family History:  ?The patient's family history is not on file. ? ?ROS:   ?Review of Systems  ?Constitutional:  Positive for malaise/fatigue. Negative for chills, diaphoresis, fever and weight loss.  ?HENT:  Negative for congestion.   ?Eyes:  Negative for discharge and redness.  ?     Decreased vision  ?Respiratory:  Negative for cough, sputum production, shortness of breath and wheezing.   ?Cardiovascular:  Positive for chest pain, palpitations and leg swelling. Negative for orthopnea, claudication and PND.  ?     Unilateral left lower extremity swelling  ?Gastrointestinal:  Negative for abdominal pain, blood in stool, heartburn, melena, nausea and vomiting.  ?Musculoskeletal:  Negative for falls and myalgias.  ?Skin:  Negative for rash.  ?Neurological:  Positive for sensory change, focal weakness and weakness. Negative for dizziness, tingling, tremors, speech change and loss of consciousness.   ?Endo/Heme/Allergies:  Bruises/bleeds easily.  ?Psychiatric/Behavioral:  Negative for substance abuse. The patient is not nervous/anxious.   ?All other systems reviewed and are negative. ? ? ?EKGs/Labs/Othe

## 2021-07-29 ENCOUNTER — Emergency Department
Admission: EM | Admit: 2021-07-29 | Discharge: 2021-07-29 | Disposition: A | Discharge status: Home / Self Care | Payer: 59 | Attending: Student in an Organized Health Care Education/Training Program | Admitting: Student in an Organized Health Care Education/Training Program

## 2021-07-29 ENCOUNTER — Ambulatory Visit (INDEPENDENT_AMBULATORY_CARE_PROVIDER_SITE_OTHER): Payer: 59 | Admitting: Physician Assistant

## 2021-07-29 ENCOUNTER — Encounter: Payer: Self-pay | Admitting: Emergency Medicine

## 2021-07-29 ENCOUNTER — Other Ambulatory Visit: Payer: Self-pay

## 2021-07-29 ENCOUNTER — Encounter: Payer: Self-pay | Admitting: Physician Assistant

## 2021-07-29 ENCOUNTER — Emergency Department: Payer: 59

## 2021-07-29 VITALS — BP 146/100 | HR 55 | Ht 61.0 in | Wt 192.0 lb

## 2021-07-29 DIAGNOSIS — I10 Essential (primary) hypertension: Secondary | ICD-10-CM | POA: Diagnosis not present

## 2021-07-29 DIAGNOSIS — M7989 Other specified soft tissue disorders: Secondary | ICD-10-CM | POA: Diagnosis present

## 2021-07-29 DIAGNOSIS — R209 Unspecified disturbances of skin sensation: Secondary | ICD-10-CM

## 2021-07-29 DIAGNOSIS — D649 Anemia, unspecified: Secondary | ICD-10-CM

## 2021-07-29 DIAGNOSIS — Z7901 Long term (current) use of anticoagulants: Secondary | ICD-10-CM | POA: Insufficient documentation

## 2021-07-29 DIAGNOSIS — L819 Disorder of pigmentation, unspecified: Secondary | ICD-10-CM | POA: Diagnosis not present

## 2021-07-29 DIAGNOSIS — I25118 Atherosclerotic heart disease of native coronary artery with other forms of angina pectoris: Secondary | ICD-10-CM

## 2021-07-29 DIAGNOSIS — I82412 Acute embolism and thrombosis of left femoral vein: Secondary | ICD-10-CM | POA: Diagnosis not present

## 2021-07-29 DIAGNOSIS — I480 Paroxysmal atrial fibrillation: Secondary | ICD-10-CM

## 2021-07-29 DIAGNOSIS — M79606 Pain in leg, unspecified: Secondary | ICD-10-CM

## 2021-07-29 DIAGNOSIS — E785 Hyperlipidemia, unspecified: Secondary | ICD-10-CM

## 2021-07-29 DIAGNOSIS — I5032 Chronic diastolic (congestive) heart failure: Secondary | ICD-10-CM

## 2021-07-29 DIAGNOSIS — Z8673 Personal history of transient ischemic attack (TIA), and cerebral infarction without residual deficits: Secondary | ICD-10-CM

## 2021-07-29 DIAGNOSIS — N184 Chronic kidney disease, stage 4 (severe): Secondary | ICD-10-CM

## 2021-07-29 DIAGNOSIS — H539 Unspecified visual disturbance: Secondary | ICD-10-CM

## 2021-07-29 DIAGNOSIS — R5383 Other fatigue: Secondary | ICD-10-CM

## 2021-07-29 LAB — COMPREHENSIVE METABOLIC PANEL
ALT: 11 U/L (ref 0–44)
AST: 15 U/L (ref 15–41)
Albumin: 3.6 g/dL (ref 3.5–5.0)
Alkaline Phosphatase: 167 U/L — ABNORMAL HIGH (ref 38–126)
Anion gap: 7 (ref 5–15)
BUN: 34 mg/dL — ABNORMAL HIGH (ref 8–23)
CO2: 19 mmol/L — ABNORMAL LOW (ref 22–32)
Calcium: 8.8 mg/dL — ABNORMAL LOW (ref 8.9–10.3)
Chloride: 117 mmol/L — ABNORMAL HIGH (ref 98–111)
Creatinine, Ser: 2.25 mg/dL — ABNORMAL HIGH (ref 0.44–1.00)
GFR, Estimated: 23 mL/min — ABNORMAL LOW (ref >60)
Glucose, Bld: 122 mg/dL — ABNORMAL HIGH (ref 70–99)
Potassium: 4.5 mmol/L (ref 3.5–5.1)
Sodium: 143 mmol/L (ref 135–145)
Total Bilirubin: 0.9 mg/dL (ref 0.3–1.2)
Total Protein: 8.1 g/dL (ref 6.5–8.1)

## 2021-07-29 LAB — CBC
HCT: 36.9 % (ref 36.0–46.0)
Hemoglobin: 11.7 g/dL — ABNORMAL LOW (ref 12.0–15.0)
MCH: 27.6 pg (ref 26.0–34.0)
MCHC: 31.7 g/dL (ref 30.0–36.0)
MCV: 87 fL (ref 80.0–100.0)
Platelets: 123 10*3/uL — ABNORMAL LOW (ref 150–400)
RBC: 4.24 MIL/uL (ref 3.87–5.11)
RDW: 15.3 % (ref 11.5–15.5)
WBC: 4.7 10*3/uL (ref 4.0–10.5)
nRBC: 0 % (ref 0.0–0.2)

## 2021-07-29 LAB — TROPONIN I (HIGH SENSITIVITY): Troponin I (High Sensitivity): 17 ng/L (ref <18)

## 2021-07-29 IMAGING — MR MR HEAD W/O CM
11 series · 48 of 48 positions shown · non-contrast
Comparison: Head CT [DATE]

CLINICAL DATA: Acute neurologic deficit

EXAM:
MRI HEAD WITHOUT CONTRAST
TECHNIQUE: Multiplanar, multiecho pulse sequences of the brain and surrounding
structures were obtained without intravenous contrast.

[Series 5: ax dwi_tracew · axial · 3.0mm · 0.71mm/px · z∈[-146,+2]mm · 5 of 52 slices shown]
[im 1/52]
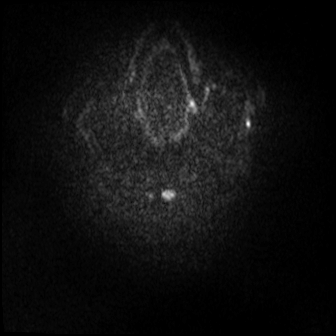
[im 13/52]
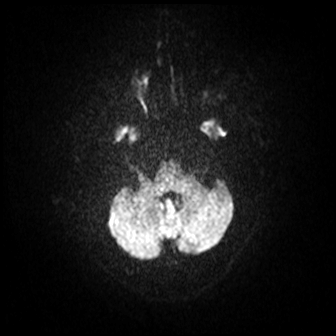
[im 26/52]
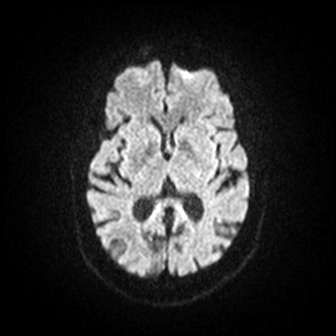
[im 39/52]
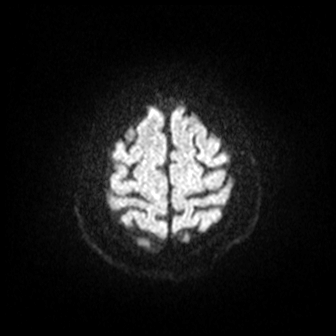
[im 52/52]
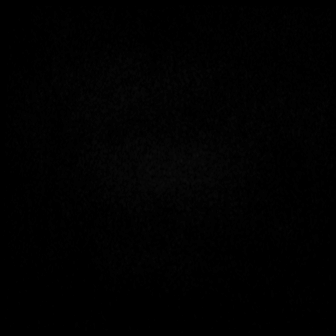

[Series 6: ax dwi_adc · axial · 3.0mm · 0.71mm/px · z∈[-146,-1]mm · 4 of 51 slices shown]
[im 1/51]
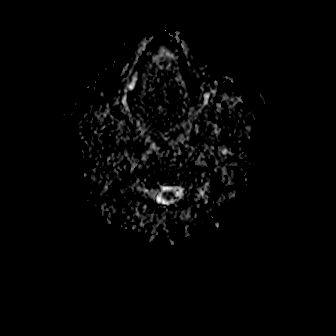
[im 17/51]
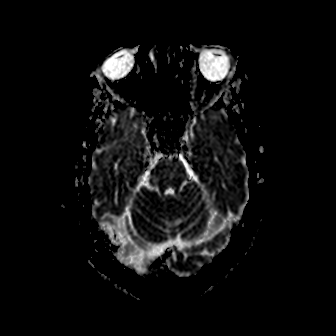
[im 34/51]
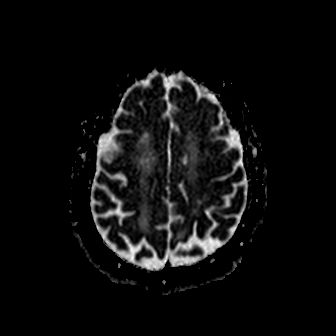
[im 51/51]
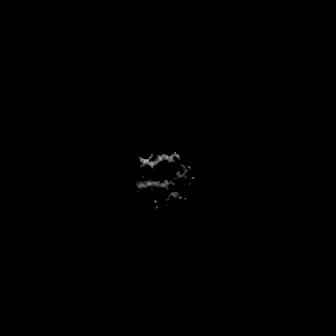

[Series 7: cor dwi_tracew · coronal · 5.0mm · 0.68mm/px · 3 of 36 slices shown]
[im 1/36]
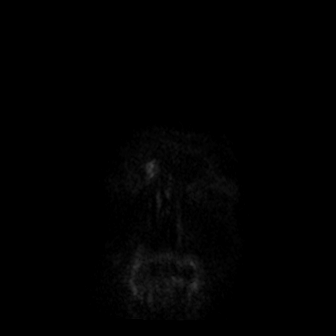
[im 18/36]
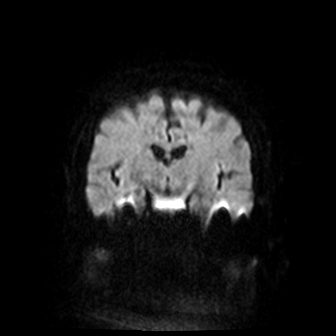
[im 36/36]
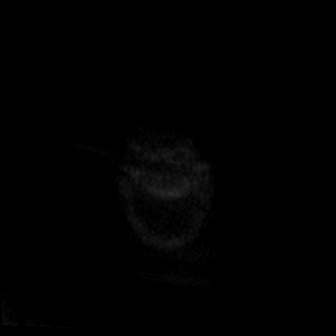

[Series 8: cor dwi_adc · coronal · 5.0mm · 0.68mm/px · 3 of 36 slices shown]
[im 1/36]
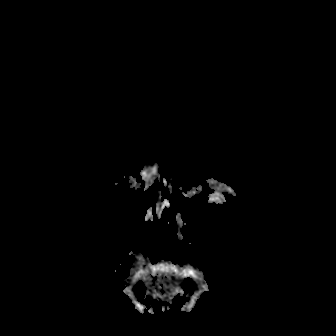
[im 18/36]
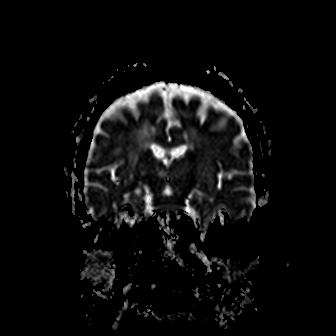
[im 36/36]
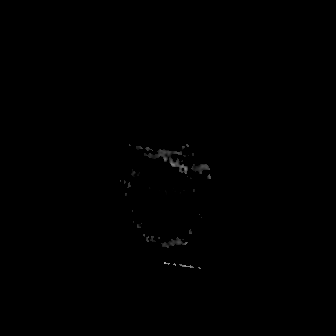

[Series 9: T1 · sagittal · 5.0mm · 0.47mm/px · 2 of 20 slices shown (1 of 2)]
[im 1/20]
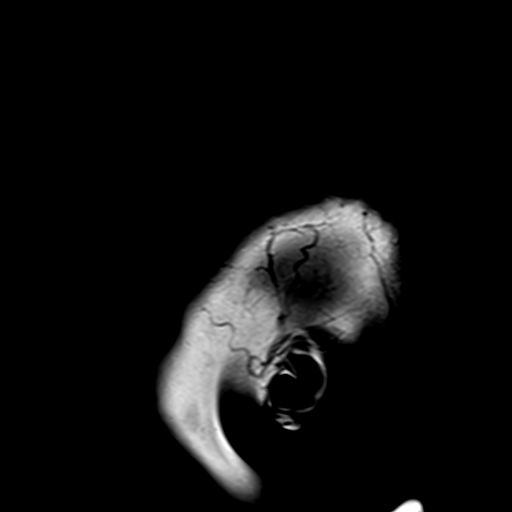
[im 20/20]
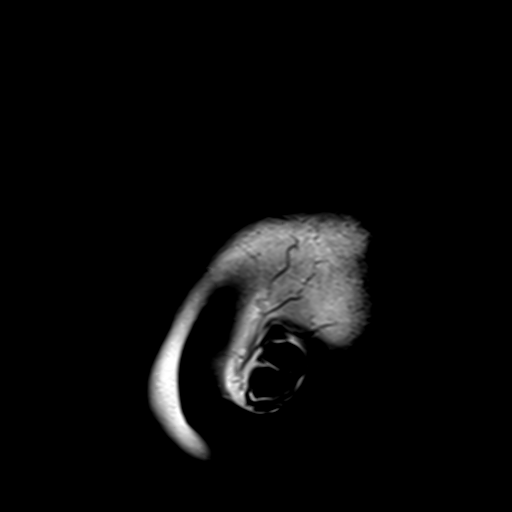

[Series 10: T2 · axial · 5.0mm · 0.86mm/px · z∈[-141,-2]mm · 2 of 25 slices shown (1 of 2)]
[im 1/25]
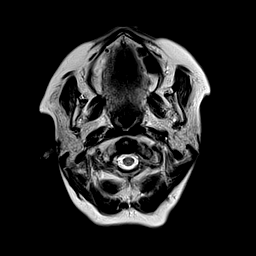
[im 25/25]
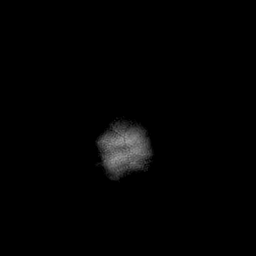

[Series 12: ax swi_pha · axial · 3.0mm · 0.90mm/px · z∈[-151,+8]mm · 5 of 56 slices shown]
[im 1/56]
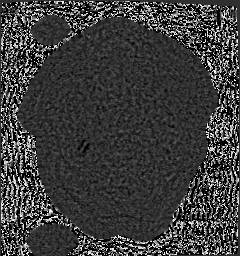
[im 14/56]
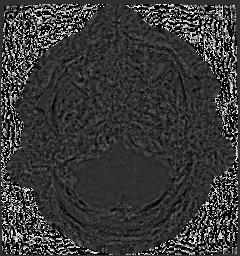
[im 28/56]
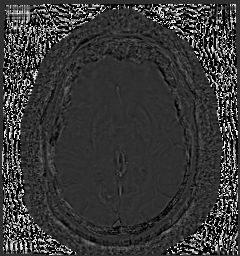
[im 42/56]
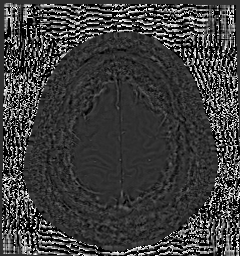
[im 56/56]
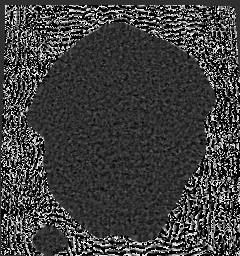

[Series 13: ax swi_swi · axial · 3.0mm · 0.90mm/px · z∈[-151,+8]mm · 5 of 56 slices shown]
[im 1/56]
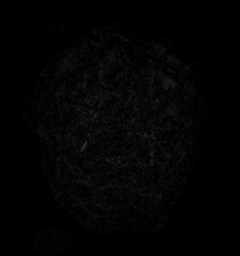
[im 14/56]
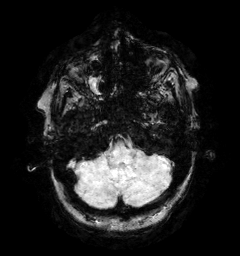
[im 28/56]
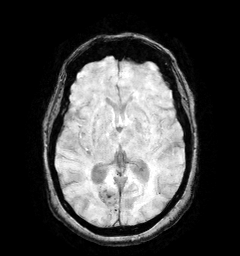
[im 42/56]
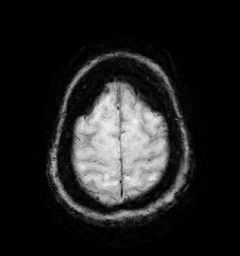
[im 56/56]
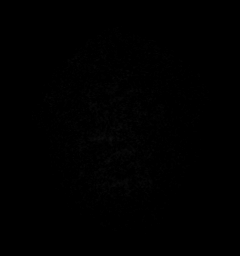

[Series 15: FLAIR · axial · 3.0mm · 0.69mm/px · z∈[-149,+7]mm · 5 of 54 slices shown]
[im 1/54]
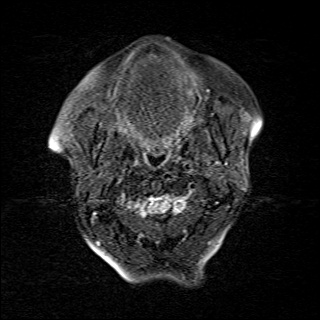
[im 14/54]
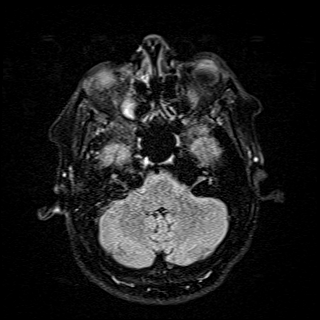
[im 27/54]
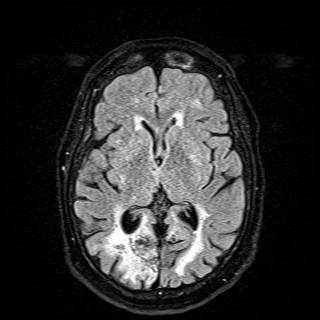
[im 40/54]
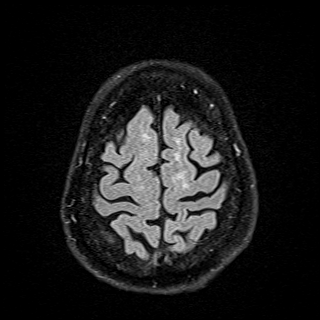
[im 54/54]
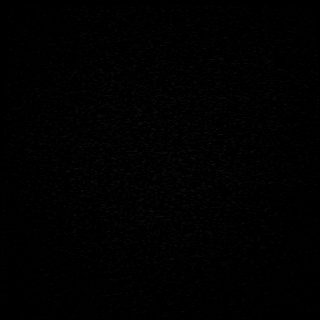

[Series 16: T1 · axial · 1.0mm · 0.98mm/px · z∈[-144,-6]mm · 12 of 144 slices shown (2 of 2)]
[im 1/144]
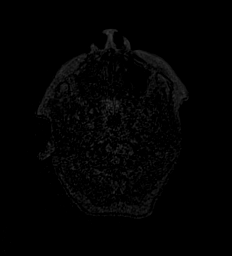
[im 14/144]
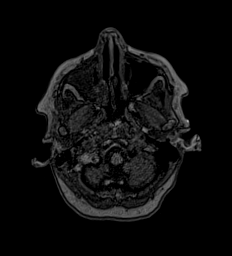
[im 27/144]
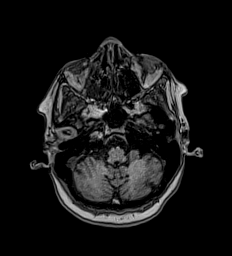
[im 40/144]
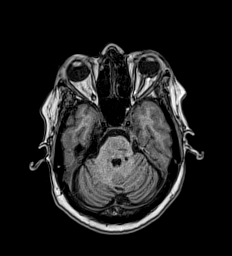
[im 53/144]
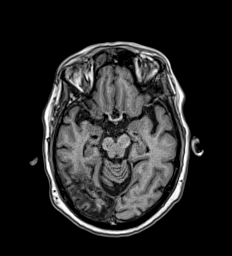
[im 66/144]
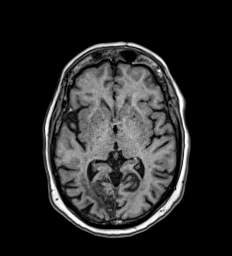
[im 79/144]
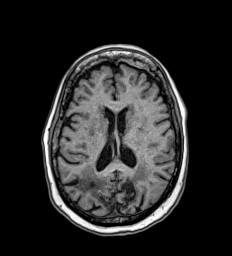
[im 92/144]
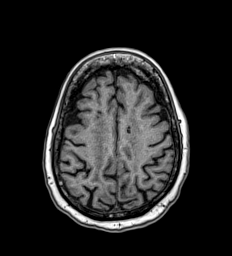
[im 105/144]
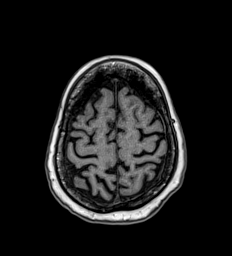
[im 118/144]
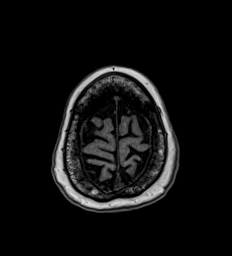
[im 131/144]
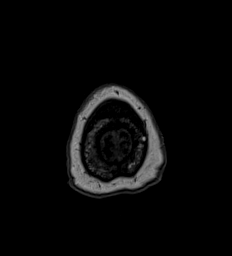
[im 144/144]
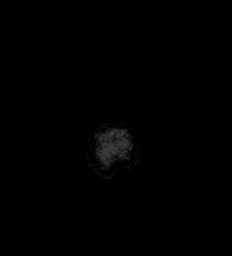

[Series 17: T2 · coronal · 5.0mm · 0.86mm/px · 2 of 27 slices shown (2 of 2)]
[im 1/27]
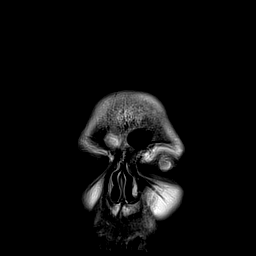
[im 27/27]
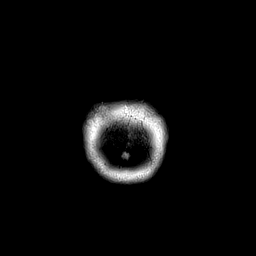

[48 of 48 positions shown; findings below may reference images not displayed]

FINDINGS: Brain: No acute infarct, mass effect or extra-axial collection. No
acute or chronic hemorrhage. Hyperintense T2-weighted signal is
moderately widespread throughout the white matter. Parenchymal
volume and CSF spaces are normal. Old bilateral PCA territory
infarcts. There are old small vessel infarcts of the centrum
semiovale. The midline structures are normal.

Vascular: Major flow voids are preserved.

Skull and upper cervical spine: Normal calvarium and skull base.
Visualized upper cervical spine and soft tissues are normal.

Sinuses/Orbits:Right maxillary sinus opacification.  Normal orbits.
IMPRESSION: 1. No acute intracranial abnormality.
2. Old bilateral PCA territory infarcts and findings of chronic
ischemic microangiopathy.

## 2021-07-29 IMAGING — CT CT HEAD W/O CM
4 series · 16 of 47 positions shown, 18 images · non-contrast
Comparison: None.

CLINICAL DATA: Difficulty moving foot x2 weeks with left lower
extremity cold to touch and no pulse.



[Series 2: head wo · axial · 0.39mm/px · z∈[-109,+11]mm · 7 of 32 slices shown, 9 images]
[im 4/32  brain]
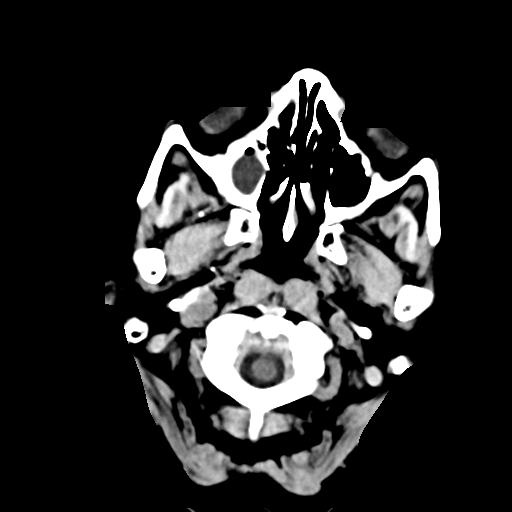
[im 4/32  bone]
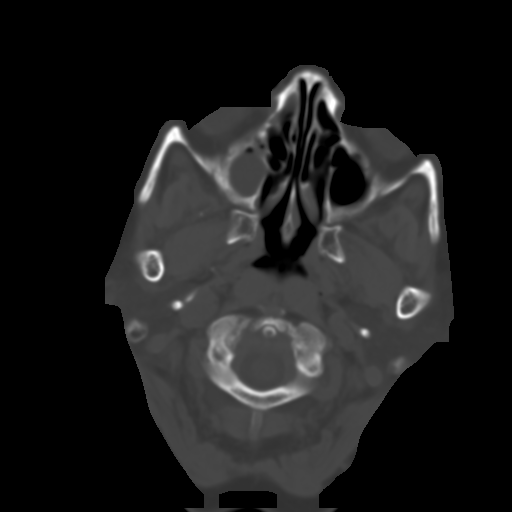
[im 8/32  brain]
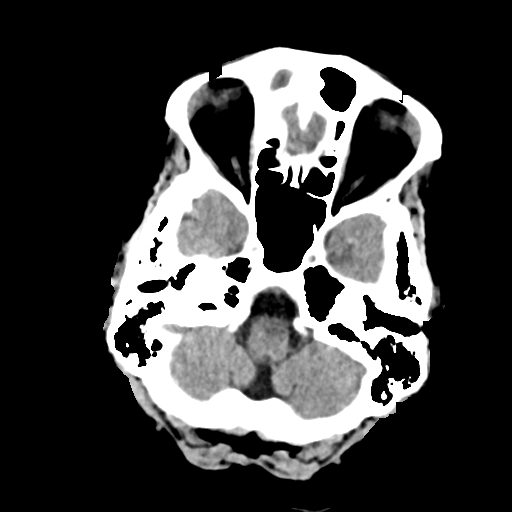
[im 12/32  brain]
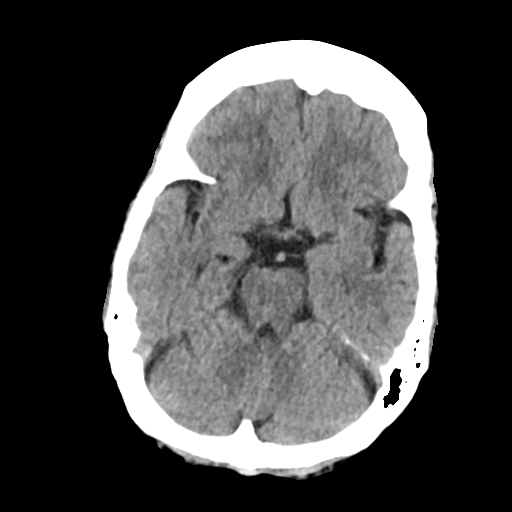
[im 16/32  brain]
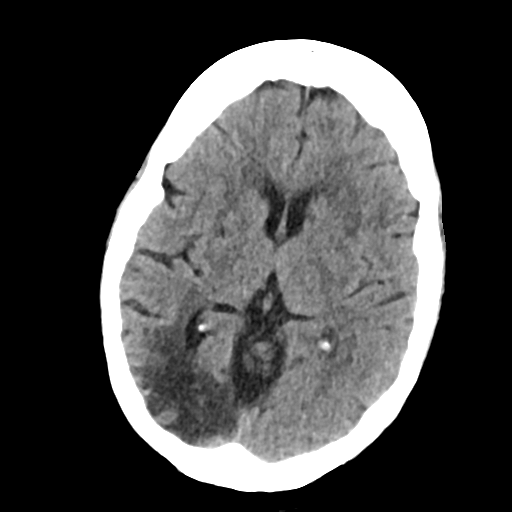
[im 20/32  brain]
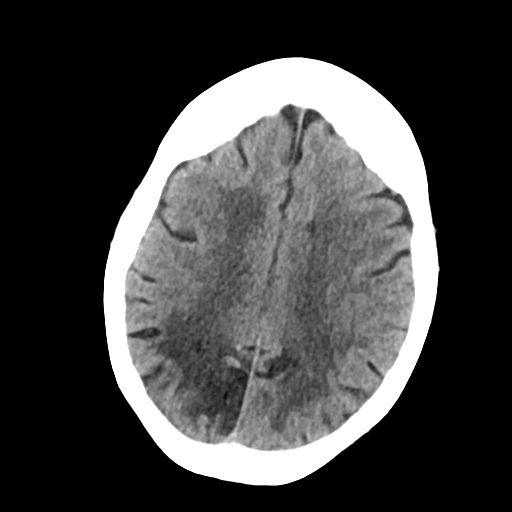
[im 20/32  bone]
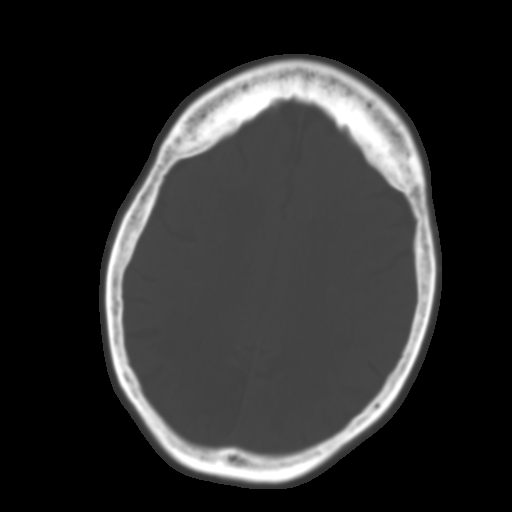
[im 24/32  brain]
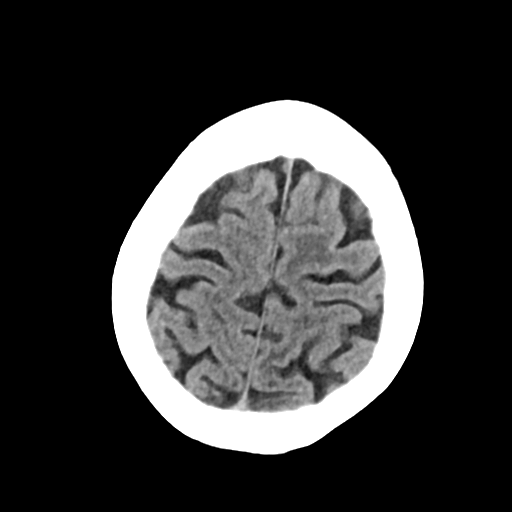
[im 28/32  brain]
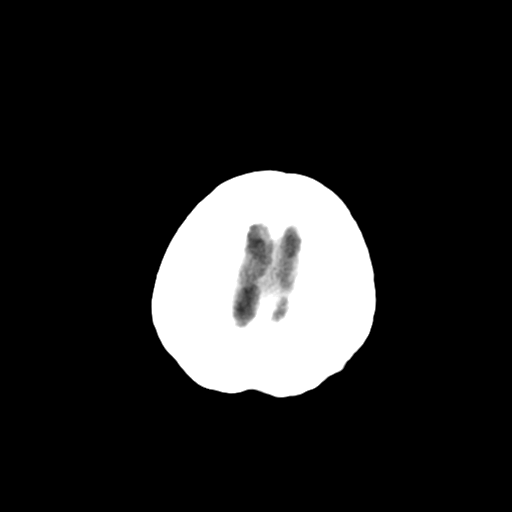

[Series 3: head bone · axial · 0.39mm/px · z∈[-110,-78]mm · 3 of 80 slices shown]
[im 8/80  bone]
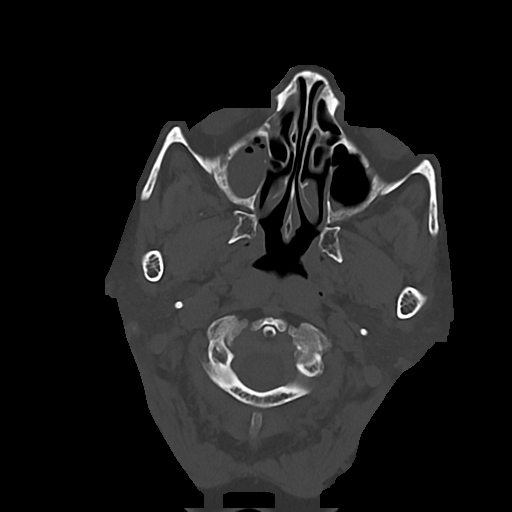
[im 16/80  bone]
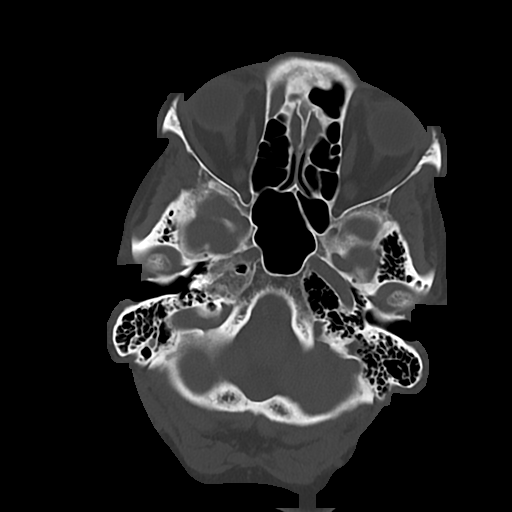
[im 24/80  bone]
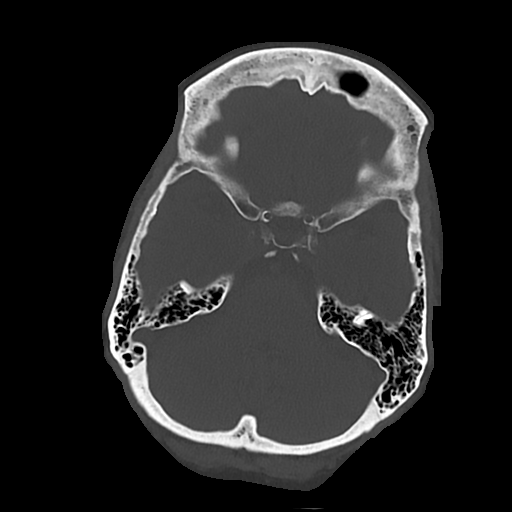

[Series 4: cor soft · coronal · 0.32mm/px · 3 of 64 slices shown]
[im 22/64  brain]
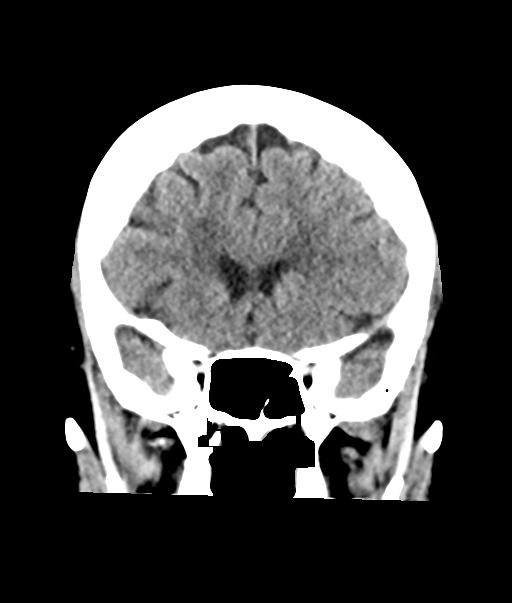
[im 29/64  brain]
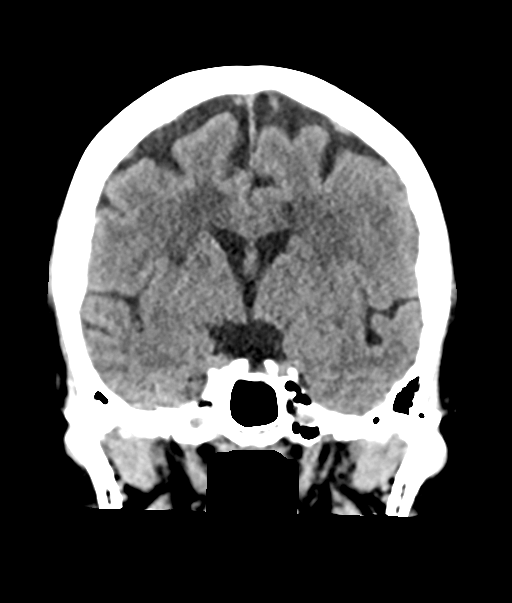
[im 36/64  brain]
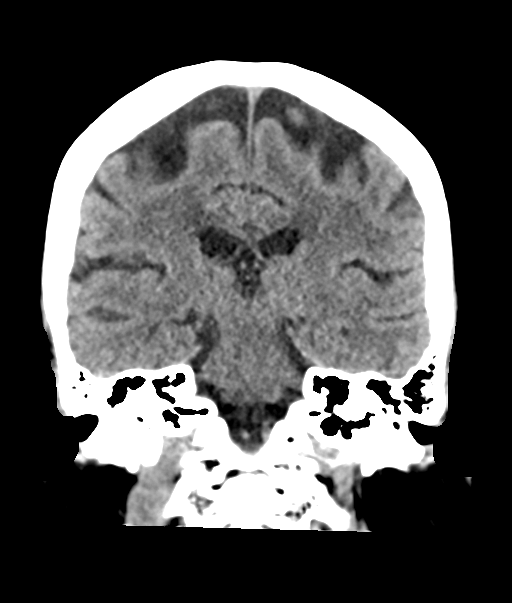

[Series 5: sag soft · sagittal · 0.35mm/px · 3 of 58 slices shown]
[im 20/58  brain]
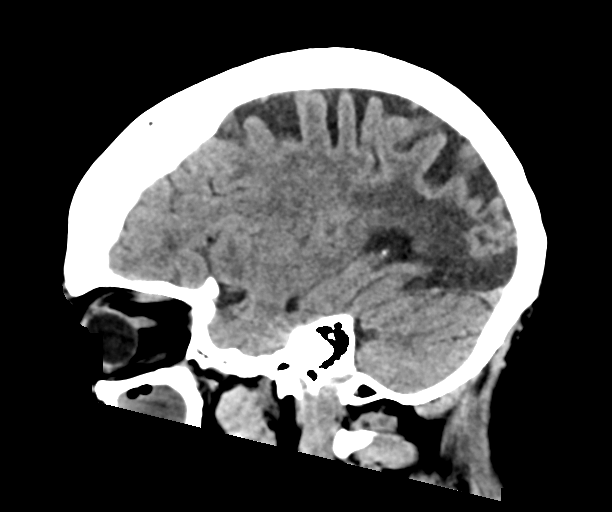
[im 29/58  brain]
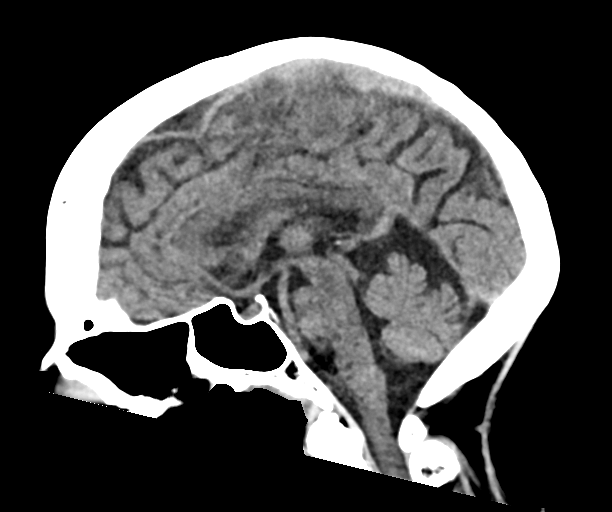
[im 39/58  brain]
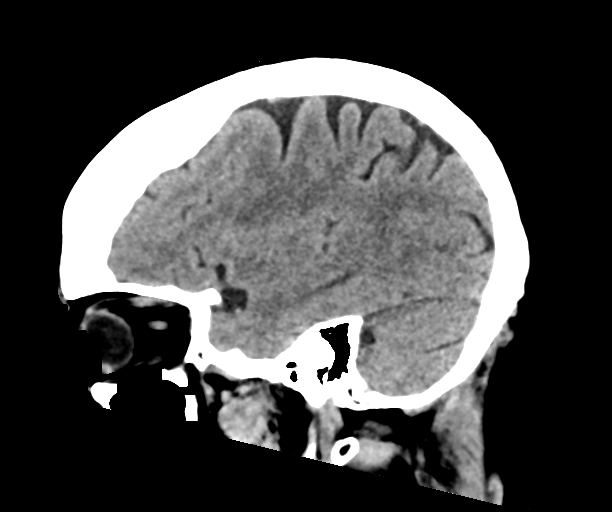

[16 of 47 positions shown; findings below may reference images not displayed]

FINDINGS: Brain: There is mild cerebral atrophy with widening of the
extra-axial spaces and ventricular dilatation.
There are areas of decreased attenuation within the white matter
tracts of the supratentorial brain, consistent with microvascular
disease changes.

Stable areas of encephalomalacia are seen within the bilateral
parietooccipital regions.

Vascular: No hyperdense vessel or unexpected calcification.

Skull: Normal. Negative for fracture or focal lesion.

Sinuses/Orbits: There is marked severity right maxillary sinus,
anterior right ethmoid sinus and right-sided frontal sinus mucosal
thickening.

Other: None.
IMPRESSION: 1. No acute intracranial abnormality.
2. Stable areas of encephalomalacia within the bilateral
parietooccipital regions.
3. Right-sided paranasal sinus disease.

## 2021-07-29 IMAGING — DX DG CHEST 1V PORT
1 series · 1 of 1 positions shown · non-contrast
Comparison: [DATE]

CLINICAL DATA: 70-year-old female with a history cold extremity

EXAM:
PORTABLE CHEST 1 VIEW

[chest ap]
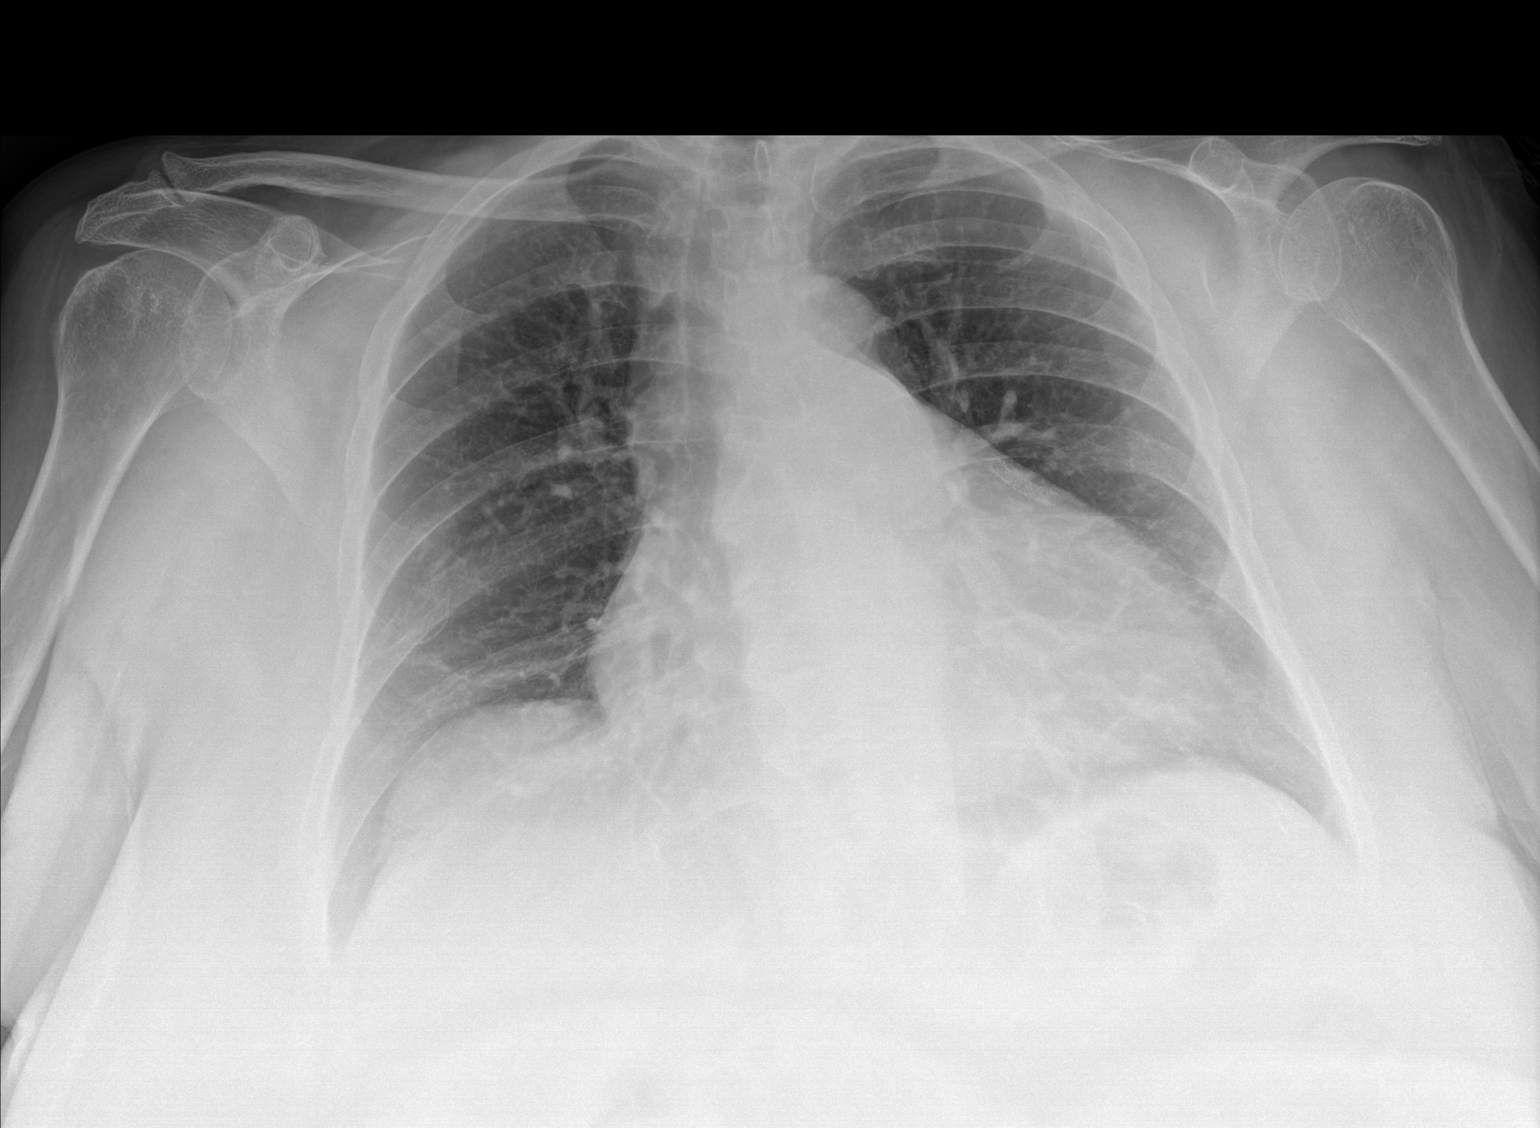

[1 of 1 positions shown; findings below may reference images not displayed]

FINDINGS: Cardiomediastinal silhouette unchanged in size and contour. No
evidence of central vascular congestion. No interlobular septal
thickening.

No pneumothorax or pleural effusion. Coarsened interstitial
markings, with no confluent airspace disease.

No acute displaced fracture.
IMPRESSION: Cardiomegaly, with no evidence of acute cardiopulmonary disease

## 2021-07-29 IMAGING — US US EXTREM LOW VENOUS*L*
1 series · 13 of 24 positions shown · non-contrast
Comparison: None.

CLINICAL DATA: Left leg pain and swelling for 2 weeks



[Series 1: us venous img lower uni left (dvt) · portal-venous · 13 of 34 slices shown]
[im 1/34]
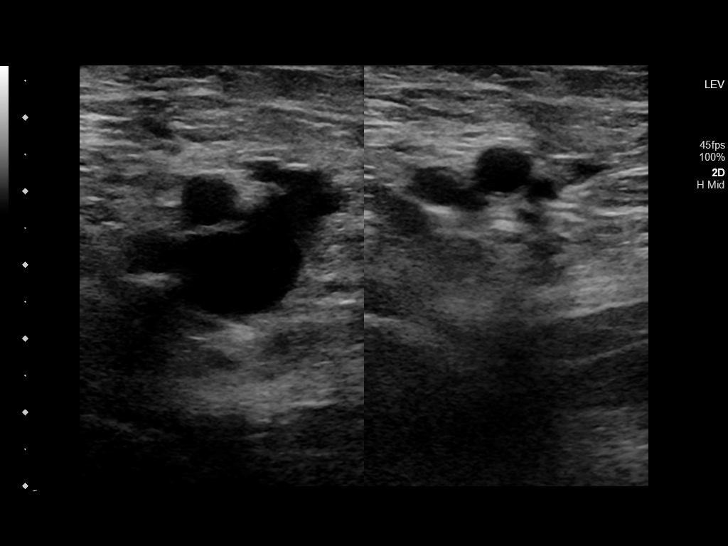
[im 3/34]
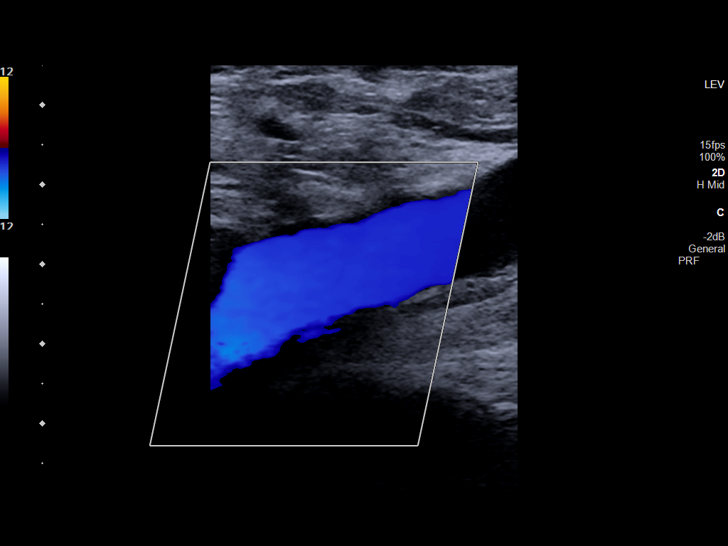
[im 6/34]
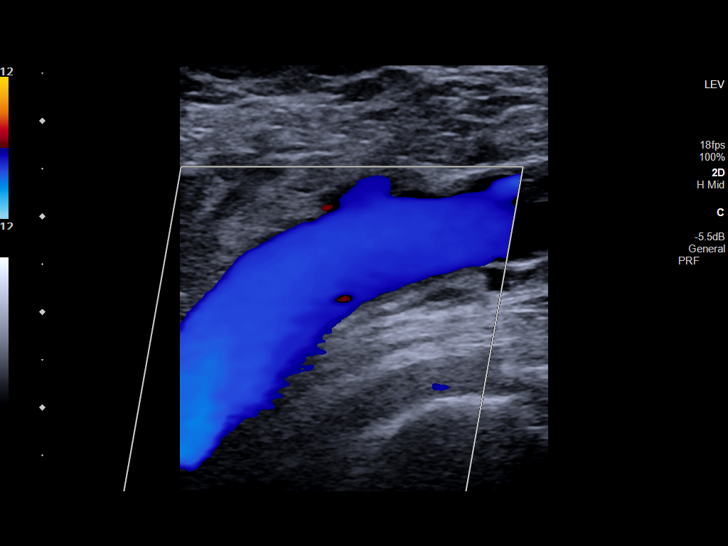
[im 9/34]
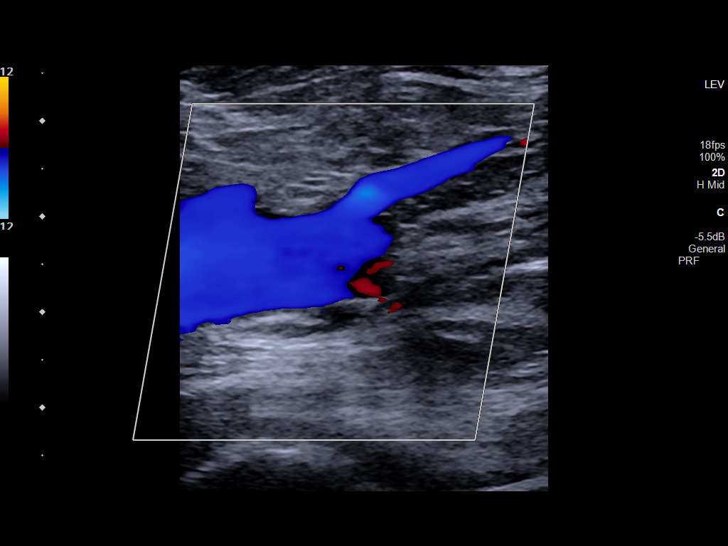
[im 12/34]
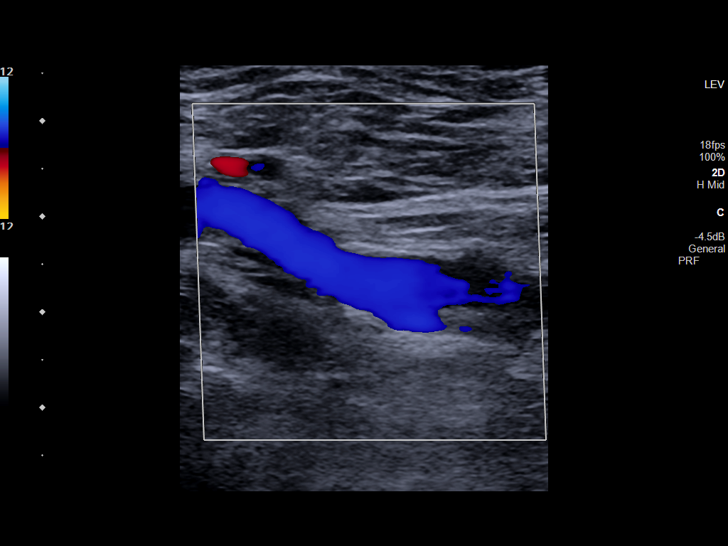
[im 15/34]
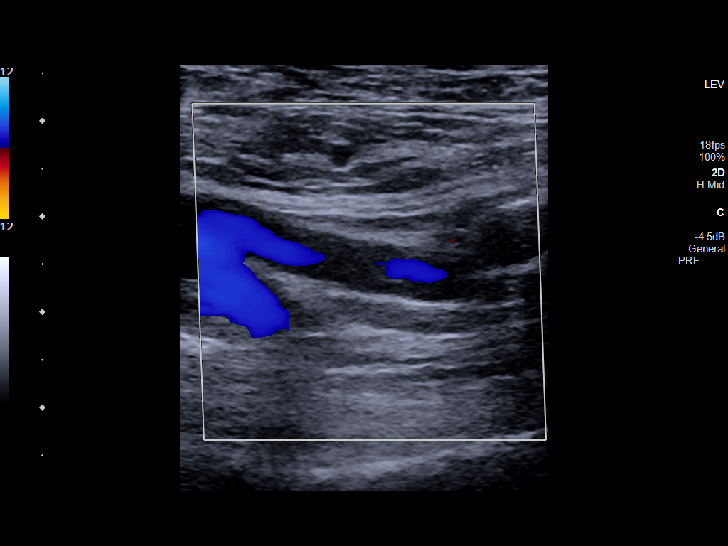
[im 18/34]
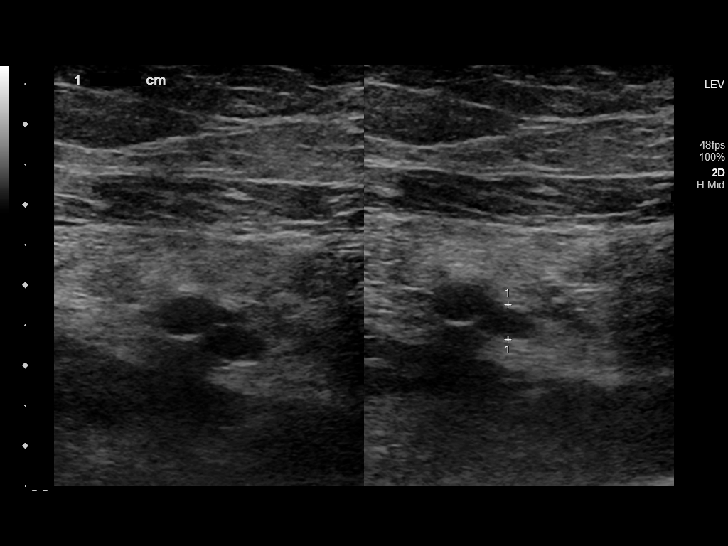
[im 19/34]
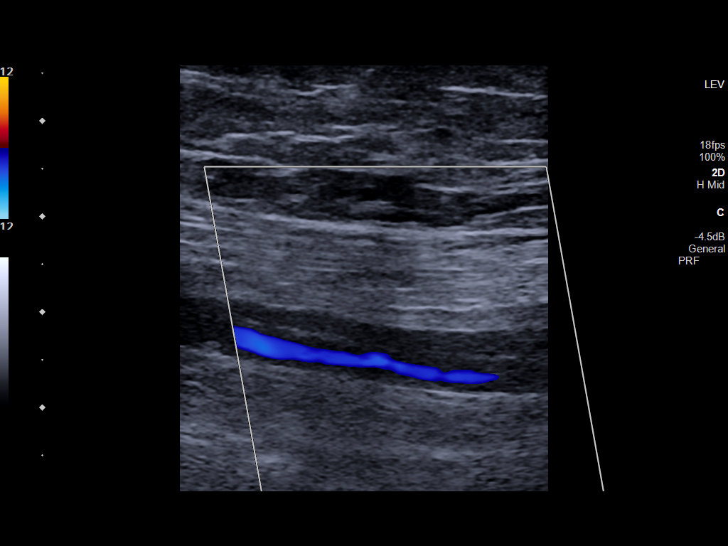
[im 22/34]
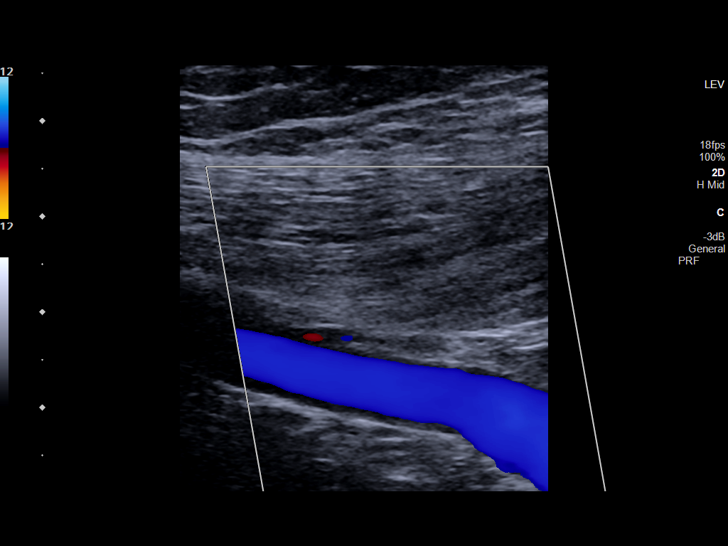
[im 25/34]
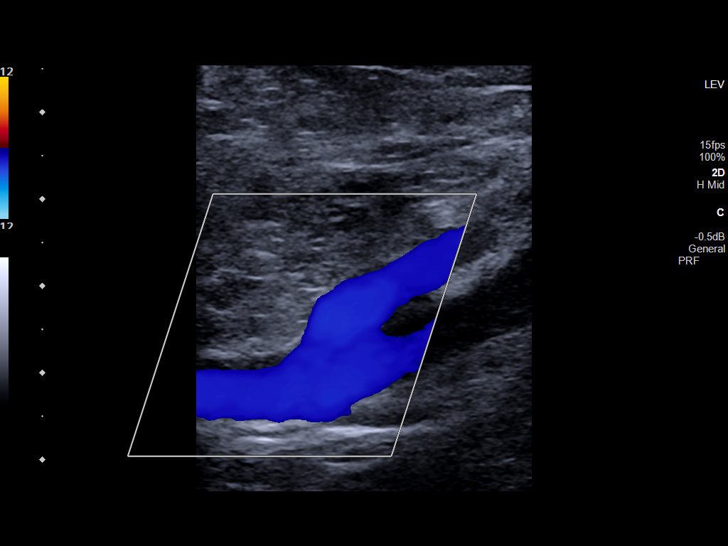
[im 28/34]
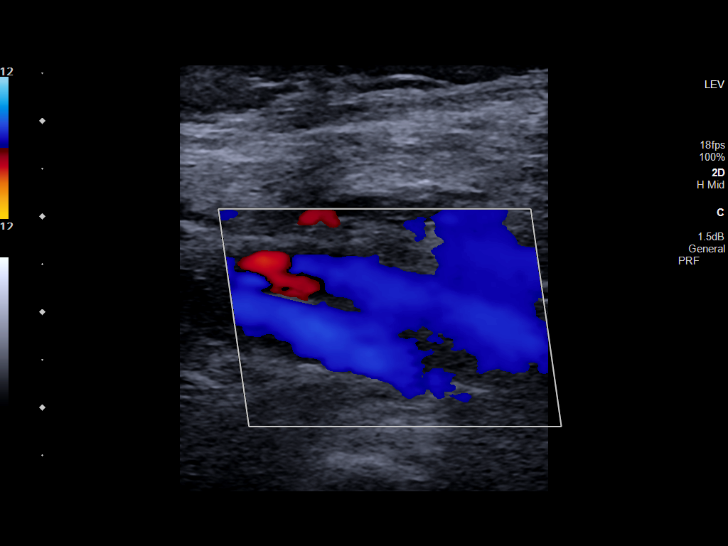
[im 31/34]
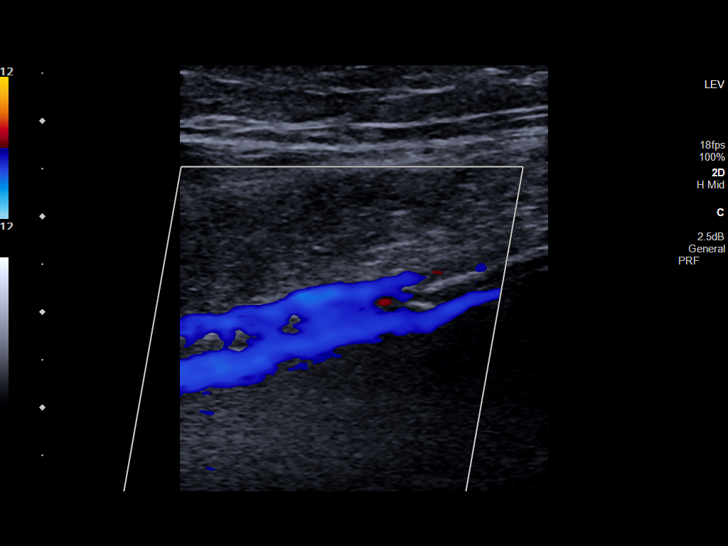
[im 34/34]
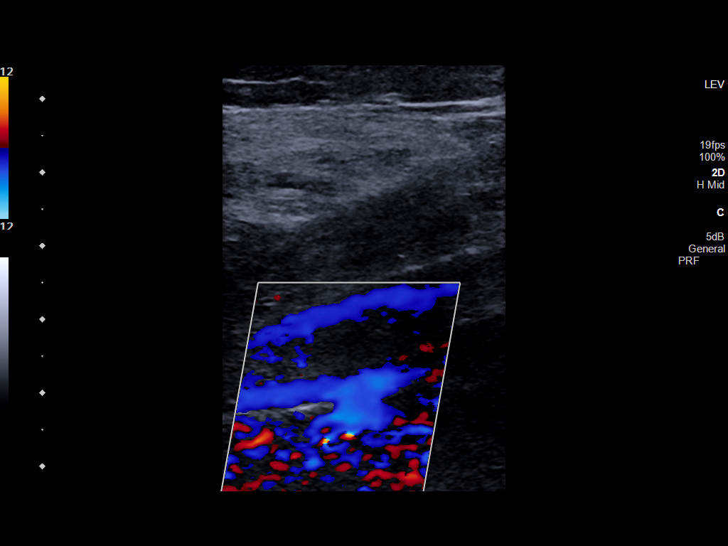

[13 of 24 positions shown; findings below may reference images not displayed]

FINDINGS: Contralateral Common Femoral Vein: Respiratory phasicity is normal
and symmetric with the symptomatic side. No evidence of thrombus.
Normal compressibility.

Common Femoral Vein: No evidence of thrombus. Normal
compressibility, respiratory phasicity and response to augmentation.

Saphenofemoral Junction: No evidence of thrombus. Normal
compressibility and flow on color Doppler imaging.

Profunda Femoral Vein: No evidence of thrombus. Normal
compressibility and flow on color Doppler imaging.

Femoral Vein: There is nonocclusive thrombus within the proximal and
mid left femoral vein, extending to the junction with the profundus
femoral vein.

Popliteal Vein: No evidence of thrombus. Normal compressibility,
respiratory phasicity and response to augmentation.

Calf Veins: No evidence of thrombus. Normal compressibility and flow
on color Doppler imaging.

Superficial Great Saphenous Vein: No evidence of thrombus. Normal
compressibility.

Other Findings:  None.
IMPRESSION: 1. Nonocclusive deep venous thrombosis involving the proximal and
mid left femoral vein.

Critical Value/emergent results were called by telephone at the time
of interpretation on [DATE] at [DATE] to provider LOSANO
LOSANO , who verbally acknowledged these results.

## 2021-07-29 NOTE — Patient Instructions (Addendum)
Proceed to emergency department for further assessment and treatment. ? ?Referral to Nephrology for CKD stage 4 ? ?[facility] number to call for primary care provider is [phone] ? ?Medication Instructions:  ?No changes at this time. ? ?*If you need a refill on your cardiac medications before your next appointment, please call your pharmacy* ? ? ?Lab Work: ?None ? ?If you have labs (blood work) drawn today and your tests are completely normal, you will receive your results only by: ?MyChart Message (if you have MyChart) OR ?A paper copy in the mail ?If you have any lab test that is abnormal or we need to change your treatment, we will call you to review the results. ? ? ?Testing/Procedures: ?None ? ? ?Follow-Up: ?At [facility], you and your health needs are our priority.  As part of our continuing mission to provide you with exceptional heart care, we have created designated Provider Care Teams.  These Care Teams include your primary Cardiologist (physician) and Advanced Practice Providers (APPs -  Physician Assistants and Nurse Practitioners) who all work together to provide you with the care you need, when you need it. ? ?We recommend signing up for the patient portal called "MyChart".  Sign up information is provided on this After Visit Summary.  MyChart is used to connect with patients for Virtual Visits (Telemedicine).  Patients are able to view lab/test results, encounter notes, upcoming appointments, etc.  Non-urgent messages can be sent to your provider as well.   ?To learn more about what you can do with MyChart, go to [email or web address].   ? ?Your next appointment:   ?1 month(s) ? ?The format for your next appointment:   ?In Person ? ?Provider:   ?Nelva Bush, MD or Christell Faith, PA-C ?

## 2021-07-29 NOTE — ED Notes (Signed)
Pt gave verbal consent to DC ° °

## 2021-07-29 NOTE — ED Triage Notes (Signed)
Left Lower extremity cold to touch and no pulse.  Arrives c/o difficulty moving foot x 2 weeks.  Per report stopped taking eliquis one month ago. ?

## 2021-07-29 NOTE — ED Provider Notes (Signed)
? ?[facility] ?Provider Note ? ? ? Event Date/Time  ? First MD Initiated Contact with Patient 07/29/21 1503   ?  (approximate) ? ? ?History  ? ?No chief complaint on file. ? ? ?HPI ? ?Susan Mclean is a 71 y.o. female resents from cardiology clinic due to reported swelling of the left leg reported cyanosis.  When asked about cyanosis she points to an area of varicose vein.  States that the symptoms have been ongoing for several weeks.  Noted to be hypertensive states that she been compliant with her medications as well as taking her Eliquis as prescribed.  She adamantly denies any chest pain or shortness of breath.  Denies any headaches.  Feels like she is having worse left foot drop in previous.  Denies any nausea or vomiting. ?  ? ? ?Physical Exam  ? ?Triage Vital Signs: ?ED Triage Vitals  ?Enc Vitals Group  ?   BP 07/29/21 1454 (!) 218/78  ?   Pulse Rate 07/29/21 1454 (!) 52  ?   Resp 07/29/21 1454 18  ?   Temp 07/29/21 1454 98 ?F (36.7 ?C)  ?   Temp Source 07/29/21 1454 Oral  ?   SpO2 07/29/21 1454 100 %  ?   Weight 07/29/21 1449 192 lb 0.3 oz (87.1 kg)  ?   Height 07/29/21 1449 5\' 1"  (1.549 m)  ?   Head Circumference --   ?   Peak Flow --   ?   Pain Score 07/29/21 1449 0  ?   Pain Loc --   ?   Pain Edu? --   ?   Excl. in [place]? --   ? ? ?Most recent vital signs: ?Vitals:  ? 07/29/21 1700 07/29/21 1730  ?BP: (!) 189/87 (!) 205/91  ?Pulse: (!) 51 (!) 49  ?Resp: 17   ?Temp:    ?SpO2: 100% 100%  ? ? ? ?Constitutional: Alert  ?Eyes: Conjunctivae are normal.  ?Head: Atraumatic. ?Nose: No congestion/rhinnorhea. ?Mouth/Throat: Mucous membranes are moist.   ?Neck: Painless ROM.  ?Cardiovascular:   Good peripheral circulation. No m/g/r.  Bilat lower extremity with cap refill 3sec.  No palpable pulse but biphasic doppler signals present to dp, pt bilat ?Respiratory: Normal respiratory effort.  No retractions.  ?Gastrointestinal: Soft and nontender.  ?Musculoskeletal:  no deformity ?Neurologic:  MAE  spontaneously. No gross focal neurologic deficits are appreciated.  ?Skin:  Skin is warm, dry and intact. No rash noted. ?Psychiatric: Mood and affect are normal. Speech and behavior are normal. ? ? ? ?ED Results / Procedures / Treatments  ? ?Labs ?(all labs ordered are listed, but only abnormal results are displayed) ?Labs Reviewed  ?CBC - Abnormal; Notable for the following components:  ?    Result Value  ? Hemoglobin 11.7 (*)   ? Platelets 123 (*)   ? All other components within normal limits  ?COMPREHENSIVE METABOLIC PANEL - Abnormal; Notable for the following components:  ? Chloride 117 (*)   ? CO2 19 (*)   ? Glucose, Bld 122 (*)   ? BUN 34 (*)   ? Creatinine, Ser 2.25 (*)   ? Calcium 8.8 (*)   ? Alkaline Phosphatase 167 (*)   ? GFR, Estimated 23 (*)   ? All other components within normal limits  ?LACTIC ACID, PLASMA  ?TROPONIN I (HIGH SENSITIVITY)  ?TROPONIN I (HIGH SENSITIVITY)  ? ? ? ?EKG ?ED ECG REPORT ?Tyrone Nine, the attending physician, personally viewed and interpreted this  ECG. ? ? Date: 07/29/2021 ? EKG Time: 14:58 ? Rate: 50 ? Rhythm: sinus ? Axis: normal ? Intervals: normal intervals, ? ST&T Change: no stemi, no depression,nonspecific twave abn unchanged from previous ? ? ? ? ?RADIOLOGY ?Please see ED Course for my review and interpretation. ? ?I personally reviewed all radiographic images ordered to evaluate for the above acute complaints and reviewed radiology reports and findings.  These findings were personally discussed with the patient.  Please see medical record for radiology report. ? ? ? ?PROCEDURES: ? ?Critical Care performed: No ? ?Procedures ? ? ?MEDICATIONS ORDERED IN ED: ?Medications - No data to display ? ? ?IMPRESSION / MDM / ASSESSMENT AND PLAN / ED COURSE  ?I reviewed the triage vital signs and the nursing notes. ?             ?               ? ?Differential diagnosis includes, but is not limited to, DVT coda claudication, CHF, cellulitis, ischemic limb, musculoskeletal  strain ? ?Patient presenting with symptoms as described above.  Has been having more than 1 week of left lower leg pain and calf discomfort.  Denies any injury.  She is on Eliquis and has been compliant with this.  Will be sent for the blood differential she denies any chest pain or shortness of breath no back pain. ? ? ? ? ?Clinical Course as of 07/29/21 2044  ?Fri Jul 29, 2021  ?1533 On assessment with Doppler patient has biphasic PT and DP pulses of bilateral lower extremities.  Both feet are warm.  Does have some pain with palpation of the left calf.  We will order duplex to make sure he does not have DVT feeling management with oral anticoagulation. [PR]  ?[id number] Per radiology report ultrasound there is nonocclusive DVT in the left femoral vein which could explain some of her swelling.  She does not clinically have cerulea dolens.  Her lower extremity is well-perfused at this time.  CT head without acute intracranial abnormality. [PR]  ?2040 MRI without evidence of acute stroke.  Patient denies any pain or discomfort.  Troponin negative.  She denies any chest pain or pressure.  Patient states that she is anxious to go home.  Will be given referral to vascular surgery.  I do not believe she requires hospitalization at this time. [PR]  ?  ?Clinical Course User Index ?[PR] Merlyn Lot, MD  ? ? ? ?FINAL CLINICAL IMPRESSION(S) / ED DIAGNOSES  ? ?Final diagnoses:  ?DVT of deep femoral vein, left ([place])  ? ? ? ?Rx / DC Orders  ? ?ED Discharge Orders   ? ? None  ? ?  ? ? ? ?Note:  This document was prepared using Dragon voice recognition software and may include unintentional dictation errors. ? ?  ?Merlyn Lot, MD ?07/29/21 2044 ? ?

## 2021-08-05 ENCOUNTER — Ambulatory Visit (INDEPENDENT_AMBULATORY_CARE_PROVIDER_SITE_OTHER): Payer: 59 | Admitting: Nurse Practitioner

## 2021-08-05 ENCOUNTER — Encounter (INDEPENDENT_AMBULATORY_CARE_PROVIDER_SITE_OTHER): Payer: Self-pay | Admitting: Nurse Practitioner

## 2021-08-05 VITALS — BP 152/79 | HR 52 | Resp 17 | Ht 61.0 in | Wt 193.0 lb

## 2021-08-05 DIAGNOSIS — I10 Essential (primary) hypertension: Secondary | ICD-10-CM | POA: Diagnosis not present

## 2021-08-05 DIAGNOSIS — M79605 Pain in left leg: Secondary | ICD-10-CM | POA: Diagnosis not present

## 2021-08-05 DIAGNOSIS — Z7901 Long term (current) use of anticoagulants: Secondary | ICD-10-CM

## 2021-08-07 ENCOUNTER — Encounter (INDEPENDENT_AMBULATORY_CARE_PROVIDER_SITE_OTHER): Payer: Self-pay | Admitting: Nurse Practitioner

## 2021-08-07 NOTE — Progress Notes (Signed)
? ?Subjective:  ? ? Patient ID: Susan Mclean, female    DOB: Aug 20, 1950, 71 y.o.   MRN: [id number] ?No chief complaint on file. ? ? ?Susan Mclean is a 71 year old female that presents today after recent visit to the emergency room due to left lower extremity leg pain.  The patient's leg is mobile and and it was cool to touch.  The leg was also cyanotic at the time.  They obtained a DVT study which showed a femoral DVT.  It was reported as an acute thrombus however the patient has been on Eliquis for the last 3 years due to a pulmonary embolism.  The patient notes that she has not missed any medication at any point in time.  Further manual review of the images show that the DVT is chronic in nature and based upon the remodeling in the vein it appears that it has been chronic for some time.  The patient notes that the pain that sent her to the emergency room has subsided.  Her left lower extremity still is larger than the right but that has been ongoing for years she notes. ? ? ?Review of Systems  ?Cardiovascular:  Positive for leg swelling.  ?All other systems reviewed and are negative. ? ?   ?Objective:  ? Physical Exam ?Vitals reviewed.  ?HENT:  ?   Head: Normocephalic.  ?Cardiovascular:  ?   Rate and Rhythm: Normal rate.  ?   Pulses:     ?     Dorsalis pedis pulses are 0 on the left side.  ?     Posterior tibial pulses are 0 on the left side.  ?Pulmonary:  ?   Effort: Pulmonary effort is normal.  ?Musculoskeletal:  ?   Left lower leg: Edema present.  ?Skin: ?   General: Skin is dry.  ?   Comments: Left leg cooler Then right  ?Neurological:  ?   Mental Status: She is alert and oriented to person, place, and time.  ?Psychiatric:     ?   Mood and Affect: Mood normal.     ?   Behavior: Behavior normal.     ?   Thought Content: Thought content normal.     ?   Judgment: Judgment normal.  ? ? ?BP (!) 152/79 (BP Location: Left Arm)   Pulse (!) 52   Resp 17   Ht 5\' 1"  (1.549 m)   Wt 193 lb (87.5 kg)   BMI 36.47 kg/m?   ? ?Past Medical History:  ?Diagnosis Date  ? Atrial fibrillation ([place])   ? CHF (congestive heart failure) ([place])   ? HTN (hypertension)   ? Hypertension   ? MI (myocardial infarction) ([place])   ? ? ?Social History  ? ?Socioeconomic History  ? Marital status: Unknown  ?  Spouse name: Not on file  ? Number of children: Not on file  ? Years of education: Not on file  ? Highest education level: Not on file  ?Occupational History  ? Not on file  ?Tobacco Use  ? Smoking status: Never  ? Smokeless tobacco: Never  ?Substance and Sexual Activity  ? Alcohol use: Not Currently  ? Drug use: Never  ? Sexual activity: Not on file  ?Other Topics Concern  ? Not on file  ?Social History Narrative  ? Not on file  ? ?Social Determinants of Health  ? ?Financial Resource Strain: Not on file  ?Food Insecurity: Not on file  ?Transportation Needs: Not on file  ?Physical  Activity: Not on file  ?Stress: Not on file  ?Social Connections: Not on file  ?Intimate Partner Violence: Not on file  ? ? ?Past Surgical History:  ?Procedure Laterality Date  ? CORONARY STENT INTERVENTION N/A 03/09/2021  ? Procedure: CORONARY STENT INTERVENTION;  Surgeon: Nelva Bush, MD;  Location: [facility];  Service: Cardiovascular;  Laterality: N/A;  ? CORONARY STENT PLACEMENT    ? LEFT HEART CATH AND CORONARY ANGIOGRAPHY N/A 03/09/2021  ? Procedure: LEFT HEART CATH AND CORONARY ANGIOGRAPHY;  Surgeon: Nelva Bush, MD;  Location: [facility];  Service: Cardiovascular;  Laterality: N/A;  ? ? ?History reviewed. No pertinent family history. ? ?No Known Allergies ? ? ?  Latest Ref Rng & Units 07/29/2021  ?  2:58 PM 03/12/2021  ?  7:57 AM 03/11/2021  ?  4:27 AM  ?CBC  ?WBC 4.0 - 10.5 K/uL 4.7   4.8   6.1    ?Hemoglobin 12.0 - 15.0 g/dL 11.7   10.5   9.4    ?Hematocrit 36.0 - 46.0 % 36.9   31.4   28.4    ?Platelets 150 - 400 K/uL 123   173   147    ? ? ? ? ?CMP  ?   ?Component Value Date/Time  ? NA 143 07/29/2021 1458  ? K 4.5 07/29/2021 1458  ? CL 117  (H) 07/29/2021 1458  ? CO2 19 (L) 07/29/2021 1458  ? GLUCOSE 122 (H) 07/29/2021 1458  ? BUN 34 (H) 07/29/2021 1458  ? CREATININE 2.25 (H) 07/29/2021 1458  ? CALCIUM 8.8 (L) 07/29/2021 1458  ? PROT 8.1 07/29/2021 1458  ? ALBUMIN 3.6 07/29/2021 1458  ? AST 15 07/29/2021 1458  ? ALT 11 07/29/2021 1458  ? ALKPHOS 167 (H) 07/29/2021 1458  ? BILITOT 0.9 07/29/2021 1458  ? GFRNONAA 23 (L) 07/29/2021 1458  ? ? ? ?No results found. ? ?   ?Assessment & Plan:  ? ?1. Pain of left lower extremity ?I am concerned that the patient's pain was not a result of the DVT that was detected, as this was chronic but as a result of possible underlying peripheral arterial disease.  Some of her symptoms are concerning for rest pain.  We will have the patient follow-up within the next week for ABIs with possible intervention if necessary. ? ?2. Chronic anticoagulation ?Upon review of the ultrasound that was done in the hospital her DVT is chronic.  This is more consistent with her being on Eliquis for the last 3 years and not missing any doses.  Patient should continue on anticoagulation as previously prescribed. ? ?3. Primary hypertension ?Continue antihypertensive medications as already ordered, these medications have been reviewed and there are no changes at this time.  ? ? ?Current Outpatient Medications on File Prior to Visit  ?Medication Sig Dispense Refill  ? apixaban (ELIQUIS) 5 MG TABS tablet Take 1 tablet (5 mg total) by mouth 2 (two) times daily. 60 tablet 3  ? atorvastatin (LIPITOR) 80 MG tablet Take 1 tablet (80 mg total) by mouth daily. 30 tablet 3  ? carvedilol (COREG) 6.25 MG tablet Take 1 tablet (6.25 mg total) by mouth 2 (two) times daily with a meal. 60 tablet 3  ? clopidogrel (PLAVIX) 75 MG tablet Take 1 tablet (75 mg total) by mouth daily with breakfast. 30 tablet 3  ? nitroGLYCERIN (NITROSTAT) 0.4 MG SL tablet Place 1 tablet (0.4 mg total) under the tongue every 5 (five) minutes as needed for chest  pain. 20 tablet 12   ? ?No current facility-administered medications on file prior to visit.  ? ? ?There are no Patient Instructions on file for this visit. ?No follow-ups on file. ? ? ?Kris Hartmann, NP ? ? ?

## 2021-08-16 ENCOUNTER — Other Ambulatory Visit (INDEPENDENT_AMBULATORY_CARE_PROVIDER_SITE_OTHER): Payer: Self-pay | Admitting: Nurse Practitioner

## 2021-08-16 DIAGNOSIS — M79605 Pain in left leg: Secondary | ICD-10-CM

## 2021-08-17 ENCOUNTER — Encounter (INDEPENDENT_AMBULATORY_CARE_PROVIDER_SITE_OTHER): Payer: 59

## 2021-08-17 ENCOUNTER — Ambulatory Visit (INDEPENDENT_AMBULATORY_CARE_PROVIDER_SITE_OTHER): Payer: 59 | Admitting: Nurse Practitioner

## 2021-09-01 ENCOUNTER — Ambulatory Visit: Payer: 59 | Admitting: Internal Medicine

## 2021-09-01 ENCOUNTER — Encounter: Payer: Self-pay | Admitting: Internal Medicine

## 2021-09-01 NOTE — Progress Notes (Deleted)
Follow-up Outpatient Visit Date: 09/01/2021  Primary Care Provider: Pcp, No No address on file  Chief Complaint: ***  HPI:  Ms. Susan Mclean is a 71 y.o. female with history of CAD remote LAD stenting and MI medically managed in the setting of prolonged hospitalization with intubation in [place] in 2021 with NSTEMI in 03/2021 status post PCI/DES to the distal LCx and OM3, HFpEF, PAF, CVA, HTN, HLD, DM2, CKD stage IV-V previously requiring temporary dialysis during extended hospitalization in [place] in 2021, anemia, vision disturbance of uncertain etiology, and obesity, who presents for follow-up of coronary artery disease and DVT.  She was last seen in our office in late March, which time she had several complaints including sporadic nonexertional chest pain and weakness/coldness,/pain involving the left lower extremity.  She noted some palpitations and remained on carvedilol and apixaban.  She was not taking amiodarone anymore.  Pedal pulses were nonpalpable in the left foot.  There was concern for compromise of arterial flow.  She was referred to the ED for further evaluation.  Nonocclusive DVT was seen in the left femoral vein.  She was referred to vascular surgery: It was felt that DVT in the left femoral vein was likely chronic she was referred for ABIs to exclude concurrent PAD.  --------------------------------------------------------------------------------------------------  Past Medical History:  Diagnosis Date   Atrial fibrillation ([facility])    CHF (congestive heart failure) ([place])    DVT (deep venous thrombosis) ([facility])    Left lower extremity   HTN (hypertension)    Hypertension    MI (myocardial infarction) ([place])    Past Surgical History:  Procedure Laterality Date   CORONARY STENT INTERVENTION N/A 03/09/2021   Procedure: CORONARY STENT INTERVENTION;  Surgeon: Nelva Bush, MD;  Location: [facility];  Service: Cardiovascular;  Laterality: N/A;   CORONARY STENT PLACEMENT      LEFT HEART CATH AND CORONARY ANGIOGRAPHY N/A 03/09/2021   Procedure: LEFT HEART CATH AND CORONARY ANGIOGRAPHY;  Surgeon: Nelva Bush, MD;  Location: [facility];  Service: Cardiovascular;  Laterality: N/A;    No outpatient medications have been marked as taking for the 09/01/21 encounter (Appointment) with Collie Kittel, Harrell Gave, MD.    Allergies: Patient has no known allergies.  Social History   Tobacco Use   Smoking status: Never   Smokeless tobacco: Never  Substance Use Topics   Alcohol use: Not Currently   Drug use: Never    No family history on file.  Review of Systems: A 12-system review of systems was performed and was negative except as noted in the HPI.  --------------------------------------------------------------------------------------------------  Physical Exam: There were no vitals taken for this visit.  General:  NAD. Neck: No JVD or HJR. Lungs: Clear to auscultation bilaterally without wheezes or crackles. Heart: Regular rate and rhythm without murmurs, rubs, or gallops. Abdomen: Soft, nontender, nondistended. Extremities: No lower extremity edema.  EKG:  ***  Lab Results  Component Value Date   WBC 4.7 07/29/2021   HGB 11.7 (L) 07/29/2021   HCT 36.9 07/29/2021   MCV 87.0 07/29/2021   PLT 123 (L) 07/29/2021    Lab Results  Component Value Date   NA 143 07/29/2021   K 4.5 07/29/2021   CL 117 (H) 07/29/2021   CO2 19 (L) 07/29/2021   BUN 34 (H) 07/29/2021   CREATININE 2.25 (H) 07/29/2021   GLUCOSE 122 (H) 07/29/2021   ALT 11 07/29/2021    Lab Results  Component Value Date   CHOL 105 03/10/2021  HDL 36 (L) 03/10/2021   LDLCALC 60 03/10/2021   TRIG 45 03/10/2021   CHOLHDL 2.9 03/10/2021    --------------------------------------------------------------------------------------------------  ASSESSMENT AND PLAN: Nelva Bush, MD 09/01/2021 7:46 AM

## 2021-09-02 ENCOUNTER — Emergency Department: Payer: 59

## 2021-09-02 ENCOUNTER — Other Ambulatory Visit: Payer: Self-pay

## 2021-09-02 ENCOUNTER — Emergency Department
Admission: EM | Admit: 2021-09-02 | Discharge: 2021-09-03 | Disposition: A | Discharge status: Other Acute Inpatient Hospital | Payer: 59 | Attending: Emergency Medicine | Admitting: Emergency Medicine

## 2021-09-02 DIAGNOSIS — J9690 Respiratory failure, unspecified, unspecified whether with hypoxia or hypercapnia: Secondary | ICD-10-CM | POA: Diagnosis not present

## 2021-09-02 DIAGNOSIS — G40901 Epilepsy, unspecified, not intractable, with status epilepticus: Secondary | ICD-10-CM | POA: Diagnosis not present

## 2021-09-02 DIAGNOSIS — R7309 Other abnormal glucose: Secondary | ICD-10-CM | POA: Diagnosis not present

## 2021-09-02 DIAGNOSIS — I110 Hypertensive heart disease with heart failure: Secondary | ICD-10-CM | POA: Insufficient documentation

## 2021-09-02 DIAGNOSIS — Z20822 Contact with and (suspected) exposure to covid-19: Secondary | ICD-10-CM | POA: Diagnosis not present

## 2021-09-02 DIAGNOSIS — I4891 Unspecified atrial fibrillation: Secondary | ICD-10-CM | POA: Insufficient documentation

## 2021-09-02 DIAGNOSIS — G40909 Epilepsy, unspecified, not intractable, without status epilepticus: Secondary | ICD-10-CM | POA: Insufficient documentation

## 2021-09-02 DIAGNOSIS — Z7901 Long term (current) use of anticoagulants: Secondary | ICD-10-CM | POA: Diagnosis not present

## 2021-09-02 DIAGNOSIS — I509 Heart failure, unspecified: Secondary | ICD-10-CM | POA: Insufficient documentation

## 2021-09-02 DIAGNOSIS — R4182 Altered mental status, unspecified: Secondary | ICD-10-CM | POA: Diagnosis present

## 2021-09-02 LAB — CBG MONITORING, ED: Glucose-Capillary: 199 mg/dL — ABNORMAL HIGH (ref 70–99)

## 2021-09-02 MED ORDER — ROCURONIUM BROMIDE 10 MG/ML (PF) SYRINGE
PREFILLED_SYRINGE | INTRAVENOUS | Status: AC
  Filled 2021-09-02: qty 10

## 2021-09-02 MED ORDER — KETAMINE HCL 50 MG/5ML IJ SOSY
PREFILLED_SYRINGE | INTRAMUSCULAR | Status: AC
  Filled 2021-09-02: qty 5

## 2021-09-02 MED ORDER — FENTANYL CITRATE PF 50 MCG/ML IJ SOSY
PREFILLED_SYRINGE | INTRAMUSCULAR | Status: AC
  Filled 2021-09-02: qty 2

## 2021-09-02 MED ORDER — PROPOFOL 1000 MG/100ML IV EMUL
INTRAVENOUS | Status: AC
  Administered 2021-09-02: 30 ug/kg/min via INTRAVENOUS
  Filled 2021-09-02: qty 100

## 2021-09-02 MED ORDER — MIDAZOLAM HCL 2 MG/2ML IJ SOLN
INTRAMUSCULAR | Status: AC
  Filled 2021-09-02: qty 2

## 2021-09-02 MED ORDER — SUCCINYLCHOLINE CHLORIDE 200 MG/10ML IV SOSY
PREFILLED_SYRINGE | INTRAVENOUS | Status: AC
  Administered 2021-09-02: 150 mg via INTRAVENOUS
  Filled 2021-09-02: qty 10

## 2021-09-02 MED ORDER — ETOMIDATE 2 MG/ML IV SOLN
INTRAVENOUS | Status: AC
  Administered 2021-09-02: 30 mg via INTRAVENOUS
  Filled 2021-09-02: qty 20

## 2021-09-02 NOTE — ED Notes (Signed)
Pt transported to CT on monitor with RT and RN ?

## 2021-09-02 NOTE — ED Notes (Signed)
Pt arrived to room 6, Dr. Alfred Levins, RT, ED Tech, and RN x 3 at the bedside ?

## 2021-09-03 ENCOUNTER — Emergency Department: Payer: 59

## 2021-09-03 ENCOUNTER — Inpatient Hospital Stay (HOSPITAL_COMMUNITY)
Admission: EM | Admit: 2021-09-03 | Discharge: 2021-09-12 | DRG: 100 | Disposition: A | Discharge status: Skilled Nursing Facility | Payer: 59 | Attending: Internal Medicine | Admitting: Internal Medicine

## 2021-09-03 ENCOUNTER — Inpatient Hospital Stay (HOSPITAL_COMMUNITY): Payer: 59

## 2021-09-03 ENCOUNTER — Other Ambulatory Visit: Payer: 59

## 2021-09-03 DIAGNOSIS — E785 Hyperlipidemia, unspecified: Secondary | ICD-10-CM | POA: Diagnosis present

## 2021-09-03 DIAGNOSIS — H5500 Unspecified nystagmus: Secondary | ICD-10-CM | POA: Diagnosis present

## 2021-09-03 DIAGNOSIS — Z794 Long term (current) use of insulin: Secondary | ICD-10-CM | POA: Diagnosis not present

## 2021-09-03 DIAGNOSIS — J9690 Respiratory failure, unspecified, unspecified whether with hypoxia or hypercapnia: Secondary | ICD-10-CM

## 2021-09-03 DIAGNOSIS — G9389 Other specified disorders of brain: Secondary | ICD-10-CM | POA: Diagnosis present

## 2021-09-03 DIAGNOSIS — I5032 Chronic diastolic (congestive) heart failure: Secondary | ICD-10-CM | POA: Diagnosis present

## 2021-09-03 DIAGNOSIS — D696 Thrombocytopenia, unspecified: Secondary | ICD-10-CM | POA: Diagnosis present

## 2021-09-03 DIAGNOSIS — R471 Dysarthria and anarthria: Secondary | ICD-10-CM | POA: Diagnosis present

## 2021-09-03 DIAGNOSIS — G40909 Epilepsy, unspecified, not intractable, without status epilepticus: Secondary | ICD-10-CM | POA: Diagnosis not present

## 2021-09-03 DIAGNOSIS — I480 Paroxysmal atrial fibrillation: Secondary | ICD-10-CM | POA: Diagnosis present

## 2021-09-03 DIAGNOSIS — Z7901 Long term (current) use of anticoagulants: Secondary | ICD-10-CM

## 2021-09-03 DIAGNOSIS — N184 Chronic kidney disease, stage 4 (severe): Secondary | ICD-10-CM | POA: Diagnosis present

## 2021-09-03 DIAGNOSIS — Z7902 Long term (current) use of antithrombotics/antiplatelets: Secondary | ICD-10-CM

## 2021-09-03 DIAGNOSIS — E8721 Acute metabolic acidosis: Secondary | ICD-10-CM | POA: Diagnosis present

## 2021-09-03 DIAGNOSIS — Z8673 Personal history of transient ischemic attack (TIA), and cerebral infarction without residual deficits: Secondary | ICD-10-CM | POA: Diagnosis not present

## 2021-09-03 DIAGNOSIS — G40901 Epilepsy, unspecified, not intractable, with status epilepticus: Principal | ICD-10-CM

## 2021-09-03 DIAGNOSIS — E1122 Type 2 diabetes mellitus with diabetic chronic kidney disease: Secondary | ICD-10-CM | POA: Diagnosis present

## 2021-09-03 DIAGNOSIS — I2510 Atherosclerotic heart disease of native coronary artery without angina pectoris: Secondary | ICD-10-CM | POA: Diagnosis present

## 2021-09-03 DIAGNOSIS — Z955 Presence of coronary angioplasty implant and graft: Secondary | ICD-10-CM

## 2021-09-03 DIAGNOSIS — Z79899 Other long term (current) drug therapy: Secondary | ICD-10-CM

## 2021-09-03 DIAGNOSIS — J9601 Acute respiratory failure with hypoxia: Secondary | ICD-10-CM | POA: Diagnosis present

## 2021-09-03 DIAGNOSIS — I252 Old myocardial infarction: Secondary | ICD-10-CM | POA: Diagnosis not present

## 2021-09-03 DIAGNOSIS — Z86718 Personal history of other venous thrombosis and embolism: Secondary | ICD-10-CM | POA: Diagnosis not present

## 2021-09-03 DIAGNOSIS — R569 Unspecified convulsions: Secondary | ICD-10-CM | POA: Diagnosis present

## 2021-09-03 DIAGNOSIS — I130 Hypertensive heart and chronic kidney disease with heart failure and stage 1 through stage 4 chronic kidney disease, or unspecified chronic kidney disease: Secondary | ICD-10-CM | POA: Diagnosis present

## 2021-09-03 LAB — CBC
HCT: 29.2 % — ABNORMAL LOW (ref 36.0–46.0)
HCT: 38.9 % (ref 36.0–46.0)
Hemoglobin: 11.9 g/dL — ABNORMAL LOW (ref 12.0–15.0)
Hemoglobin: 9 g/dL — ABNORMAL LOW (ref 12.0–15.0)
MCH: 27.7 pg (ref 26.0–34.0)
MCH: 28.8 pg (ref 26.0–34.0)
MCHC: 30.6 g/dL (ref 30.0–36.0)
MCHC: 30.8 g/dL (ref 30.0–36.0)
MCV: 90.7 fL (ref 80.0–100.0)
MCV: 93.6 fL (ref 80.0–100.0)
Platelets: 145 10*3/uL — ABNORMAL LOW (ref 150–400)
Platelets: 85 10*3/uL — ABNORMAL LOW (ref 150–400)
RBC: 3.12 MIL/uL — ABNORMAL LOW (ref 3.87–5.11)
RBC: 4.29 MIL/uL (ref 3.87–5.11)
RDW: 14.7 % (ref 11.5–15.5)
RDW: 14.9 % (ref 11.5–15.5)
WBC: 10.2 10*3/uL (ref 4.0–10.5)
WBC: 5.5 10*3/uL (ref 4.0–10.5)
nRBC: 0 % (ref 0.0–0.2)
nRBC: 0 % (ref 0.0–0.2)

## 2021-09-03 LAB — BASIC METABOLIC PANEL
Anion gap: 6 (ref 5–15)
BUN: 27 mg/dL — ABNORMAL HIGH (ref 8–23)
CO2: 14 mmol/L — ABNORMAL LOW (ref 22–32)
Calcium: 8.7 mg/dL — ABNORMAL LOW (ref 8.9–10.3)
Chloride: 120 mmol/L — ABNORMAL HIGH (ref 98–111)
Creatinine, Ser: 2.01 mg/dL — ABNORMAL HIGH (ref 0.44–1.00)
GFR, Estimated: 26 mL/min — ABNORMAL LOW (ref >60)
Glucose, Bld: 115 mg/dL — ABNORMAL HIGH (ref 70–99)
Potassium: 5.2 mmol/L — ABNORMAL HIGH (ref 3.5–5.1)
Sodium: 140 mmol/L (ref 135–145)

## 2021-09-03 LAB — DIFFERENTIAL
Abs Immature Granulocytes: 0.05 10*3/uL (ref 0.00–0.07)
Basophils Absolute: 0.1 10*3/uL (ref 0.0–0.1)
Basophils Relative: 1 %
Eosinophils Absolute: 0.2 10*3/uL (ref 0.0–0.5)
Eosinophils Relative: 2 %
Immature Granulocytes: 1 %
Lymphocytes Relative: 56 %
Lymphs Abs: 5.9 10*3/uL — ABNORMAL HIGH (ref 0.7–4.0)
Monocytes Absolute: 0.7 10*3/uL (ref 0.1–1.0)
Monocytes Relative: 7 %
Neutro Abs: 3.4 10*3/uL (ref 1.7–7.7)
Neutrophils Relative %: 33 %
Smear Review: NORMAL

## 2021-09-03 LAB — URINALYSIS, ROUTINE W REFLEX MICROSCOPIC
Bilirubin Urine: NEGATIVE
Glucose, UA: NEGATIVE mg/dL
Ketones, ur: NEGATIVE mg/dL
Leukocytes,Ua: NEGATIVE
Nitrite: NEGATIVE
Protein, ur: 30 mg/dL — AB
Specific Gravity, Urine: 1.011 (ref 1.005–1.030)
pH: 5 (ref 5.0–8.0)

## 2021-09-03 LAB — RESP PANEL BY RT-PCR (FLU A&B, COVID) ARPGX2
Influenza A by PCR: NEGATIVE
Influenza B by PCR: NEGATIVE
SARS Coronavirus 2 by RT PCR: NEGATIVE

## 2021-09-03 LAB — POCT I-STAT 7, (LYTES, BLD GAS, ICA,H+H)
Acid-base deficit: 5 mmol/L — ABNORMAL HIGH (ref 0.0–2.0)
Bicarbonate: 19 mmol/L — ABNORMAL LOW (ref 20.0–28.0)
Calcium, Ion: 1.28 mmol/L (ref 1.15–1.40)
HCT: 27 % — ABNORMAL LOW (ref 36.0–46.0)
Hemoglobin: 9.2 g/dL — ABNORMAL LOW (ref 12.0–15.0)
O2 Saturation: 99 %
Patient temperature: 36.7
Potassium: 4.8 mmol/L (ref 3.5–5.1)
Sodium: 146 mmol/L — ABNORMAL HIGH (ref 135–145)
TCO2: 20 mmol/L — ABNORMAL LOW (ref 22–32)
pCO2 arterial: 32.1 mmHg (ref 32–48)
pH, Arterial: 7.38 (ref 7.35–7.45)
pO2, Arterial: 133 mmHg — ABNORMAL HIGH (ref 83–108)

## 2021-09-03 LAB — GLUCOSE, CAPILLARY
Glucose-Capillary: 129 mg/dL — ABNORMAL HIGH (ref 70–99)
Glucose-Capillary: 155 mg/dL — ABNORMAL HIGH (ref 70–99)
Glucose-Capillary: 168 mg/dL — ABNORMAL HIGH (ref 70–99)
Glucose-Capillary: 88 mg/dL (ref 70–99)
Glucose-Capillary: 95 mg/dL (ref 70–99)

## 2021-09-03 LAB — BLOOD GAS, ARTERIAL
Acid-base deficit: 14.4 mmol/L — ABNORMAL HIGH (ref 0.0–2.0)
Bicarbonate: 13.1 mmol/L — ABNORMAL LOW (ref 20.0–28.0)
FIO2: 50 %
MECHVT: 400 mL
O2 Saturation: 96.7 %
PEEP: 5 cmH2O
Patient temperature: 37
Spontaneous VT: 400 mL
pCO2 arterial: 36 mmHg (ref 32–48)
pH, Arterial: 7.17 — CL (ref 7.35–7.45)
pO2, Arterial: 128 mmHg — ABNORMAL HIGH (ref 83–108)

## 2021-09-03 LAB — URINE DRUG SCREEN, QUALITATIVE (ARMC ONLY)
Amphetamines, Ur Screen: NOT DETECTED
Barbiturates, Ur Screen: NOT DETECTED
Benzodiazepine, Ur Scrn: NOT DETECTED
Cannabinoid 50 Ng, Ur ~~LOC~~: NOT DETECTED
Cocaine Metabolite,Ur ~~LOC~~: NOT DETECTED
MDMA (Ecstasy)Ur Screen: NOT DETECTED
Methadone Scn, Ur: NOT DETECTED
Opiate, Ur Screen: NOT DETECTED
Phencyclidine (PCP) Ur S: NOT DETECTED
Tricyclic, Ur Screen: NOT DETECTED

## 2021-09-03 LAB — COMPREHENSIVE METABOLIC PANEL
ALT: 12 U/L (ref 0–44)
AST: 29 U/L (ref 15–41)
Albumin: 3.7 g/dL (ref 3.5–5.0)
Alkaline Phosphatase: 187 U/L — ABNORMAL HIGH (ref 38–126)
Anion gap: 12 (ref 5–15)
BUN: 31 mg/dL — ABNORMAL HIGH (ref 8–23)
CO2: 15 mmol/L — ABNORMAL LOW (ref 22–32)
Calcium: 9.2 mg/dL (ref 8.9–10.3)
Chloride: 114 mmol/L — ABNORMAL HIGH (ref 98–111)
Creatinine, Ser: 2.28 mg/dL — ABNORMAL HIGH (ref 0.44–1.00)
GFR, Estimated: 23 mL/min — ABNORMAL LOW (ref >60)
Glucose, Bld: 207 mg/dL — ABNORMAL HIGH (ref 70–99)
Potassium: 5 mmol/L (ref 3.5–5.1)
Sodium: 141 mmol/L (ref 135–145)
Total Bilirubin: 0.6 mg/dL (ref 0.3–1.2)
Total Protein: 8.2 g/dL — ABNORMAL HIGH (ref 6.5–8.1)

## 2021-09-03 LAB — CBG MONITORING, ED: Glucose-Capillary: 123 mg/dL — ABNORMAL HIGH (ref 70–99)

## 2021-09-03 LAB — HEMOGLOBIN A1C
Hgb A1c MFr Bld: 6 % — ABNORMAL HIGH (ref 4.8–5.6)
Mean Plasma Glucose: 125.5 mg/dL

## 2021-09-03 LAB — PROTIME-INR
INR: 1.2 (ref 0.8–1.2)
Prothrombin Time: 14.7 seconds (ref 11.4–15.2)

## 2021-09-03 LAB — ETHANOL: Alcohol, Ethyl (B): <10 mg/dL (ref <10)

## 2021-09-03 LAB — PHOSPHORUS: Phosphorus: 3.4 mg/dL (ref 2.5–4.6)

## 2021-09-03 LAB — LACTIC ACID, PLASMA
Lactic Acid, Venous: 1 mmol/L (ref 0.5–1.9)
Lactic Acid, Venous: 0.9 mmol/L (ref 0.5–1.9)

## 2021-09-03 LAB — MAGNESIUM: Magnesium: 1.8 mg/dL (ref 1.7–2.4)

## 2021-09-03 LAB — MRSA NEXT GEN BY PCR, NASAL: MRSA by PCR Next Gen: NOT DETECTED

## 2021-09-03 LAB — APTT: aPTT: 32 seconds (ref 24–36)

## 2021-09-03 IMAGING — CT CT VENOGRAM HEAD
1 of 7 series · 2 of 34 positions shown · IV contrast (APPLIED)
Comparison: No prior CTA or CT V, correlation is made with CT head
[DATE].

CLINICAL DATA: Unresponsive, stroke suspected

EXAM:
CT ANGIOGRAPHY HEAD AND NECK
CT VENOGRAM
TECHNIQUE: Multidetector CT imaging of the head and neck was performed using
the standard protocol during bolus administration of intravenous
contrast. Multiplanar CT image reconstructions and MIPs were
obtained to evaluate the vascular anatomy. Carotid stenosis
measurements (when applicable) are obtained utilizing NASCET
criteria, using the distal internal carotid diameter as the
denominator.

[Series 5: venogram · axial · 0.41mm/px · z∈[-84,-34]mm · 2 of 77 slices shown]
[im 26/77  soft-tissue]
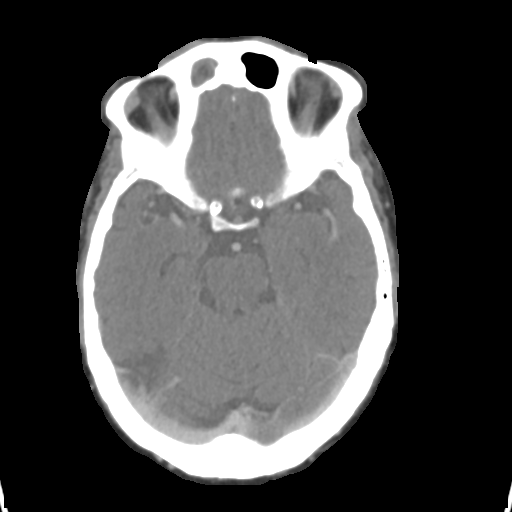
[im 51/77  bone]
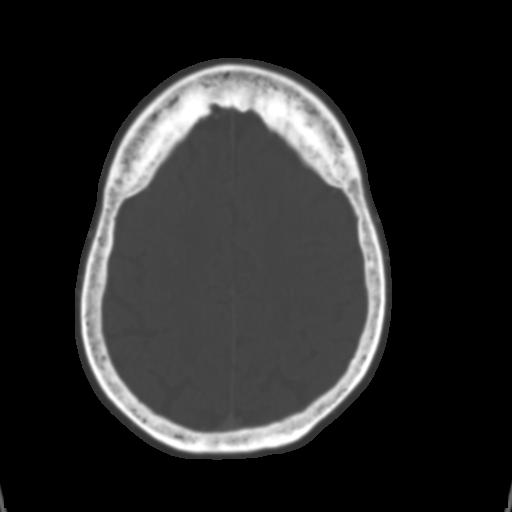

[2 of 34 positions shown; findings below may reference images not displayed]

Venographic phase images of the brain were obtained following the
administration of intravenous contrast. Multiplanar reformats and
maximum intensity projections were generated.

RADIATION DOSE REDUCTION: This exam was performed according to the
departmental dose-optimization program which includes automated
exposure control, adjustment of the mA and/or kV according to
patient size and/or use of iterative reconstruction technique.

CONTRAST:  75mL OMNIPAQUE IOHEXOL 350 MG/ML SOLN
FINDINGS: CT HEAD FINDINGS

For noncontrast findings, please see same day CT head.

CTA NECK FINDINGS

Aortic arch: Standard branching. Imaged portion shows no evidence of
aneurysm or dissection. No significant stenosis of the major arch
vessel origins.

Right carotid system: No evidence of dissection, occlusion, or
hemodynamically significant stenosis (greater than 50%).

Left carotid system: No evidence of dissection, occlusion, or
hemodynamically significant stenosis (greater than 50%).

Vertebral arteries: Left dominant. No evidence of dissection,
occlusion, or hemodynamically significant stenosis (greater than
50%).

Skeleton: No acute osseous abnormality.

Other neck: Endotracheal and orogastric tubes noted.

Upper chest: Focal pulmonary opacity or pleural effusion. The main
pulmonary artery measures up to 3.0 cm, just above the upper limit
of normal.

Review of the MIP images confirms the above findings

CTA HEAD FINDINGS

Anterior circulation: Both internal carotid arteries are patent to
the termini, with calcifications and moderate stenosis in the
bilateral cavernous and supraclinoid segments.

A1 segments patent. Normal anterior communicating artery. Anterior
cerebral arteries are patent to their distal aspects.

No M1 stenosis or occlusion. Normal MCA bifurcations. Distal MCA
branches perfused and symmetric.

Posterior circulation: Vertebral arteries patent to the
vertebrobasilar junction without stenosis. Posterior inferior
cerebral arteries patent bilaterally.

Basilar patent to its distal aspect. Superior cerebellar arteries
patent bilaterally.

Short segment severe stenosis or occlusion in the distal right
P1/proximal right P2 (series 5, images 95 and 96). The right PCA is
otherwise diminutive but patent with multifocal mild irregularity.
Patent left P1. The left PCA demonstrates multifocal mild narrowing
(series 5, image 99 and 100) but is perfused to its distal aspects.
The bilateral posterior communicating arteries are not visualized.

Venous sinuses: As permitted by contrast timing, patent.

Anatomic variants: None significant.

Review of the MIP images confirms the above findings

CTV FINDINGS

Superior sagittal sinus: Normal.

Straight sinus: Normal.

Inferior sagittal sinus, vein of INGELIS and internal cerebral veins:
Normal.

Transverse sinuses: Hypoplastic left transverse sinus is patent.
Normal right transverse sinus.

Sigmoid sinuses: Hypoplastic left sigmoid sinus is patent. Normal
right sigmoid sinus.

Visualized jugular veins: Normal
IMPRESSION: 1. Short segment severe stenosis or occlusion in the distal right
P1/proximal right P2, of indeterminate acuity given the patient's
history of prior right PCA territory.
2. Moderate stenosis in the bilateral cavernous and supraclinoid
segments.
3.  No hemodynamically significant stenosis in the neck.
4. No evidence of dural venous sinus stenosis or thrombosis.
5. Mild enlargement of the main pulmonary artery, as can be seen
with in the setting of pulmonary hypertension.

## 2021-09-03 IMAGING — CT CT HEAD W/O CM
4 series · 16 of 47 positions shown, 18 images · non-contrast
Comparison: [DATE]

CLINICAL DATA: Unresponsive stroke suspected



[Series 2: head wo · axial · 0.42mm/px · z∈[-173,-53]mm · 7 of 32 slices shown, 9 images]
[im 4/32  brain]
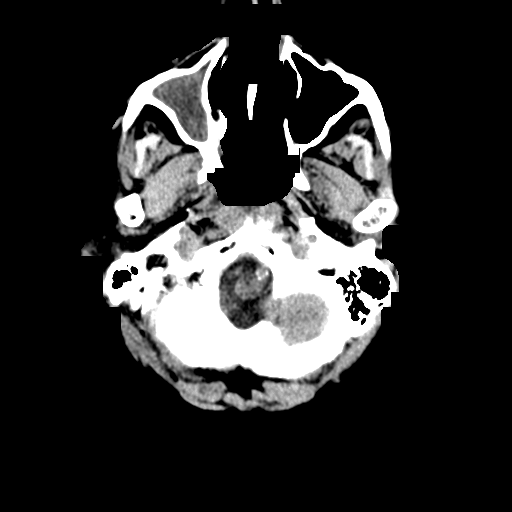
[im 4/32  bone]
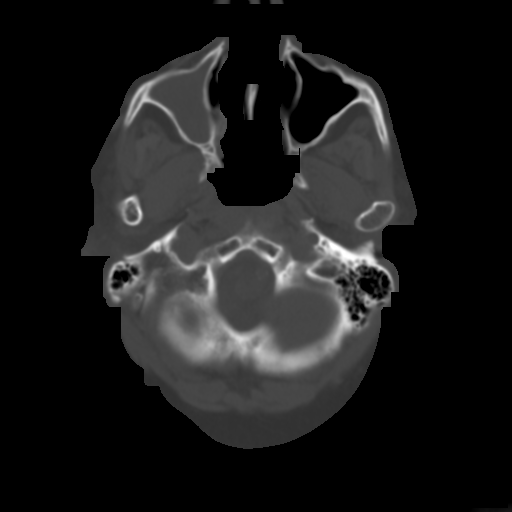
[im 8/32  brain]
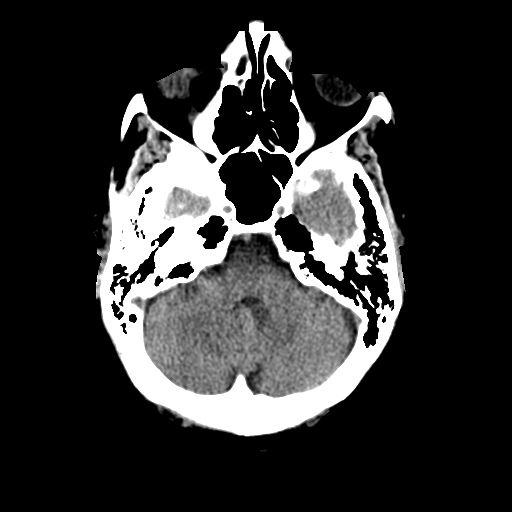
[im 12/32  brain]
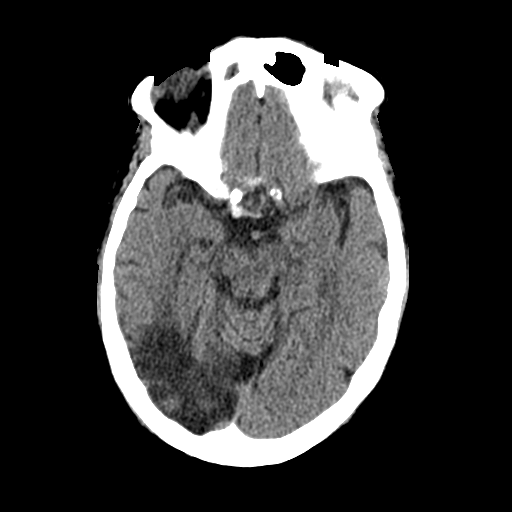
[im 16/32  brain]
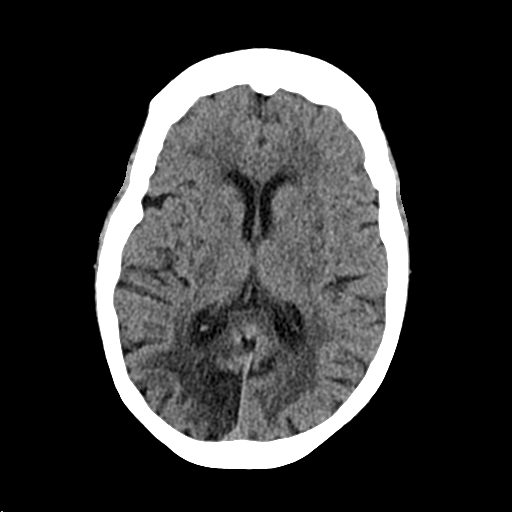
[im 20/32  brain]
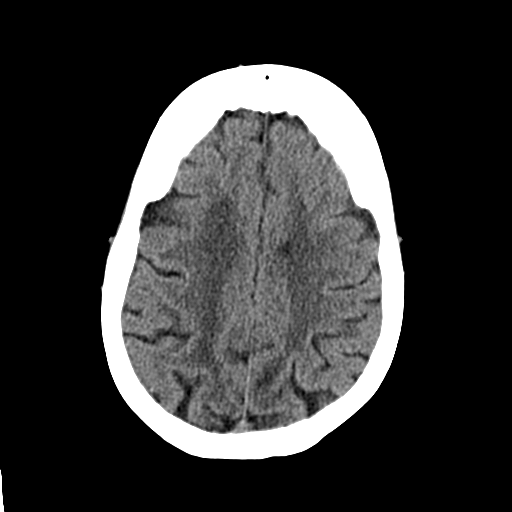
[im 20/32  bone]
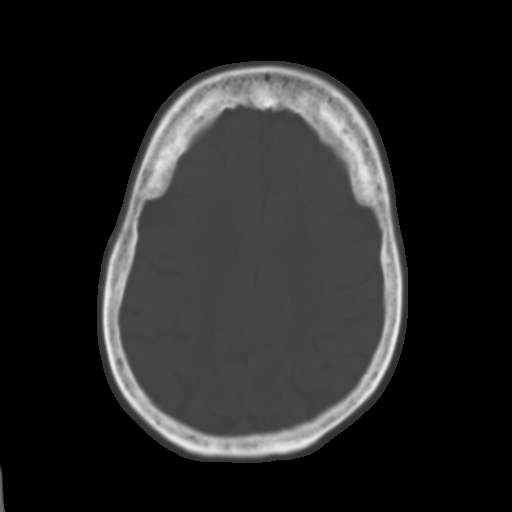
[im 24/32  brain]
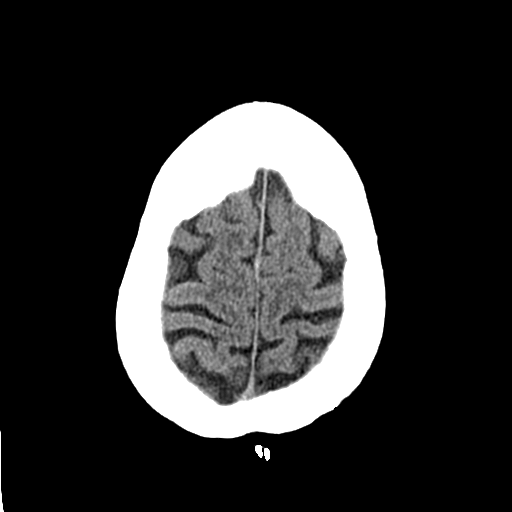
[im 28/32  brain]
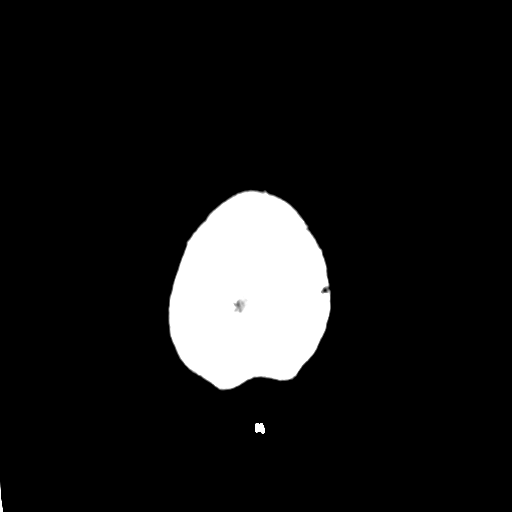

[Series 3: head bone · axial · 0.42mm/px · z∈[-174,-142]mm · 3 of 80 slices shown]
[im 8/80  bone]
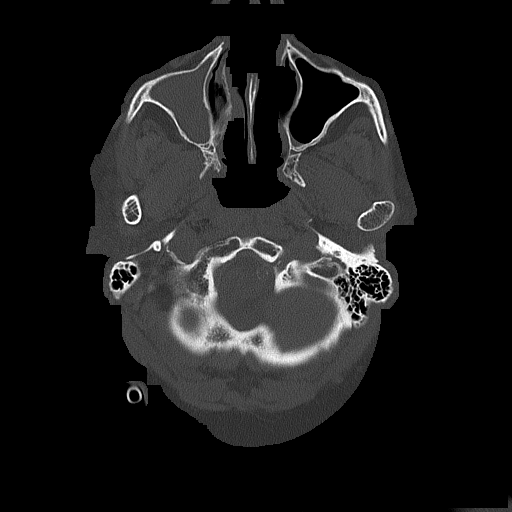
[im 16/80  bone]
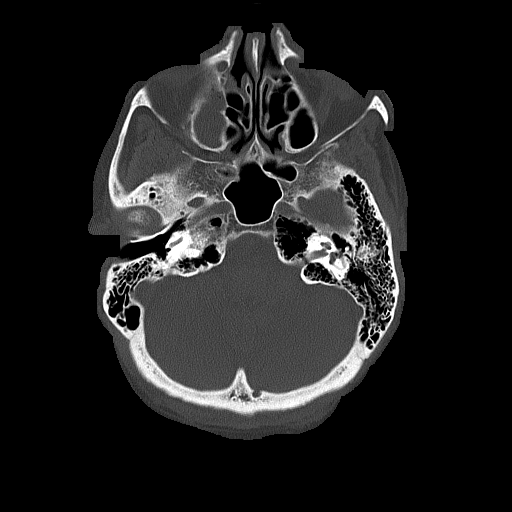
[im 24/80  bone]
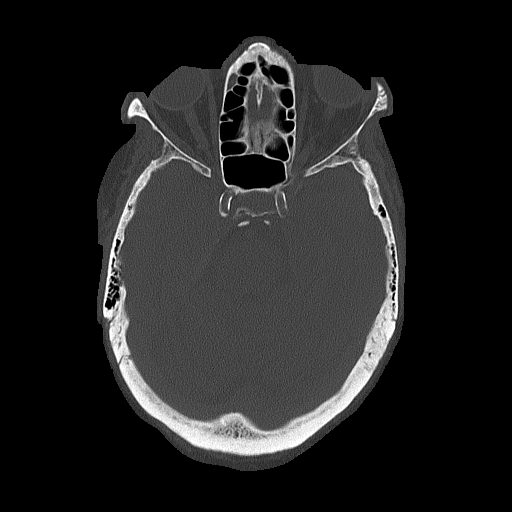

[Series 4: coronal soft tissue · coronal · 0.31mm/px · 3 of 64 slices shown]
[im 22/64  brain]
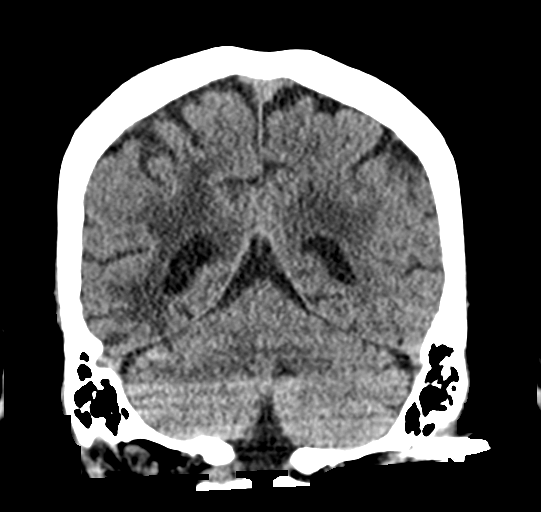
[im 29/64  brain]
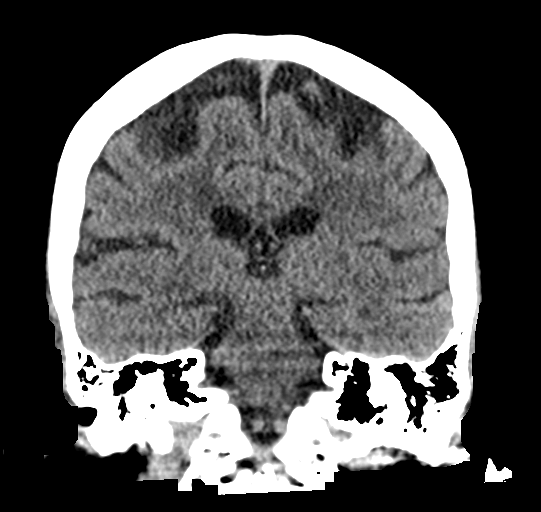
[im 36/64  brain]
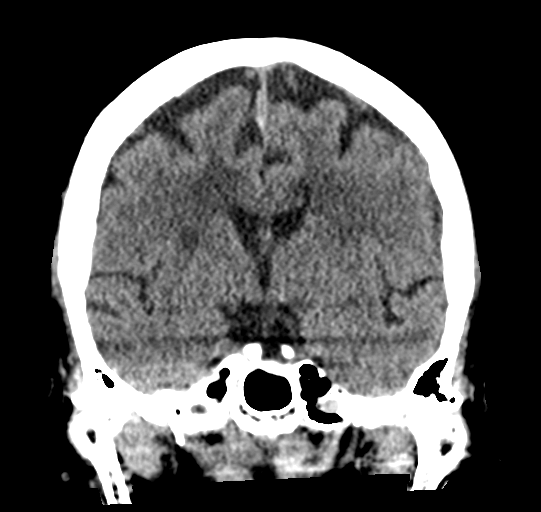

[Series 5: sagittal soft tissue · sagittal · 0.31mm/px · 3 of 54 slices shown]
[im 18/54  brain]
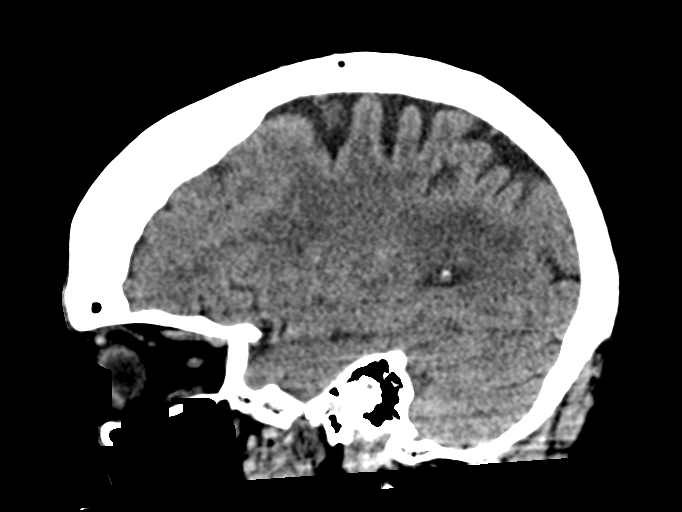
[im 27/54  brain]
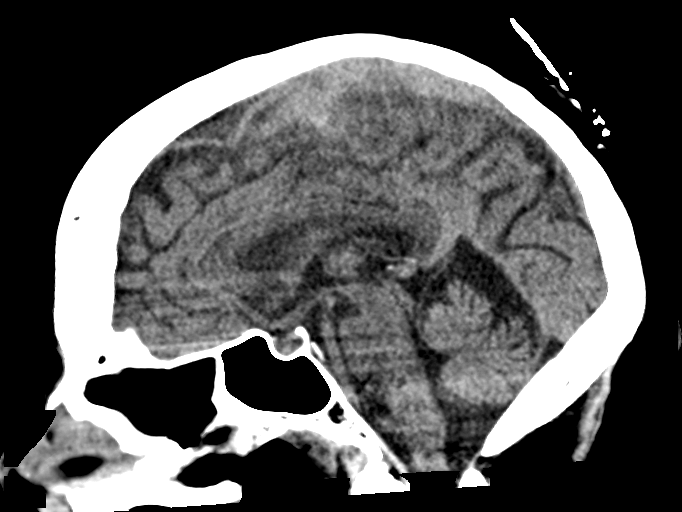
[im 36/54  brain]
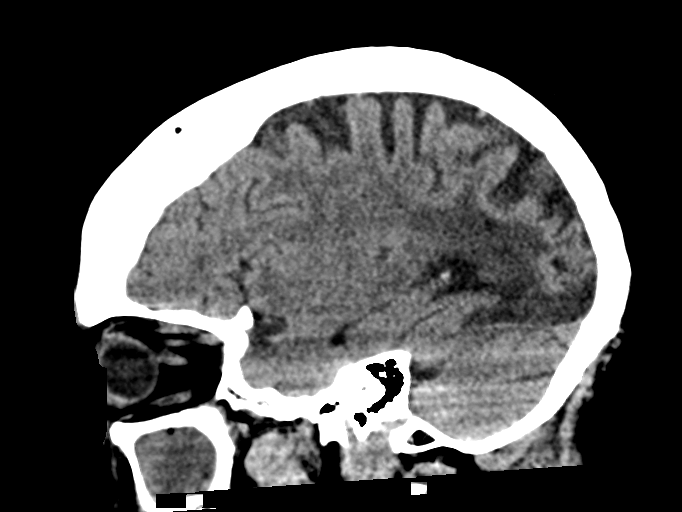

[16 of 47 positions shown; findings below may reference images not displayed]

FINDINGS: Brain: No evidence of acute infarction, hemorrhage, cerebral edema,
mass, mass effect, or midline shift. No hydrocephalus or extra-axial
fluid collection. Redemonstrated encephalomalacia in the
right-greater-than-left PCA territory. Periventricular white matter
changes, likely the sequela of chronic small vessel ischemic
disease.

Vascular: No hyperdense vessel. Atherosclerotic calcifications in
the intracranial carotid and vertebral arteries.

Skull: Hyperostosis frontalis. No acute fracture or focal lesion.

Sinuses/Orbits: Near complete opacification of the right maxillary
sinus and right frontal sinus. Mucosal thickening in the anterior
ethmoid air cells. No acute finding in the orbits.

Other: The mastoids are well aerated.
IMPRESSION: IMPRESSION
No acute intracranial process. Redemonstrated areas of
encephalomalacia in the right-greater-than-left PCA territory.

## 2021-09-03 IMAGING — CT CT ANGIO HEAD-NECK (W OR W/O PERF)
2 of 7 series · 8 of 33 positions shown · IV contrast (APPLIED)
Comparison: No prior CTA or CT V, correlation is made with CT head
[DATE].

CLINICAL DATA: Unresponsive, stroke suspected

EXAM:
CT ANGIOGRAPHY HEAD AND NECK
CT VENOGRAM
TECHNIQUE: Multidetector CT imaging of the head and neck was performed using
the standard protocol during bolus administration of intravenous
contrast. Multiplanar CT image reconstructions and MIPs were
obtained to evaluate the vascular anatomy. Carotid stenosis
measurements (when applicable) are obtained utilizing NASCET
criteria, using the distal internal carotid diameter as the
denominator.

[Series 3: cta neck/head · axial · 0.42mm/px · z∈[-188,-86]mm · 2 of 155 slices shown]
[im 52/155  soft-tissue]
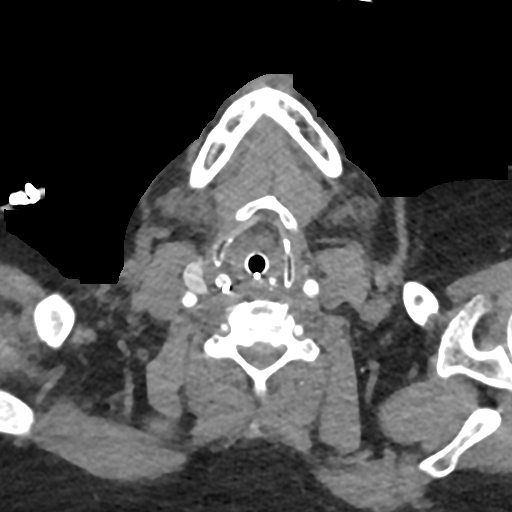
[im 103/155  soft-tissue]
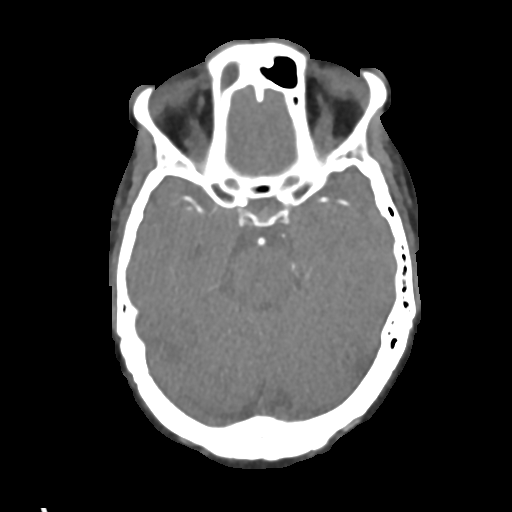

[Series 5: ax thins · axial · 0.39mm/px · z∈[-246,-26]mm · 6 of 310 slices shown]
[im 45/310  soft-tissue]
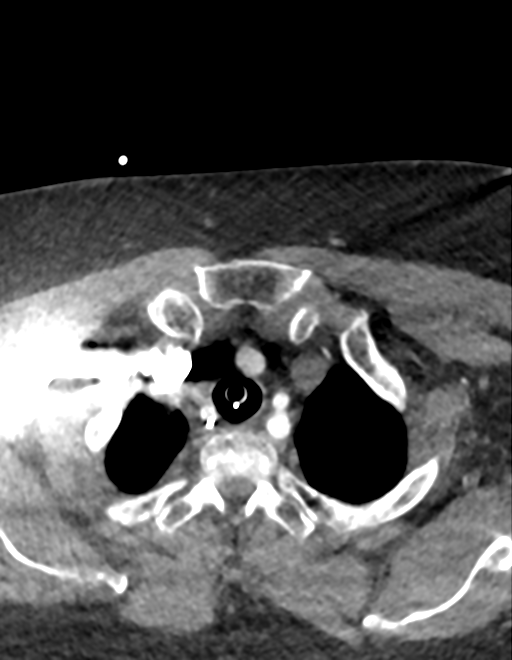
[im 89/310  bone]
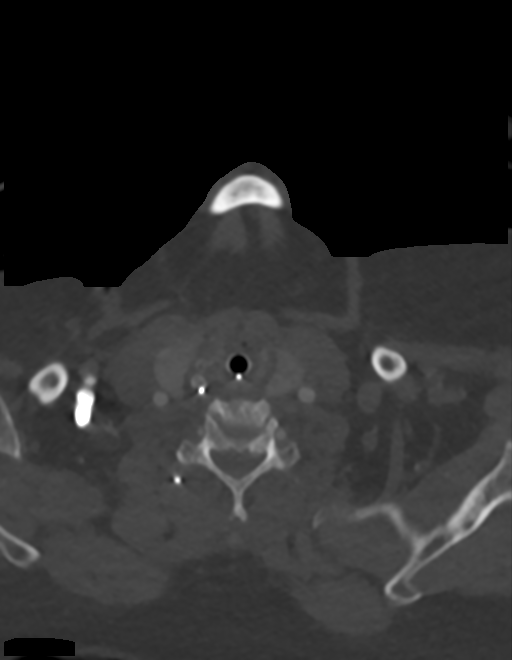
[im 133/310  soft-tissue]
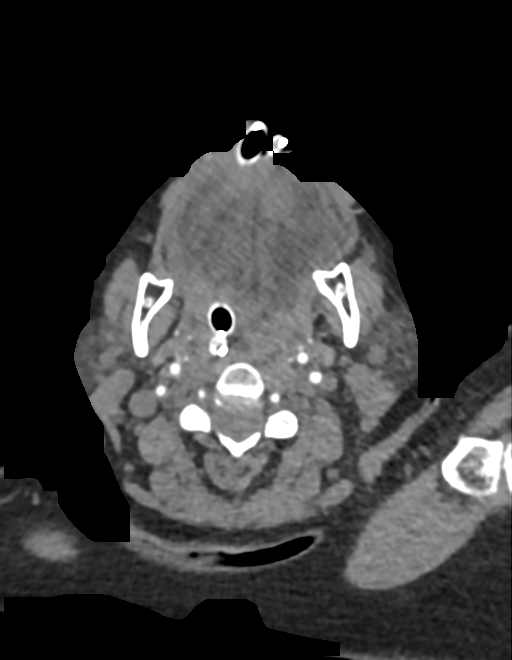
[im 177/310  bone]
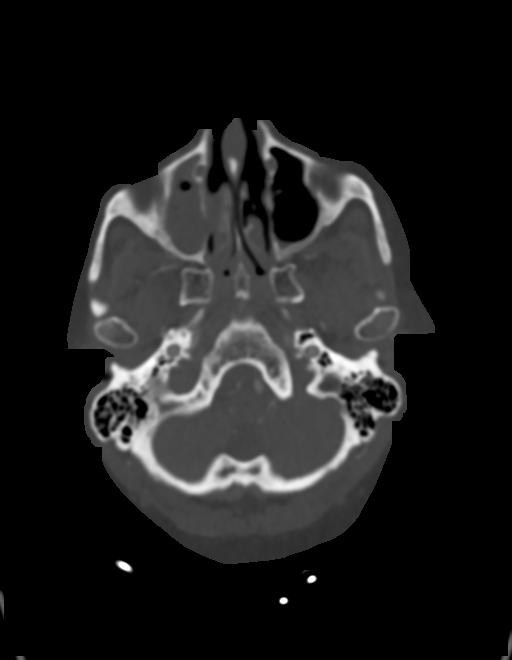
[im 221/310  soft-tissue]
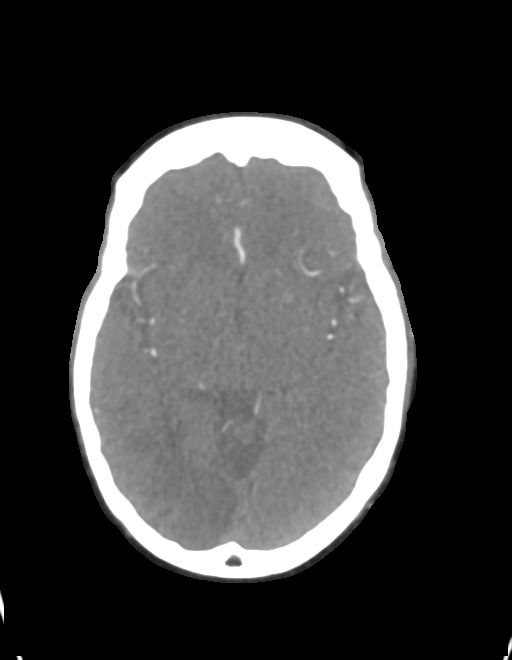
[im 265/310  bone]
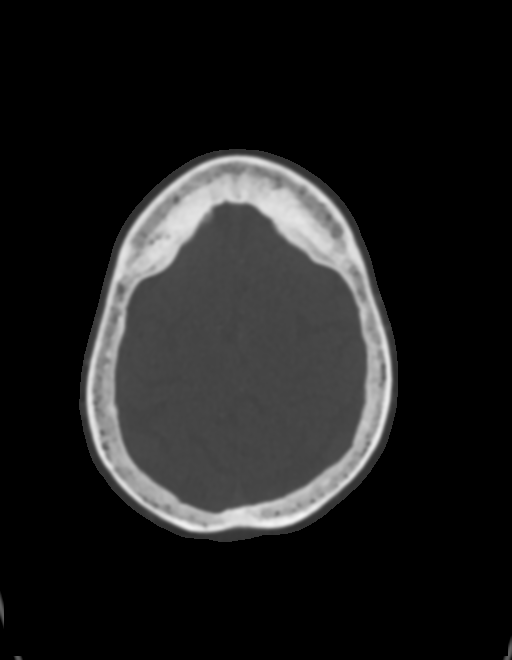

[8 of 33 positions shown; findings below may reference images not displayed]

Venographic phase images of the brain were obtained following the
administration of intravenous contrast. Multiplanar reformats and
maximum intensity projections were generated.

RADIATION DOSE REDUCTION: This exam was performed according to the
departmental dose-optimization program which includes automated
exposure control, adjustment of the mA and/or kV according to
patient size and/or use of iterative reconstruction technique.

CONTRAST:  75mL OMNIPAQUE IOHEXOL 350 MG/ML SOLN
FINDINGS: CT HEAD FINDINGS

For noncontrast findings, please see same day CT head.

CTA NECK FINDINGS

Aortic arch: Standard branching. Imaged portion shows no evidence of
aneurysm or dissection. No significant stenosis of the major arch
vessel origins.

Right carotid system: No evidence of dissection, occlusion, or
hemodynamically significant stenosis (greater than 50%).

Left carotid system: No evidence of dissection, occlusion, or
hemodynamically significant stenosis (greater than 50%).

Vertebral arteries: Left dominant. No evidence of dissection,
occlusion, or hemodynamically significant stenosis (greater than
50%).

Skeleton: No acute osseous abnormality.

Other neck: Endotracheal and orogastric tubes noted.

Upper chest: Focal pulmonary opacity or pleural effusion. The main
pulmonary artery measures up to 3.0 cm, just above the upper limit
of normal.

Review of the MIP images confirms the above findings

CTA HEAD FINDINGS

Anterior circulation: Both internal carotid arteries are patent to
the termini, with calcifications and moderate stenosis in the
bilateral cavernous and supraclinoid segments.

A1 segments patent. Normal anterior communicating artery. Anterior
cerebral arteries are patent to their distal aspects.

No M1 stenosis or occlusion. Normal MCA bifurcations. Distal MCA
branches perfused and symmetric.

Posterior circulation: Vertebral arteries patent to the
vertebrobasilar junction without stenosis. Posterior inferior
cerebral arteries patent bilaterally.

Basilar patent to its distal aspect. Superior cerebellar arteries
patent bilaterally.

Short segment severe stenosis or occlusion in the distal right
P1/proximal right P2 (series 5, images 95 and 96). The right PCA is
otherwise diminutive but patent with multifocal mild irregularity.
Patent left P1. The left PCA demonstrates multifocal mild narrowing
(series 5, image 99 and 100) but is perfused to its distal aspects.
The bilateral posterior communicating arteries are not visualized.

Venous sinuses: As permitted by contrast timing, patent.

Anatomic variants: None significant.

Review of the MIP images confirms the above findings

CTV FINDINGS

Superior sagittal sinus: Normal.

Straight sinus: Normal.

Inferior sagittal sinus, vein of INGELIS and internal cerebral veins:
Normal.

Transverse sinuses: Hypoplastic left transverse sinus is patent.
Normal right transverse sinus.

Sigmoid sinuses: Hypoplastic left sigmoid sinus is patent. Normal
right sigmoid sinus.

Visualized jugular veins: Normal
IMPRESSION: 1. Short segment severe stenosis or occlusion in the distal right
P1/proximal right P2, of indeterminate acuity given the patient's
history of prior right PCA territory.
2. Moderate stenosis in the bilateral cavernous and supraclinoid
segments.
3.  No hemodynamically significant stenosis in the neck.
4. No evidence of dural venous sinus stenosis or thrombosis.
5. Mild enlargement of the main pulmonary artery, as can be seen
with in the setting of pulmonary hypertension.

## 2021-09-03 IMAGING — DX DG CHEST 1V PORT
1 series · 1 of 1 positions shown · non-contrast
Comparison: [DATE]

CLINICAL DATA: Intubation

EXAM:
PORTABLE CHEST 1 VIEW

[chest ap]
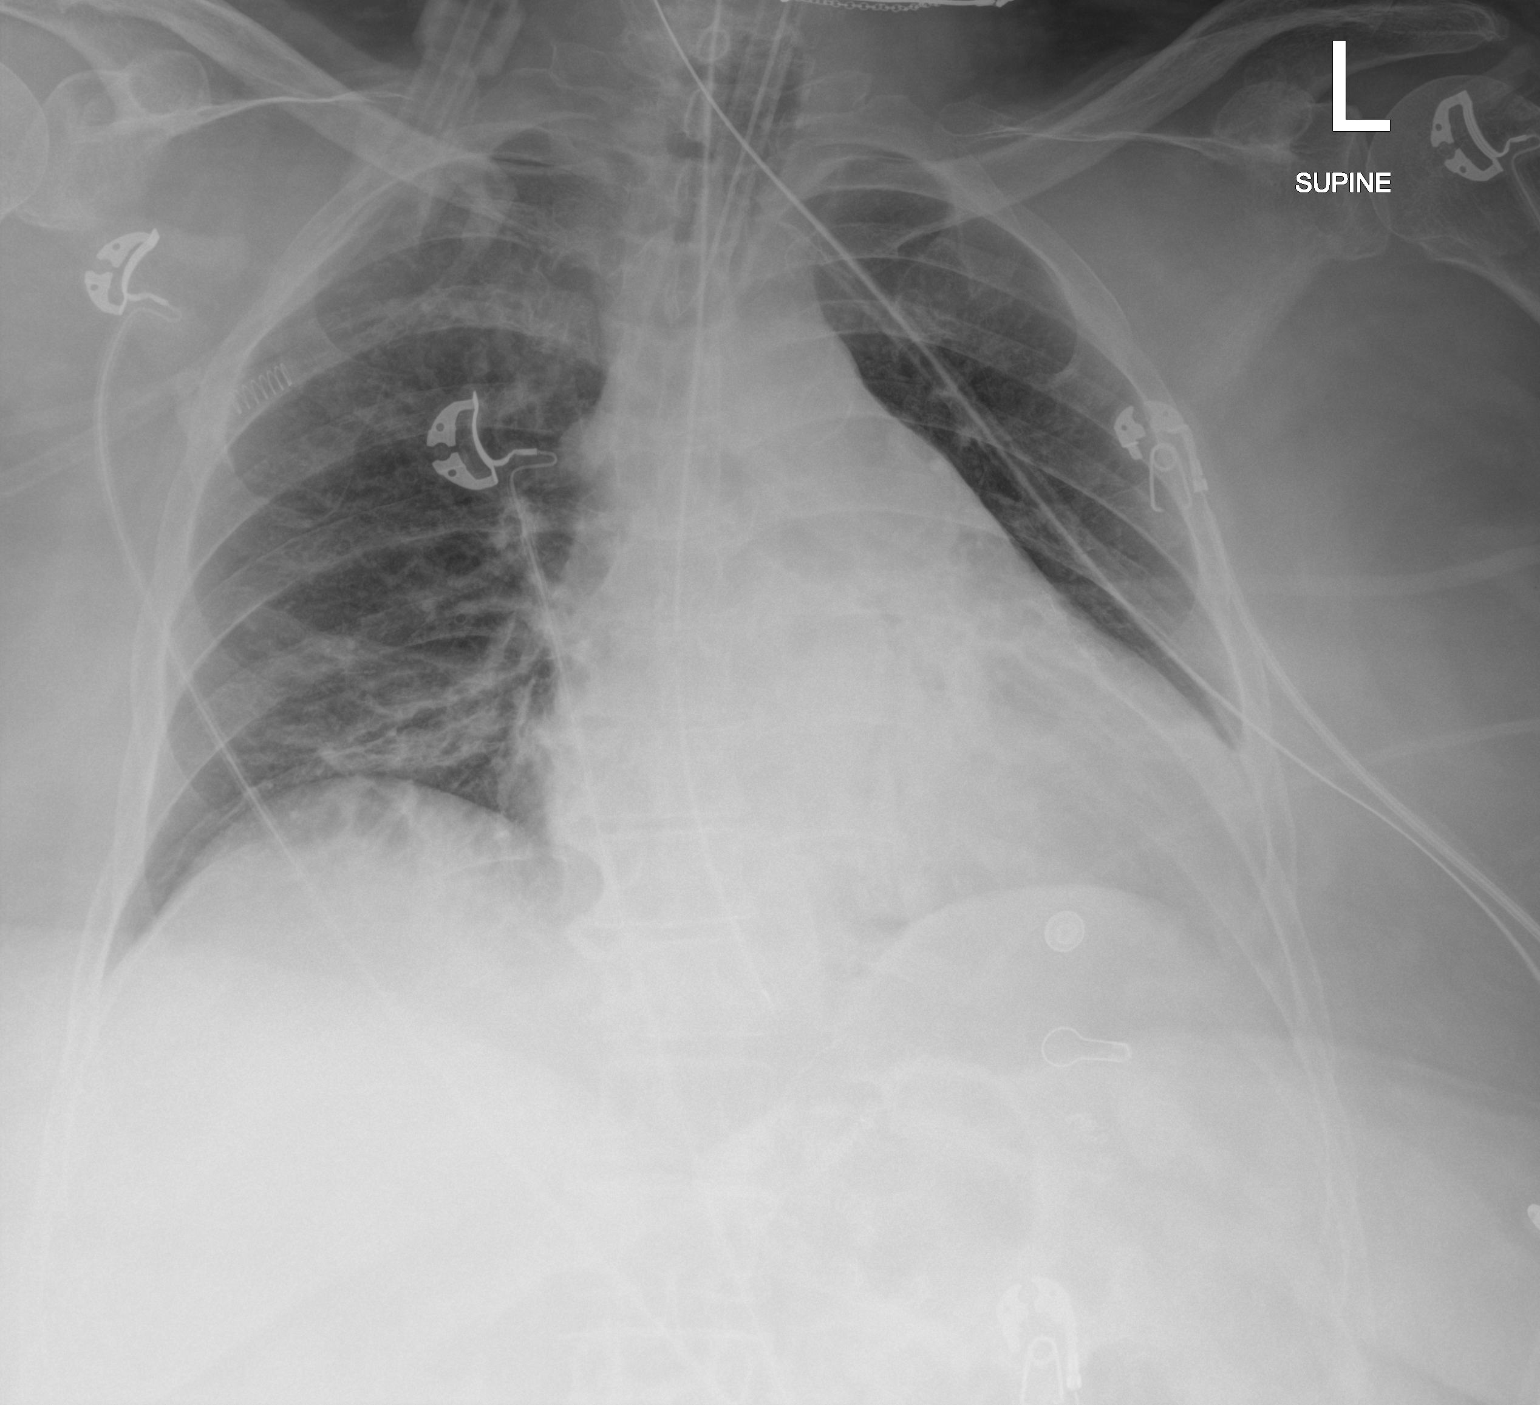

[1 of 1 positions shown; findings below may reference images not displayed]

FINDINGS: Endotracheal tube terminates 3 cm above the carina.

Mild patchy right lower lobe opacity, atelectasis versus pneumonia.
Left lung is clear. No pleural effusion or pneumothorax.

The heart is top-normal in size.

Enteric tube terminates in the distal esophagus with its side port
in the mid esophagus.
IMPRESSION: Endotracheal tube terminates 3 cm above the carina.

Enteric tube terminates in the distal esophagus with its side port
in the mid esophagus. Advancement approximately 15 cm is suggested.

Mild patchy right lower lobe opacity, atelectasis versus pneumonia.

## 2021-09-03 MED ORDER — LEVETIRACETAM IN NACL 500 MG/100ML IV SOLN
500.0000 mg | Freq: Two times a day (BID) | INTRAVENOUS | Status: DC
  Administered 2021-09-03 – 2021-09-07 (×10): 500 mg via INTRAVENOUS
  Filled 2021-09-03 (×10): qty 100

## 2021-09-03 MED ORDER — FENTANYL CITRATE PF 50 MCG/ML IJ SOSY
PREFILLED_SYRINGE | INTRAMUSCULAR | Status: AC
  Filled 2021-09-03: qty 2

## 2021-09-03 MED ORDER — FENTANYL CITRATE PF 50 MCG/ML IJ SOSY
100.0000 ug | PREFILLED_SYRINGE | Freq: Once | INTRAMUSCULAR | Status: AC
  Administered 2021-09-03: 100 ug via INTRAVENOUS

## 2021-09-03 MED ORDER — CHLORHEXIDINE GLUCONATE CLOTH 2 % EX PADS
6.0000 | MEDICATED_PAD | Freq: Every day | CUTANEOUS | Status: DC
  Administered 2021-09-03 – 2021-09-12 (×10): 6 via TOPICAL

## 2021-09-03 MED ORDER — ORAL CARE MOUTH RINSE
15.0000 mL | OROMUCOSAL | Status: DC
  Administered 2021-09-03 – 2021-09-08 (×43): 15 mL via OROMUCOSAL

## 2021-09-03 MED ORDER — LEVETIRACETAM 500 MG PO TABS
500.0000 mg | ORAL_TABLET | Freq: Two times a day (BID) | ORAL | Status: DC
Start: 2021-09-03 — End: 2021-09-13
  Administered 2021-09-08 – 2021-09-12 (×9): 500 mg via ORAL
  Filled 2021-09-03 (×9): qty 1

## 2021-09-03 MED ORDER — LEVETIRACETAM IN NACL 1000 MG/100ML IV SOLN
1000.0000 mg | Freq: Once | INTRAVENOUS | Status: AC
  Administered 2021-09-03: 1000 mg via INTRAVENOUS
  Filled 2021-09-03: qty 100

## 2021-09-03 MED ORDER — MIDAZOLAM-SODIUM CHLORIDE 100-0.9 MG/100ML-% IV SOLN
0.0000 mg/h | INTRAVENOUS | Status: DC
  Administered 2021-09-03 – 2021-09-04 (×3): 5 mg/h via INTRAVENOUS
  Filled 2021-09-03 (×2): qty 100

## 2021-09-03 MED ORDER — ATORVASTATIN CALCIUM 80 MG PO TABS
80.0000 mg | ORAL_TABLET | Freq: Every day | ORAL | Status: DC
  Administered 2021-09-03 – 2021-09-06 (×4): 80 mg
  Filled 2021-09-03 (×4): qty 1

## 2021-09-03 MED ORDER — MIDAZOLAM BOLUS VIA INFUSION
0.0000 mg | INTRAVENOUS | Status: DC | PRN
  Administered 2021-09-04: 5 mg via INTRAVENOUS
  Filled 2021-09-03: qty 5

## 2021-09-03 MED ORDER — PROPOFOL 1000 MG/100ML IV EMUL
5.0000 ug/kg/min | INTRAVENOUS | Status: DC
  Administered 2021-09-02: 20 ug/kg/min via INTRAVENOUS

## 2021-09-03 MED ORDER — SODIUM CHLORIDE 0.9 % IV SOLN: 2000.0000 mg | Freq: Once | INTRAVENOUS | Status: DC

## 2021-09-03 MED ORDER — CHLORHEXIDINE GLUCONATE 0.12% ORAL RINSE (MEDLINE KIT)
15.0000 mL | Freq: Two times a day (BID) | OROMUCOSAL | Status: DC
  Administered 2021-09-03 – 2021-09-12 (×13): 15 mL via OROMUCOSAL

## 2021-09-03 MED ORDER — DILTIAZEM HCL-DEXTROSE 125-5 MG/125ML-% IV SOLN (PREMIX)
5.0000 mg/h | INTRAVENOUS | Status: DC
  Administered 2021-09-03: 5 mg/h via INTRAVENOUS
  Filled 2021-09-03: qty 125

## 2021-09-03 MED ORDER — IOHEXOL 350 MG/ML SOLN
75.0000 mL | Freq: Once | INTRAVENOUS | Status: AC | PRN
  Administered 2021-09-03: 75 mL via INTRAVENOUS

## 2021-09-03 MED ORDER — PROPOFOL 1000 MG/100ML IV EMUL
INTRAVENOUS | Status: AC
  Administered 2021-09-03: 15 ug/kg/min via INTRAVENOUS
  Filled 2021-09-03: qty 100

## 2021-09-03 MED ORDER — SODIUM BICARBONATE 8.4 % IV SOLN
100.0000 meq | Freq: Once | INTRAVENOUS | Status: AC
  Administered 2021-09-03: 100 meq via INTRAVENOUS
  Filled 2021-09-03: qty 50

## 2021-09-03 MED ORDER — SUCCINYLCHOLINE CHLORIDE 200 MG/10ML IV SOSY: 150.0000 mg | PREFILLED_SYRINGE | Freq: Once | INTRAVENOUS | Status: AC

## 2021-09-03 MED ORDER — INSULIN ASPART 100 UNIT/ML IJ SOLN
0.0000 [IU] | INTRAMUSCULAR | Status: DC
  Administered 2021-09-03 (×2): 3 [IU] via SUBCUTANEOUS
  Administered 2021-09-04: 2 [IU] via SUBCUTANEOUS
  Administered 2021-09-04: 3 [IU] via SUBCUTANEOUS
  Administered 2021-09-04 – 2021-09-05 (×5): 2 [IU] via SUBCUTANEOUS
  Administered 2021-09-05 (×4): 3 [IU] via SUBCUTANEOUS
  Administered 2021-09-05 – 2021-09-12 (×12): 2 [IU] via SUBCUTANEOUS

## 2021-09-03 MED ORDER — POLYETHYLENE GLYCOL 3350 17 G PO PACK: 17.0000 g | PACK | Freq: Every day | ORAL | Status: DC | PRN

## 2021-09-03 MED ORDER — DOCUSATE SODIUM 100 MG PO CAPS: 100.0000 mg | ORAL_CAPSULE | Freq: Two times a day (BID) | ORAL | Status: DC | PRN

## 2021-09-03 MED ORDER — PROSOURCE TF PO LIQD
45.0000 mL | Freq: Three times a day (TID) | ORAL | Status: DC
  Administered 2021-09-03 – 2021-09-06 (×10): 45 mL
  Filled 2021-09-03 (×10): qty 45

## 2021-09-03 MED ORDER — PROPOFOL 1000 MG/100ML IV EMUL
15.0000 ug/kg/min | INTRAVENOUS | Status: DC
  Administered 2021-09-03 – 2021-09-04 (×3): 15 ug/kg/min via INTRAVENOUS
  Filled 2021-09-03 (×2): qty 100

## 2021-09-03 MED ORDER — PANTOPRAZOLE 2 MG/ML SUSPENSION
40.0000 mg | Freq: Every day | ORAL | Status: DC
  Administered 2021-09-03 – 2021-09-06 (×4): 40 mg
  Filled 2021-09-03 (×5): qty 20

## 2021-09-03 MED ORDER — MIDAZOLAM-SODIUM CHLORIDE 100-0.9 MG/100ML-% IV SOLN
0.5000 mg/h | INTRAVENOUS | Status: DC
  Administered 2021-09-03: 1 mg/h via INTRAVENOUS
  Administered 2021-09-03: 0.5 mg/h via INTRAVENOUS
  Filled 2021-09-03: qty 100

## 2021-09-03 MED ORDER — DOCUSATE SODIUM 50 MG/5ML PO LIQD: 100.0000 mg | Freq: Two times a day (BID) | ORAL | Status: DC | PRN

## 2021-09-03 MED ORDER — DOCUSATE SODIUM 50 MG/5ML PO LIQD
100.0000 mg | Freq: Two times a day (BID) | ORAL | Status: DC
  Administered 2021-09-03 – 2021-09-05 (×6): 100 mg
  Filled 2021-09-03 (×7): qty 10

## 2021-09-03 MED ORDER — POLYETHYLENE GLYCOL 3350 17 G PO PACK
17.0000 g | PACK | Freq: Every day | ORAL | Status: DC
  Administered 2021-09-03 – 2021-09-05 (×3): 17 g
  Filled 2021-09-03 (×4): qty 1

## 2021-09-03 MED ORDER — VITAL 1.5 CAL PO LIQD
1000.0000 mL | ORAL | Status: DC
Start: 2021-09-03 — End: 2021-09-08
  Administered 2021-09-03 – 2021-09-06 (×3): 1000 mL
  Filled 2021-09-03 (×2): qty 1000

## 2021-09-03 MED ORDER — ETOMIDATE 2 MG/ML IV SOLN: 30.0000 mg | Freq: Once | INTRAVENOUS | Status: AC

## 2021-09-03 NOTE — ED Notes (Signed)
Neuro provider at bedside  

## 2021-09-03 NOTE — ED Notes (Signed)
Patient return from CT

## 2021-09-03 NOTE — ED Notes (Signed)
Port CXR performed 

## 2021-09-03 NOTE — ED Notes (Signed)
Attempted to call Susan Mclean, next of kin listed in chart for consent to transfer, pt is intubated and sedated, not able to sign consent for transfer, message left by dr. Alfred Levins on answering machine.  ?

## 2021-09-03 NOTE — Progress Notes (Signed)
3 silver rings placed in biohazard bag, sealed, and patient label applied. Sitting on shelf in room until family can return to get them. Charge Nurse, Julaine Fusi RN aware.  ?

## 2021-09-03 NOTE — H&P (Signed)
? ?NAME:  Susan Mclean, MRN:  [id number], DOB:  February 02, 1951, LOS: 0 ?ADMISSION DATE:  09/03/2021, CONSULTATION DATE:  09/03/21 ?REFERRING MD:  EDP, CHIEF COMPLAINT:  intubated  ? ?History of Present Illness:  ?71 yo female with pmh of afib on eliquis, h/o dvt, HFpEF, htn, dm2, cad, ntesmi, ckd who presented to osh unresponsive and with L sided eye deviation, L weakness and nystagmus. All history obtained from chart as pt is intubated and unresponsive.  ? ?She was reportedly last known normal at 2300. Presented to [facility]. GCS 3 and was emergently intubated. She had spontaneous movement of R and no movement on L. Her L gaze deviation resolved. She was started on propofol and loaded with Keppra. CTH negative for acute cva but showed chronic b/l PCA strokes R >L. Neurology contacted for transfer for LTM and accepted pt. Upon arrival CCM was called after neurology saw pt with concern for status. We were asked to admit pt and they consult.  ? ?Pertinent  Medical History  ?H/o cva ?H/o afib on chronic eliqus ? ?Significant Hospital Events: ?Including procedures, antibiotic start and stop dates in addition to other pertinent events   ?Accepted in transfer by neurology ?Arrived in ed intubated at that time neurology had EDP contact ccm for admission and they became consultant.  ? ?Interim History / Subjective:  ? ? ?Objective   ?Blood pressure (!) 100/51, pulse (!) 43, temperature (!) 95.5 ?F (35.3 ?C), resp. rate 16, SpO2 100 %. ?   ?Vent Mode: PRVC ?FiO2 (%):  [40 %-75 %] 40 % ?Set Rate:  [16 bmp] 16 bmp ?Vt Set:  [400 mL] 400 mL ?PEEP:  [5 cmH20] 5 cmH20 ?Plateau Pressure:  [15 cmH20] 15 cmH20  ?No intake or output data in the 24 hours ending 09/03/21 0526 ?There were no vitals filed for this visit. ? ?Examination: ?General: sedated intubated unresponsive  ?HENT: ncat, pupils equal but pinpoint, mmmp ?Lungs: cta b ?Cardiovascular: irreg irreg ?Abdomen: soft, nd,nt, bs+ ?Extremities: no c/c/e ?Neuro: sedated,  unresponsive, withdraws to pain in ble and R UE, L UE spontaneous movement but not antigravity ?GU: deferred ? ?Resolved Hospital Problem list   ? ? ?Assessment & Plan:  ?Acute hypoxic resp failure:  ?-titrate vent ?-intubated for airway protection ?-sat sbt when able after ltm reviewed ? ?Seizure like activity ?? Status epilepticus:  ?-neuro is following ?-aed per neuro ?-ltm  ? ?HFpEF:  ?-no acute exacerbation ?-bp control ? ?T2dm:  ?-ssi ?-a1c in 11/22 =6.2 ? ?H/o CVA:  ?-noted ? ?Ckd4:  ?-appears to be baseline renal function at this time.  ?-follow indices ? ?Metabolic acidosis:  ?-will give 2 amps bicarb ?-check lactic ? ? ?Best Practice (right click and "Reselect all SmartList Selections" daily)  ? ?Diet/type: NPO w/ meds via tube ?DVT prophylaxis: SCD ?GI prophylaxis: PPI ?Lines: N/A ?Foley:  Yes, and it is still needed ?Code Status:  full code ?Last date of multidisciplinary goals of care discussion [pending] ? ?Labs   ?CBC: ?Recent Labs  ?Lab 09/02/21 ?2351  ?WBC 10.2  ?NEUTROABS 3.4  ?HGB 11.9*  ?HCT 38.9  ?MCV 90.7  ?PLT 145*  ? ? ?Basic Metabolic Panel: ?Recent Labs  ?Lab 09/02/21 ?2351  ?NA 141  ?K 5.0  ?CL 114*  ?CO2 15*  ?GLUCOSE 207*  ?BUN 31*  ?CREATININE 2.28*  ?CALCIUM 9.2  ? ?GFR: ?Estimated Creatinine Clearance: 23.6 mL/min (A) (by C-G formula based on SCr of 2.28 mg/dL (H)). ?Recent Labs  ?Lab 09/02/21 ?2351  ?  WBC 10.2  ? ? ?Liver Function Tests: ?Recent Labs  ?Lab 09/02/21 ?2351  ?AST 29  ?ALT 12  ?ALKPHOS 187*  ?BILITOT 0.6  ?PROT 8.2*  ?ALBUMIN 3.7  ? ?No results for input(s): LIPASE, AMYLASE in the last 168 hours. ?No results for input(s): AMMONIA in the last 168 hours. ? ?ABG ?   ?Component Value Date/Time  ? PHART 7.17 (LL) 09/03/2021 0033  ? PCO2ART 36 09/03/2021 0033  ? PO2ART 128 (H) 09/03/2021 0033  ? HCO3 13.1 (L) 09/03/2021 0033  ? ACIDBASEDEF 14.4 (H) 09/03/2021 0033  ? O2SAT 96.7 09/03/2021 0033  ?  ? ?Coagulation Profile: ?Recent Labs  ?Lab 09/02/21 ?2351  ?INR 1.2  ? ? ?Cardiac  Enzymes: ?No results for input(s): CKTOTAL, CKMB, CKMBINDEX, TROPONINI in the last 168 hours. ? ?HbA1C: ?Hgb A1c MFr Bld  ?Date/Time Value Ref Range Status  ?03/09/2021 06:26 AM 6.2 (H) 4.8 - 5.6 % Final  ?  Comment:  ?  (NOTE) ?Pre diabetes:          5.7%-6.4% ? ?Diabetes:              >6.4% ? ?Glycemic control for   <7.0% ?adults with diabetes ?  ? ? ?CBG: ?Recent Labs  ?Lab 09/02/21 ?2339 09/03/21 ?0314  ?GLUCAP 199* 123*  ? ? ?Review of Systems:   ?unobtainable ? ?Past Medical History:  ?She,  has a past medical history of Atrial fibrillation ([place]), CHF (congestive heart failure) ([place]), DVT (deep venous thrombosis) ([place]), HTN (hypertension), Hypertension, and MI (myocardial infarction) ([place]).  ? ?Surgical History:  ? ?Past Surgical History:  ?Procedure Laterality Date  ? CORONARY STENT INTERVENTION N/A 03/09/2021  ? Procedure: CORONARY STENT INTERVENTION;  Surgeon: Nelva Bush, MD;  Location: [facility];  Service: Cardiovascular;  Laterality: N/A;  ? CORONARY STENT PLACEMENT    ? LEFT HEART CATH AND CORONARY ANGIOGRAPHY N/A 03/09/2021  ? Procedure: LEFT HEART CATH AND CORONARY ANGIOGRAPHY;  Surgeon: Nelva Bush, MD;  Location: [facility];  Service: Cardiovascular;  Laterality: N/A;  ?  ? ?Social History:  ? reports that she has never smoked. She has never used smokeless tobacco. She reports that she does not currently use alcohol. She reports that she does not use drugs.  ? ?Family History:  ?Her family history is not on file.  ? ?Allergies ?No Known Allergies  ? ?Home Medications  ?Prior to Admission medications   ?Medication Sig Start Date End Date Taking? Authorizing Provider  ?apixaban (ELIQUIS) 5 MG TABS tablet Take 1 tablet (5 mg total) by mouth 2 (two) times daily. 03/13/21   Fritzi Mandes, MD  ?atorvastatin (LIPITOR) 80 MG tablet Take 1 tablet (80 mg total) by mouth daily. 03/14/21   Fritzi Mandes, MD  ?carvedilol (COREG) 6.25 MG tablet Take 1 tablet (6.25 mg total) by mouth 2 (two)  times daily with a meal. 03/13/21   Fritzi Mandes, MD  ?clopidogrel (PLAVIX) 75 MG tablet Take 1 tablet (75 mg total) by mouth daily with breakfast. 03/14/21   Fritzi Mandes, MD  ?nitroGLYCERIN (NITROSTAT) 0.4 MG SL tablet Place 1 tablet (0.4 mg total) under the tongue every 5 (five) minutes as needed for chest pain. 03/13/21   Fritzi Mandes, MD  ?  ? ?Critical care time: 43 min of critical care time between 0430-0700 excluding procedures ?  ? ? ? ?  ?

## 2021-09-03 NOTE — Progress Notes (Signed)
Code stroke activated ?

## 2021-09-03 NOTE — ED Provider Notes (Signed)
? ?[facility] ?Provider Note ? ? ? Event Date/Time  ? First MD Initiated Contact with Patient 09/02/21 2352   ?  (approximate) ? ? ?History  ? ?No chief complaint on file. ? ? ?HPI ? ?Anais Denslow is a 71 y.o. female with a history of atrial fibrillation on Eliquis, CHF preserved EF, DVT, hypertension, CVA who presents for altered mental status.  Patient comes from home and according to EMS is usually alert and oriented and pretty independent at baseline.  She went to bed this evening and when son went to check on her she was unresponsive.  When EMS arrived patient was unresponsive with left-sided gaze deviation.  Sats were in the mid 80s, blood glucose was normal.  Initially they felt patient had pinpoint pupils and gave her some Narcan with no significant change. ?  ? ? ?Past Medical History:  ?Diagnosis Date  ? Atrial fibrillation ([place])   ? CHF (congestive heart failure) ([place])   ? DVT (deep venous thrombosis) ([place])   ? Left lower extremity  ? HTN (hypertension)   ? Hypertension   ? MI (myocardial infarction) ([place])   ? ? ?Past Surgical History:  ?Procedure Laterality Date  ? CORONARY STENT INTERVENTION N/A 03/09/2021  ? Procedure: CORONARY STENT INTERVENTION;  Surgeon: Nelva Bush, MD;  Location: [facility];  Service: Cardiovascular;  Laterality: N/A;  ? CORONARY STENT PLACEMENT    ? LEFT HEART CATH AND CORONARY ANGIOGRAPHY N/A 03/09/2021  ? Procedure: LEFT HEART CATH AND CORONARY ANGIOGRAPHY;  Surgeon: Nelva Bush, MD;  Location: [facility];  Service: Cardiovascular;  Laterality: N/A;  ? ? ? ?Physical Exam  ? ?Triage Vital Signs: ?ED Triage Vitals  ?Enc Vitals Group  ?   BP 09/02/21 2337 (!) 223/120  ?   Pulse Rate 09/02/21 2337 (!) 137  ?   Resp 09/02/21 2337 (!) 21  ?   Temp --   ?   Temp src --   ?   SpO2 09/02/21 2337 99 %  ?   Weight 09/02/21 2342 201 lb 1.3 oz (91.2 kg)  ?   Height 09/02/21 2342 5\' 1"  (1.549 m)  ?   Head Circumference --   ?   Peak Flow --    ?   Pain Score --   ?   Pain Loc --   ?   Pain Edu? --   ?   Excl. in [place]? --   ? ? ?Most recent vital signs: ?Vitals:  ? 09/03/21 0200 09/03/21 0205  ?BP: 103/86 131/62  ?Pulse: (!) 58 (!) 57  ?Resp: 18 (!) 21  ?Temp: (!) 95.3 ?F (35.2 ?C) (!) 95.3 ?F (35.2 ?C)  ?SpO2: 100% 100%  ? ? ? ?Constitutional: Unresponsive with left gaze deviation  ?HEENT: ?     Head: Normocephalic and atraumatic.    ?     Eyes: Conjunctivae are normal. Sclera is non-icteric.  Pupils reactive bilaterally with left and gaze deviation and horizontal nystagmus bilaterally ?     Mouth/Throat: Mucous membranes are moist.  ?     Neck: Supple with no signs of meningismus. ?Cardiovascular: Irregularly irregular rhythm ?Respiratory: Normal respiratory effort. Lungs are clear to auscultation bilaterally.  ?Gastrointestinal: Soft, non distended ?Musculoskeletal:  No edema, cyanosis, or erythema of extremities. ?Neurologic: GCS 3 with L gaze deviation and horizontal nystagmus, not withadrawing to noxious stilumi ?Skin: Skin is warm, dry and intact. No rash noted. ? ? ?ED Results / Procedures /  Treatments  ? ?Labs ?(all labs ordered are listed, but only abnormal results are displayed) ?Labs Reviewed  ?CBC - Abnormal; Notable for the following components:  ?    Result Value  ? Hemoglobin 11.9 (*)   ? Platelets 145 (*)   ? All other components within normal limits  ?DIFFERENTIAL - Abnormal; Notable for the following components:  ? Lymphs Abs 5.9 (*)   ? All other components within normal limits  ?COMPREHENSIVE METABOLIC PANEL - Abnormal; Notable for the following components:  ? Chloride 114 (*)   ? CO2 15 (*)   ? Glucose, Bld 207 (*)   ? BUN 31 (*)   ? Creatinine, Ser 2.28 (*)   ? Total Protein 8.2 (*)   ? Alkaline Phosphatase 187 (*)   ? GFR, Estimated 23 (*)   ? All other components within normal limits  ?URINALYSIS, ROUTINE W REFLEX MICROSCOPIC - Abnormal; Notable for the following components:  ? Color, Urine YELLOW (*)   ? APPearance CLEAR (*)   ?  Hgb urine dipstick SMALL (*)   ? Protein, ur 30 (*)   ? Bacteria, UA RARE (*)   ? All other components within normal limits  ?BLOOD GAS, ARTERIAL - Abnormal; Notable for the following components:  ? pH, Arterial 7.17 (*)   ? pO2, Arterial 128 (*)   ? Bicarbonate 13.1 (*)   ? Acid-base deficit 14.4 (*)   ? All other components within normal limits  ?CBG MONITORING, ED - Abnormal; Notable for the following components:  ? Glucose-Capillary 199 (*)   ? All other components within normal limits  ?RESP PANEL BY RT-PCR (FLU A&B, COVID) ARPGX2  ?ETHANOL  ?PROTIME-INR  ?APTT  ?URINE DRUG SCREEN, QUALITATIVE ([facility] ONLY)  ?MAGNESIUM  ?TROPONIN I (HIGH SENSITIVITY)  ? ? ? ?EKG ? ?ED ECG REPORT ?I, Rudene Re, the attending physician, personally viewed and interpreted this ECG. ? ?Atrial fibrillation with a rate of 131, no ST elevations or depressions. ? ?RADIOLOGY ?I, Rudene Re, attending MD, have personally viewed and interpreted the images obtained during this visit as below: ? ?CT head showing no acute findings ? ? ?___________________________________________________ ?Interpretation by Radiologist:  ?[place] (5MM) ? ?Result Date: 09/03/2021 ?CLINICAL DATA:  Unresponsive stroke suspected EXAM: CT HEAD WITHOUT CONTRAST TECHNIQUE: Contiguous axial images were obtained from the base of the skull through the vertex without intravenous contrast. RADIATION DOSE REDUCTION: This exam was performed according to the departmental dose-optimization program which includes automated exposure control, adjustment of the mA and/or kV according to patient size and/or use of iterative reconstruction technique. COMPARISON:  07/29/2021 FINDINGS: Brain: No evidence of acute infarction, hemorrhage, cerebral edema, mass, mass effect, or midline shift. No hydrocephalus or extra-axial fluid collection. Redemonstrated encephalomalacia in the right-greater-than-left PCA territory. Periventricular white matter changes, likely  the sequela of chronic small vessel ischemic disease. Vascular: No hyperdense vessel. Atherosclerotic calcifications in the intracranial carotid and vertebral arteries. Skull: Hyperostosis frontalis. No acute fracture or focal lesion. Sinuses/Orbits: Near complete opacification of the right maxillary sinus and right frontal sinus. Mucosal thickening in the anterior ethmoid air cells. No acute finding in the orbits. Other: The mastoids are well aerated. IMPRESSION: IMPRESSION No acute intracranial process. Redemonstrated areas of encephalomalacia in the right-greater-than-left PCA territory. Electronically Signed   By: Merilyn Baba M.D.   On: 09/03/2021 00:15  ? ?DG Chest Portable 1 View ? ?Result Date: 09/03/2021 ?CLINICAL DATA:  Intubation EXAM: PORTABLE CHEST 1 VIEW COMPARISON:  07/29/2021 FINDINGS:  Endotracheal tube terminates 3 cm above the carina. Mild patchy right lower lobe opacity, atelectasis versus pneumonia. Left lung is clear. No pleural effusion or pneumothorax. The heart is top-normal in size. Enteric tube terminates in the distal esophagus with its side port in the mid esophagus. IMPRESSION: Endotracheal tube terminates 3 cm above the carina. Enteric tube terminates in the distal esophagus with its side port in the mid esophagus. Advancement approximately 15 cm is suggested. Mild patchy right lower lobe opacity, atelectasis versus pneumonia. Electronically Signed   By: Julian Hy M.D.   On: 09/03/2021 00:34  ? ?CT VENOGRAM HEAD ? ?Result Date: 09/03/2021 ?CLINICAL DATA:  Unresponsive, stroke suspected EXAM: CT ANGIOGRAPHY HEAD AND NECK CT VENOGRAM TECHNIQUE: Multidetector CT imaging of the head and neck was performed using the standard protocol during bolus administration of intravenous contrast. Multiplanar CT image reconstructions and MIPs were obtained to evaluate the vascular anatomy. Carotid stenosis measurements (when applicable) are obtained utilizing NASCET criteria, using the distal  internal carotid diameter as the denominator. Venographic phase images of the brain were obtained following the administration of intravenous contrast. Multiplanar reformats and maximum intensity projection

## 2021-09-03 NOTE — Progress Notes (Signed)
EEG complete - results pending 

## 2021-09-03 NOTE — Progress Notes (Signed)
Patient currently moving 2H06 ?

## 2021-09-03 NOTE — ED Notes (Addendum)
RT at the bedside to draw ABG ?

## 2021-09-03 NOTE — ED Provider Notes (Signed)
Transfer from [facility] intubated for EGG.  Seen by Dr. Lorrin Goodell.   ? ?RRR ?CTAB ?NABS, soft ? ?PCCM called for admission ?  ?Ennis Delpozo, MD ?09/03/21 0456 ? ?

## 2021-09-03 NOTE — Progress Notes (Signed)
LTM EEG hooked up and running - no initial skin breakdown - push button tested - neuro notified. [facility] monitoring.  

## 2021-09-03 NOTE — Consult Note (Signed)
[facility] ?[facility] Tele[facility]urology Consult Services ? ? ?Patient Name:   Susan, Mclean ?Date of Birth:   [birth year] ?Identification Number:   MRN - [id number] ?Date of Service:   09/03/2021 02:00:09 ? ?Diagnosis: ?      R41.89 - Unresponsive ? ?Impression: ?     Patient is a 71 yo F with a PMH of Afib on Eliquis (no missed doses in three years per EMR), DVT, CHF, HTN, DM II, CKD, CAD, NSTEMI 03/2021 who initially presented to the ED with unresponsiveness with left head and gaze deviation, left sided weakness and nystagmus. Limited HPI available, per report patient was found unresponsive in bed this evening by her son. LNK per report 23:00. Persistent left gaze deviation with nystagmus on ED arrival. GCS of 3 requiring emergent intubation. NIHSS limited by intubation. Spontaneous movement of her right side, no movement of the left side. Left gaze deviation and nystagmus has resolved. She is on propofol for sedation. Keppra load given in the ED. Initial SBP 230s on arrival. Now 110s. In Afib with RVR on arrival. [facility] revealed chronic b/l PCA strokes right greater than left. CTA head and neck, showed Short segment severe stenosis or occlusion in the distal right P1/proximal right P2, c/w prior right PCA stroke seen on CTH. No evidence of dural venous sinus thrombus. Suspected status epilepticus, with left gaze deviaiton and left sided deficits. No acute corresponding LVO seen on CTA, distal right PI stenosis vs short segment occlusion is favored to be chronic with evidence of chronic bilateral PCA strokes on CTH. Patient is pending emergent transfer to [facility] for continuous EEG. Acute stroke cannot be completely excluded. At this time thrombolytic therapy is not recommended, unknown inclusion adn exclusion criteria and unknown last NOAC dose (per review of EMR, no missed doses in three years). Multiple attempts made to contact her son and house by myself and the ED MD, unsuccessfully. ?  ? ?Our  recommendations are outlined below. ? ?Recommendations: ? ?      Stroke/Telemetry Floor ?      Neuro Checks ?      Bedside Swallow Eval ?      DVT Prophylaxis ?      IV Fluids, Normal Saline ?      Head of Bed 30 Degrees ?      Euglycemia and Avoid Hyperthermia (PRN Acetaminophen) ?      Initiate or continue Aspirin 81 MG daily ?      Antihypertensives PRN if Blood pressure is greater than 220/120 or there is a concern for End organ damage/contraindications for permissive HTN. If blood pressure is greater than 220/120 give labetalol PO or IV or Vasotec IV with a goal of 15% reduction in BP during the first 24 hours. ?      Keppra given ?      Propofol and versed sedation initiated ?      Patient is currently being transferred to [facility] for continuous EEG. Further diagnostic testing and treatments to be determined by accepting facility ? ?Per facility request will defer further work up, management, and referrals to inpatient service, inclusive of inpatient neurology consult. ? ?Sign Out: ?      Discussed with Emergency Department Provider ? ? ? ?------------------------------------------------------------------------------ ? ?Advanced Imaging: ?CTA Head and Neck Completed. ? ?LVO:No ? ?Patient doesn't meet criteria for emergent NIR consideration ? ? ?Metrics: ?Last Known Well: 09/02/2021 23:00:00 ?[facility] Notification Time: 09/03/2021 02:00:09 ?Arrival Time: 09/02/2021 23:31:00 ?Stamp Time: 09/03/2021 02:00:09 ?Initial Response  Time: 09/03/2021 02:04:12 ?Symptoms: unresponsive. ?NIHSS Start Assessment Time: 09/03/2021 02:04:00 ?Patient is not a candidate for Thrombolytic. ?Thrombolytic Medical Decision: 09/03/2021 02:25:00 ?Patient was not deemed candidate for Thrombolytic because of following reasons: ?As noted above. ? ?I personally Reviewed the CT Head and it Showed chronic bilateral PCA strokes ? ?Primary Provider Notified of Diagnostic Impression and Management Plan on: 09/03/2021  02:31:45 ? ? ? ?------------------------------------------------------------------------------ ? ?History of Present Illness: ?Patient is a 71 year old Female. ? ?Patient was brought by EMS for symptoms of unresponsive. ?Susan Mclean is a 71 yo F with a PMH of Afib on Eliquis (no missed doses in three years per EMR), DVT, CHF, HTN, DM II, CKD, CAD, NSTEMI 03/2021 who initially presented to the ED with unresponsiveness with left head and gaze deviation, left sided weakness and nystagmus. Limited HPI available, per report patient was found unresponsive in bed this evening by her son. LNK per report 23:00. On EMS arrival unresponsive with left gaze deviation, hypoxic to 80s. Noted to have pinpoint pupils, no improvement with Narcan. Persistent left gaze deviation with nystagmus on ED arrival. GCS of 3 requiring emergent intubation. ?  ? ?Past Medical History: ?Othere PMH:  as noted above ? ?Medications: ? ?Anticoagulant use:  Yes Eliquis per EMR review, recent visits ?Antiplatelet use: Yes Plavix per EMR ?Reviewed EMR for current medications ? ?Allergies:  ?Reviewed ?Description: In EMR ? ?Social History: ?Unable To Obtain Due To Patient Status : Patient Cannot Speak ? ?Family History: ? ?Family History Cannot Be Obtained Because:Patient Cannot Speak ?There is no family history of premature cerebrovascular disease pertinent to this consultation ? ?ROS : ?14 Points Review of Systems was performed and was negative except mentioned in HPI. ?ROS Cannot Be Obtained Because:  Patient Cannot Speak ? ?Past Surgical History: ?Past Surgical History Cannot Be Obtained Because: Patient Cannot Speak ?There Is No Surgical History Contributory To Today?s Visit ? ?  ? ?Examination: ?BP(130//73), Pulse(88), ?1A: Level of Consciousness - Postures or Unresponsive + 3 ?1B: Ask Month and Age - Dysarthric/Intubated/ Trauma/Language Barrier + 1 ?1C: Blink Eyes & Squeeze Hands - Performs 0 Tasks + 2 ?2: Test Horizontal Extraocular Movements -  Normal + 0 ?3: Test Visual Fields - No Visual Loss + 0 ?4: Test Facial Palsy (Use Grimace if Obtunded) - Normal symmetry + 0 ?5A: Test Left Arm Motor Drift - No Effort Against Gravity + 3 ?5B: Test Right Arm Motor Drift - No Drift for 10 Seconds + 0 ?6A: Test Left Leg Motor Drift - No Effort Against Gravity + 3 ?6B: Test Right Leg Motor Drift - No Drift for 5 Seconds + 0 ?7: Test Limb Ataxia (FNF/Heel-Shin) - Does Not Understand + 0 ?8: Test Sensation - Coma/Unresponsive + 2 ?9: Test Language/Aphasia - Normal; No aphasia + 0 ?10: Test Dysarthria - Intubated/Unable to Test + 0 ?11: Test Extinction/Inattention - No abnormality + 0 ? ?NIHSS Score: 14 ? ? ?Pre-Morbid Modified Rankin Scale: ?Unable to assess ? ? ?Patient/Family was informed the Neurology Consult would occur via TeleHealth consult by way of interactive audio and video telecommunications and consented to receiving care in this manner. ? ? ?Patient is being evaluated for possible acute neurologic impairment and high probability of imminent or life-threatening deterioration. I spent total of 25 minutes providing care to this patient, including time for face to face visit via telemedicine, review of medical records, imaging studies and discussion of findings with providers, the patient and/or family. ? ? ?Dr Ruffin Frederick ? ? ?  [facility] ?[phone] ? ? ?Case [id number] ?  ?

## 2021-09-03 NOTE — ED Notes (Addendum)
Warm measures performed  ?

## 2021-09-03 NOTE — ED Notes (Signed)
Pt has gray colored necklace that was left prior to transport to [facility]. Necklace is labeled and sitting at the [facility] ED charge desk. Messaged RN taking care of pt currently to let family know.  ?

## 2021-09-03 NOTE — ED Notes (Signed)
While rounding on pt found pt eyes opened and she was moving her arms and trying to pull at lines and tubes. Versed increased to mg/h and received order for soft restraints at this tijme ?

## 2021-09-03 NOTE — ED Notes (Signed)
Verbal report given to Caron Presume RN at this time ?

## 2021-09-03 NOTE — ED Triage Notes (Addendum)
Patient arrived via EMS from home after being found unresponsive by her son. Last known well time was approx 1 hour ago. Per EMS, her son stated that she was ambulatory and A&O at the last well time. On arrival, pt is unresponsive with eye deviation to the left and left sided weakness. Her pupils are pinpoint and approx 51mm. Pt was given 2mg  of intranasal narcan in route. ?

## 2021-09-03 NOTE — ED Notes (Signed)
Pt to CT on monitor with RN and RT.

## 2021-09-03 NOTE — ED Notes (Signed)
Susan Mclean daughter lives in [place] would like calls with updates. [phone] ?

## 2021-09-03 NOTE — ED Notes (Addendum)
BIB [facility] from [facility] as a transfer. Pt LKW 2300 09/02/21 at 0000 on 09/03/21 son went to the house and found her unresponsive. Narcan 2mg  IN administered. At the hosp pt was unresponsive with lt gaze nystagmus. Concern for seizures. Pt intubated for airway protection. Originally was being transferred for EEG however accepting provider here said to call a Code CVA. Pt has a hx of CVA but no deficits from previous CVA. Pt had a CTA neck and head, Ct head, CT Venogram only showed old infarct, hx of A-fib, pt was on a Cardizem drip but converted to NSR so it was d/c. Pt is now on a Propofol 33mcg/kg/min, Versed 1mg /h. Currently SB on the monitor. Has been  moving a little bit and it looks like weakness with lt side. She received fentanyl 157mcg from EMS. Was advised that if I incresed the Prop and the Versed her BP will decrease. PIV: 22ga RtFA, 20ga Rt AC, 20ga Rt UA, 22ga LtUA, 18ga LtAC. Ett 7, marked 22 at the lip, Temp Foley in place 79fr, Og tube in place. Previous facility attempted to reach son however they were unable to do so after multiple calls.  ? ? ?

## 2021-09-03 NOTE — Progress Notes (Signed)
Dr. Carron Brazen joined teleneuro cart ?

## 2021-09-03 NOTE — ED Notes (Signed)
Made ED provider aware of pt current heart rate ?

## 2021-09-03 NOTE — ED Notes (Addendum)
CODE STROKE ACTIVATED, TELENEUROLOGY being used presently with another patient. Dr. Alfred Levins aware ?

## 2021-09-03 NOTE — Progress Notes (Addendum)
Initial Nutrition Assessment ? ?DOCUMENTATION CODES:  ? ?Not applicable ? ?INTERVENTION:  ? ?Initiate tube feeding via OG tube (after tube advanced and tip verified to be in the stomach): ?Vital 1.5 at 50 ml/h (1200 ml per day) ?Prosource TF 45 ml TID ? ?Provides 1920 kcal (2136 kcal total with propofol), 114 gm protein, 917 ml free water daily ? ?NUTRITION DIAGNOSIS:  ? ?Inadequate oral intake related to inability to eat as evidenced by NPO status. ? ?GOAL:  ? ?Patient will meet greater than or equal to 90% of their needs ? ?MONITOR:  ? ?Vent status, TF tolerance, Labs ? ?REASON FOR ASSESSMENT:  ? ?Ventilator, Consult ?Enteral/tube feeding initiation and management ? ?ASSESSMENT:  ? ?71 yo female admitted to [facility] with unresponsiveness, L side eye deviation, L weakness, and nystagmus. Intubated at [facility]. Transferred to [facility] for Neurology care with seizure like activity and respiratory failure. PMH includes A fib, HF, HTN, DM-2, CAD, CKD. ? ?RD working remotely. ?Received MD Consult for TF initiation and management. ?OG tube in place. Per chest x-ray tube tip is in the esophagus. Notified RN that tube needs to be advanced and x-rayed to make sure tip is in the stomach prior to starting TF. ? ?Patient is currently intubated on ventilator support ?MV: 6.4 L/min ?Temp (24hrs), Avg:96 ?F (35.6 ?C), Min:95.3 ?F (35.2 ?C), Max:97.9 ?F (36.6 ?C) ? ?Propofol: 8.2 ml/hr providing 216 kcal from lipid. ? ?Labs reviewed. K 5.2 ?CBG: 123-95 ? ?Medications reviewed and include Colace, Novolog, Keppra, Miralax, Versed, propofol. ? ?Weight history reviewed. No significant weight changes noted.  ? ?NUTRITION - FOCUSED PHYSICAL EXAM: ? ?Unable to complete ? ?Diet Order:   ?Diet Order   ? ?       ?  Diet NPO time specified  Diet effective now       ?  ? ?  ?  ? ?  ? ? ?EDUCATION NEEDS:  ? ?No education needs have been identified at this time ? ?Skin:  Skin Assessment: Reviewed RN Assessment ? ?Last BM:  no BM documented ? ?Height:  ? ?Ht  Readings from Last 1 Encounters:  ?09/03/21 5\' 1"  (1.549 m)  ? ? ?Weight:  ? ?Wt Readings from Last 1 Encounters:  ?09/03/21 91.2 kg  ? ? ? ?BMI:  Body mass index is 37.99 kg/m?. ? ?Estimated Nutritional Needs:  ? ?Kcal:  1800-2000 ? ?Protein:  100-120 gm ? ?Fluid:  1.8-2 L ? ? ? ?Lucas Mallow RD, LDN, CNSC ?Please refer to Amion for contact information.                                                       ? ?

## 2021-09-03 NOTE — Consult Note (Addendum)
NEUROLOGY CONSULTATION NOTE  ? ?Date of service: September 03, 2021 ?Patient Name: Susan Mclean ?MRN:  [id number] ?DOB:  1951-02-20 ?Reason for consult: "L gaze deviation and L head turn, Left nystagmus along with unresponsiveness concerning for a seizure" ?Requesting Provider: No att. providers found ?_ _ _   _ __   _ __ _ _  __ __   _ __   __ _ ? ?History of Present Illness  ?Susan Mclean is a 71 y.o. female with PMH significant for atrial fibrillation, DVT on Eliquis, history of hypertension, history of MI, history of prior bilateral PCA strokes with bilateral PCA territory encephalomalacia with right worse than left. ? ?History is very limited secondary to patient being intubated and sedated on arrival to [facility].  Per report, patient went to bed at her baseline with a last known normal of 2300 on 09/02/2021.  She was found in the bed unresponsive with left head tilt, left gaze deviation, left-sided weakness and nystagmus.  When she presented to [facility] ED she still have persistent left gaze deviation and was unresponsive.  She was intubated with etomidate and rocuronium and started on propofol.  She was given Keppra 2000 mg IV once.  She had CT head without contrast which demonstrated prior bilateral PCA strokes with right worse than left.  She got CT angio head and neck which was negative for large vessel occlusion.  CT venogram with no dural sinus venous thrombosis. ? ?Her presentation was very concerning for a potential status epilepticus and she was transferred to [facility] for a continuous EEG and for neurology evaluation.  Per team at [facility], when her sedation is cut down, she was noted to be moving her right upper extremity and right lower extremity more so than left upper extremity and left lower extremity. ? ?LKW: 2300 on 09/02/21 ?mRS: unknown ?tNKASE: not offered as team at [facility] was unable to reach son to clarify last eliquis dose. ?Thrombectomy: not offered, no  LVO. ?NIHSS components Score: Comment  ?1a Level of Conscious 0[]  1[]  2[]  3[x]      ?1b LOC Questions 0[]  1[]  2[x]       ?1c LOC Commands 0[]  1[]  2[x]       ?2 Best Gaze 0[x]  1[]  2[]       ?3 Visual 0[x]  1[]  2[]  3[]      ?4 Facial Palsy 0[x]  1[]  2[]  3[]      ?5a Motor Arm - left 0[]  1[]  2[]  3[x]  4[]  UN[]    ?5b Motor Arm - Right 0[]  1[]  2[]  3[x]  4[]  UN[]    ?6a Motor Leg - Left 0[]  1[]  2[]  3[x]  4[]  UN[]    ?6b Motor Leg - Right 0[]  1[]  2[]  3[x]  4[]  UN[]    ?7 Limb Ataxia 0[x]  1[]  2[]  3[]  UN[]     ?8 Sensory 0[x]  1[]  2[]  UN[]      ?9 Best Language 0[]  1[]  2[]  3[x]      ?10 Dysarthria 0[]  1[]  2[x]  UN[]      ?11 Extinct. and Inattention 0[x]  1[]  2[]       ?TOTAL: 24   ? ?  ?ROS  ? ?Unable to obtain detailed review of system due to intubation and sedation. ? ?Past History  ? ?Past Medical History:  ?Diagnosis Date  ? Atrial fibrillation ([place])   ? CHF (congestive heart failure) ([place])   ? DVT (deep venous thrombosis) ([place])   ? Left lower extremity  ? HTN (hypertension)   ? Hypertension   ? MI (myocardial infarction) ([place])   ? ?Past  Surgical History:  ?Procedure Laterality Date  ? CORONARY STENT INTERVENTION N/A 03/09/2021  ? Procedure: CORONARY STENT INTERVENTION;  Surgeon: Nelva Bush, MD;  Location: [facility];  Service: Cardiovascular;  Laterality: N/A;  ? CORONARY STENT PLACEMENT    ? LEFT HEART CATH AND CORONARY ANGIOGRAPHY N/A 03/09/2021  ? Procedure: LEFT HEART CATH AND CORONARY ANGIOGRAPHY;  Surgeon: Nelva Bush, MD;  Location: [facility];  Service: Cardiovascular;  Laterality: N/A;  ? ?No family history on file. ?Social History  ? ?Socioeconomic History  ? Marital status: Unknown  ?  Spouse name: Not on file  ? Number of children: Not on file  ? Years of education: Not on file  ? Highest education level: Not on file  ?Occupational History  ? Not on file  ?Tobacco Use  ? Smoking status: Never  ? Smokeless tobacco: Never  ?Substance and Sexual Activity  ? Alcohol use: Not Currently  ? Drug use: Never   ? Sexual activity: Not on file  ?Other Topics Concern  ? Not on file  ?Social History Narrative  ? Not on file  ? ?Social Determinants of Health  ? ?Financial Resource Strain: Not on file  ?Food Insecurity: Not on file  ?Transportation Needs: Not on file  ?Physical Activity: Not on file  ?Stress: Not on file  ?Social Connections: Not on file  ? ?No Known Allergies ? ?Medications  ?(Not in a hospital admission) ?  ? ?Vitals  ? ?Vitals:  ? 09/03/21 0315 09/03/21 0320  ?BP: (!) 116/54   ?Pulse: (!) 51 (!) 45  ?Resp: 16 14  ?Temp: (!) 95.4 ?F (35.2 ?C) (!) 95.4 ?F (35.2 ?C)  ?SpO2: 100% 100%  ?  ? ?There is no height or weight on file to calculate BMI. ? ?Physical Exam  ? ?General: Laying comfortably in bed; intubated. ?HENT: Normal oropharynx and mucosa. Normal external appearance of ears and nose.  ?Neck: Supple, no pain or tenderness  ?CV: No JVD. No peripheral edema.  ?Pulmonary: Symmetric Chest rise.  Breathing over went  ?abdomen: round, soft to touch, non-tender.  ?Ext: No cyanosis, edema, or deformity ?Skin: No rash. Normal palpation of skin.   ?Musculoskeletal: Normal digits and nails by inspection. No clubbing.  ? ?Neurologic Examination  ?Mental status/Cognition: No response to loud voice.  To tactile stimulation to noxious stimuli, she would grimace and have some minimal movement in all extremities.  Speech/language: Unable to assess secondary to intubation.  No attempts to communicate.  Does not follow commands. ? ?Cranial nerves:  ? CN II Pupils equal and reactive to light, unable to assess her visual field deficit.  ? CN III,IV,VI EOM intact to doll's eye, no gaze preference or deviation at this time, no nystagmus  ? CN V Corneal reflexes intact bilaterally  ? CN VII Symmetric facial grimace.  ? CN VIII DOES NOT TURN HEAD TOWARDS SPEECH  ? CN IX & X Coughand gag intact  ? CN XI Head midline  ? CN XII midline tongue but does not protrude on command.  ? ?Motor/sensory:  ?Muscle bulk: normal, tone flaccid  in all extremities. ?She withdraws all extremities to proximal pinch with associated facial grimace. ? ?Coordination/Complex Motor:  ?Unable to assess. ?- Gait: Unsafe to assess. ?Labs  ? ?CBC:  ?Recent Labs  ?Lab 09/02/21 ?2351  ?WBC 10.2  ?NEUTROABS 3.4  ?HGB 11.9*  ?HCT 38.9  ?MCV 90.7  ?PLT 145*  ? ? ?Basic Metabolic Panel:  ?Lab Results  ?Component  Value Date  ? NA 141 09/02/2021  ? K 5.0 09/02/2021  ? CO2 15 (L) 09/02/2021  ? GLUCOSE 207 (H) 09/02/2021  ? BUN 31 (H) 09/02/2021  ? CREATININE 2.28 (H) 09/02/2021  ? CALCIUM 9.2 09/02/2021  ? GFRNONAA 23 (L) 09/02/2021  ? ?Lipid Panel:  ?Lab Results  ?Component Value Date  ? [place] 60 03/10/2021  ? ?HgbA1c:  ?Lab Results  ?Component Value Date  ? HGBA1C 6.2 (H) 03/09/2021  ? ?Urine Drug Screen:  ?   ?Component Value Date/Time  ? [place] DETECTED 09/03/2021 0033  ? [place] DETECTED 09/03/2021 0033  ? [place] DETECTED 09/03/2021 0033  ? AMPHETMU NONE DETECTED 09/03/2021 0033  ? [place] DETECTED 09/03/2021 0033  ? [place] DETECTED 09/03/2021 0033  ?  ?Alcohol Level  ?   ?Component Value Date/Time  ? ETH <10 09/02/2021 2351  ? ? ?CT Head without contrast(Personally reviewed): ?No acute intracranial process. Redemonstrated areas of ?encephalomalacia in the right-greater-than-left PCA territory. ? ?CT angio Head and Neck with contrast and CT Venogram(Personally reviewed): ?1. Short segment severe stenosis or occlusion in the distal right ?P1/proximal right P2, of indeterminate acuity given the patient's ?history of prior right PCA territory. ?2. Moderate stenosis in the bilateral cavernous and supraclinoid ?segments. ?3.  No hemodynamically significant stenosis in the neck. ?4. No evidence of dural venous sinus stenosis or thrombosis. ?5. Mild enlargement of the main pulmonary artery, as can be seen ?with in the setting of pulmonary hypertension. ? ? ?MRI Brain: ?pending ? ?cEEG:  ?pending ? ?Impression  ? ?Joene Gelder is a 71 y.o. female  with PMH significant for atrial fibrillation, DVT on Eliquis, history of hypertension, history of MI, history of prior bilateral PCA strokes with bilateral PCA territory encephalomalacia with right worse than

## 2021-09-03 NOTE — ED Notes (Signed)
Lab at bedside at this time.  

## 2021-09-03 NOTE — ED Notes (Signed)
Teleneurology at the bedside with Dr. Alfred Levins ?

## 2021-09-03 NOTE — Procedures (Signed)
Routine EEG Report ? ?Susan Mclean is a 71 y.o. female with a history of seizure who is undergoing an EEG to evaluate for seizures. ? ?Report: This EEG was acquired with electrodes placed according to the International 10-20 electrode system (including Fp1, Fp2, F3, F4, C3, C4, P3, P4, O1, O2, T3, T4, T5, T6, A1, A2, Fz, Cz, Pz). The following electrodes were missing or displaced: none. ? ?The background was burst suppression pattern with bursts of theta or alpha range activity lasting 6-8 seconds alternating with periods of diffuse suppression lasting 1-2 seconds. This activity was reactive to stimulation. No sleep architecture was identified. There was no focal slowing. There were no interictal epileptiform discharges. There were no electrographic seizures identified. Photic stimulation and hyperventilation were not performed.  ? ?Impression and clinical correlation: This EEG was obtained while sedated and is abnormal due to burst suppression pattern reflecting global cerebral dysfunction, medication effect, or both. No seizures were captured during this recording. ? ?Su Monks, MD ?[facility] ?[phone] ? ?If 7pm- 7am, please page neurology on call as listed in [place]. ? ?

## 2021-09-03 NOTE — Progress Notes (Signed)
Pt transported to CT x2 on vent. No issues. Pt back in ED room.  ?

## 2021-09-03 NOTE — ED Notes (Addendum)
ED Provider at bedside. 

## 2021-09-03 NOTE — ED Notes (Signed)
Bear hugger placed on pt at this time

## 2021-09-04 ENCOUNTER — Inpatient Hospital Stay (HOSPITAL_COMMUNITY): Payer: 59

## 2021-09-04 DIAGNOSIS — R569 Unspecified convulsions: Secondary | ICD-10-CM

## 2021-09-04 LAB — BASIC METABOLIC PANEL
Anion gap: 4 — ABNORMAL LOW (ref 5–15)
BUN: 24 mg/dL — ABNORMAL HIGH (ref 8–23)
CO2: 19 mmol/L — ABNORMAL LOW (ref 22–32)
Calcium: 8.8 mg/dL — ABNORMAL LOW (ref 8.9–10.3)
Chloride: 121 mmol/L — ABNORMAL HIGH (ref 98–111)
Creatinine, Ser: 1.98 mg/dL — ABNORMAL HIGH (ref 0.44–1.00)
GFR, Estimated: 27 mL/min — ABNORMAL LOW (ref >60)
Glucose, Bld: 155 mg/dL — ABNORMAL HIGH (ref 70–99)
Potassium: 4.6 mmol/L (ref 3.5–5.1)
Sodium: 144 mmol/L (ref 135–145)

## 2021-09-04 LAB — CBC
HCT: 30.4 % — ABNORMAL LOW (ref 36.0–46.0)
Hemoglobin: 10.2 g/dL — ABNORMAL LOW (ref 12.0–15.0)
MCH: 29.1 pg (ref 26.0–34.0)
MCHC: 33.6 g/dL (ref 30.0–36.0)
MCV: 86.6 fL (ref 80.0–100.0)
Platelets: 102 10*3/uL — ABNORMAL LOW (ref 150–400)
RBC: 3.51 MIL/uL — ABNORMAL LOW (ref 3.87–5.11)
RDW: 14.8 % (ref 11.5–15.5)
WBC: 8.4 10*3/uL (ref 4.0–10.5)
nRBC: 0 % (ref 0.0–0.2)

## 2021-09-04 LAB — HEPARIN LEVEL (UNFRACTIONATED): Heparin Unfractionated: 0.77 IU/mL — ABNORMAL HIGH (ref 0.30–0.70)

## 2021-09-04 LAB — GLUCOSE, CAPILLARY
Glucose-Capillary: 124 mg/dL — ABNORMAL HIGH (ref 70–99)
Glucose-Capillary: 134 mg/dL — ABNORMAL HIGH (ref 70–99)
Glucose-Capillary: 141 mg/dL — ABNORMAL HIGH (ref 70–99)
Glucose-Capillary: 149 mg/dL — ABNORMAL HIGH (ref 70–99)
Glucose-Capillary: 149 mg/dL — ABNORMAL HIGH (ref 70–99)
Glucose-Capillary: 155 mg/dL — ABNORMAL HIGH (ref 70–99)

## 2021-09-04 LAB — TSH: TSH: 0.472 u[IU]/mL (ref 0.350–4.500)

## 2021-09-04 LAB — VITAMIN B12: Vitamin B-12: 228 pg/mL (ref 180–914)

## 2021-09-04 LAB — APTT: aPTT: 61 seconds — ABNORMAL HIGH (ref 24–36)

## 2021-09-04 LAB — MAGNESIUM: Magnesium: 1.8 mg/dL (ref 1.7–2.4)

## 2021-09-04 LAB — TRIGLYCERIDES: Triglycerides: 34 mg/dL (ref <150)

## 2021-09-04 IMAGING — CT CT HEAD W/O CM
3 series · 15 of 47 positions shown, 18 images · non-contrast
Comparison: [DATE]. Brain MRI [DATE].

CLINICAL DATA: 70-year-old female is unresponsive. Intubated. Right
PCA short segment occlusion or severe stenosis on CTA and CTP
yesterday.



[Series 4: head 5.0 h30s · axial · 0.42mm/px · z∈[+1112,+1242]mm · 9 of 32 slices shown, 12 images]
[im 3/32  brain]
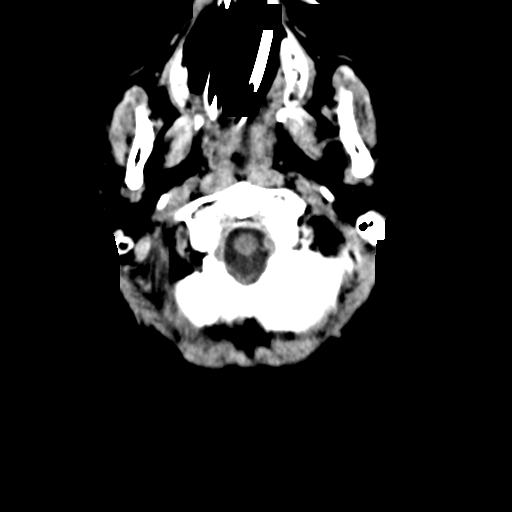
[im 3/32  bone]
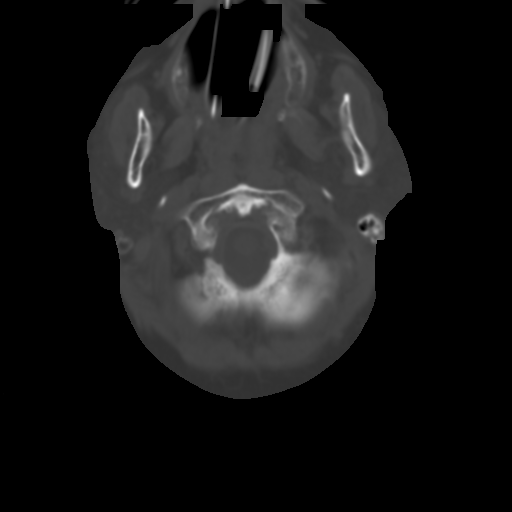
[im 6/32  brain]
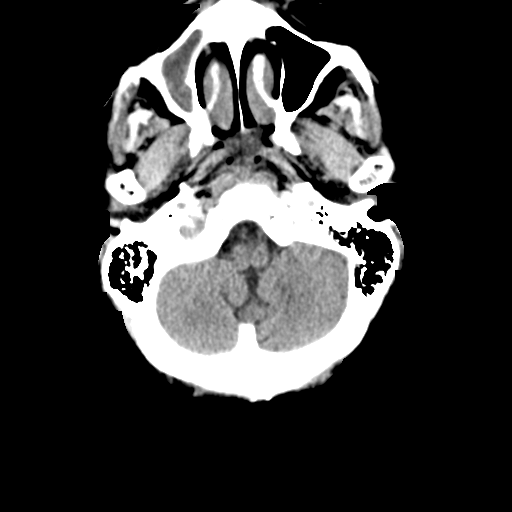
[im 9/32  brain]
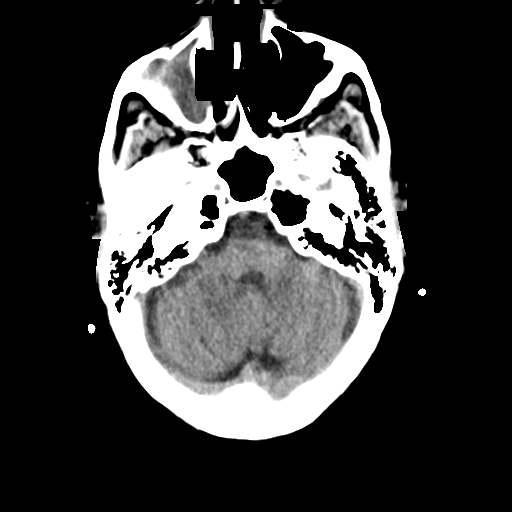
[im 12/32  brain]
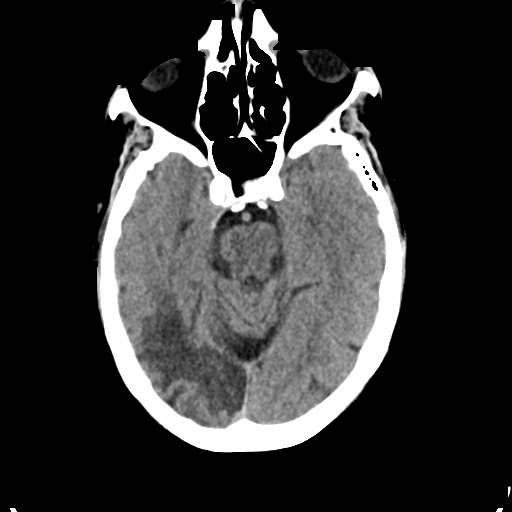
[im 17/32  brain]
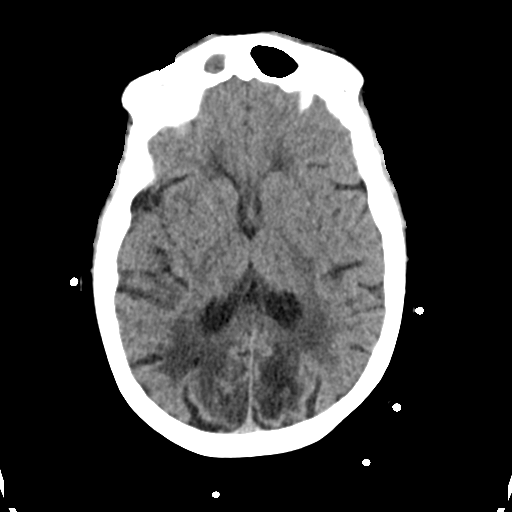
[im 17/32  bone]
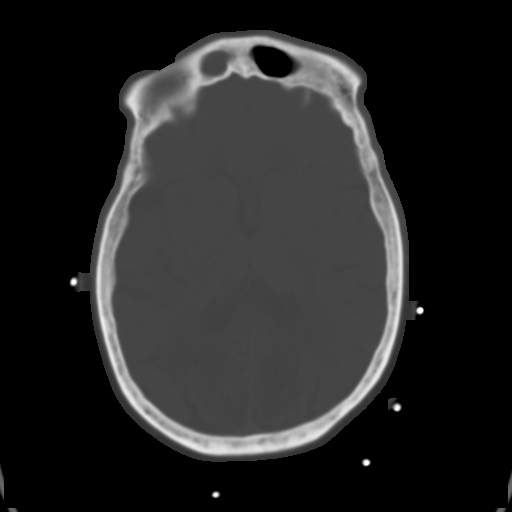
[im 20/32  brain]
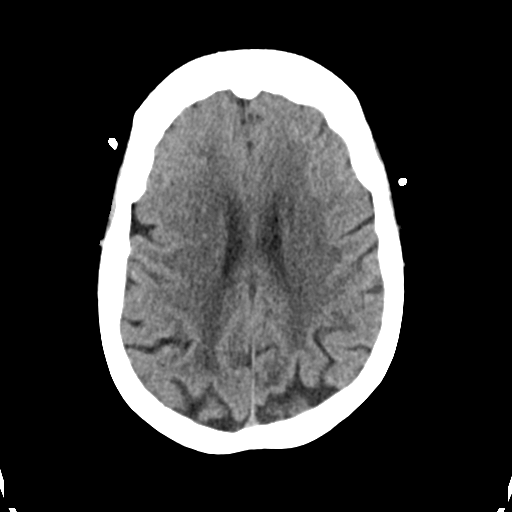
[im 23/32  brain]
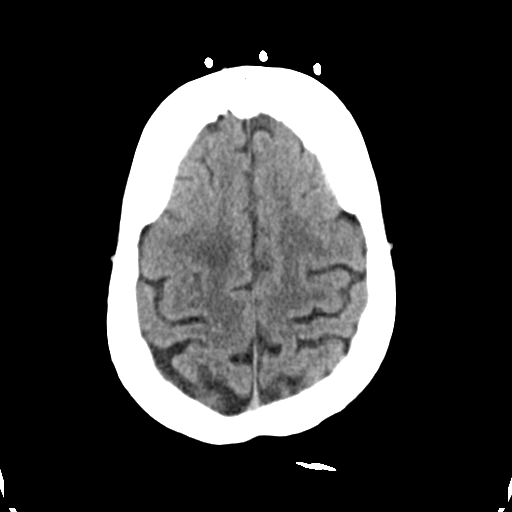
[im 26/32  brain]
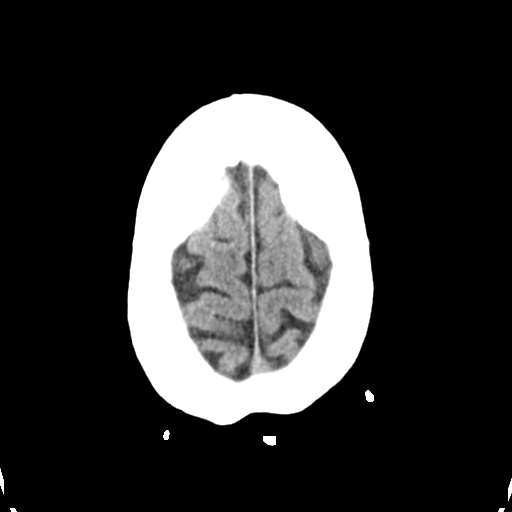
[im 29/32  brain]
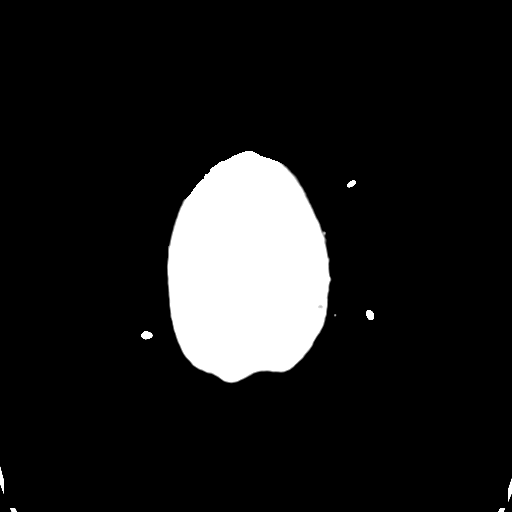
[im 29/32  bone]
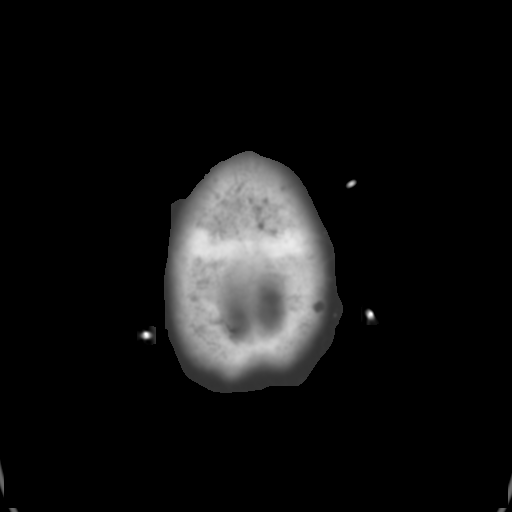

[Series 5: head 3.0 mpr cor · coronal · 0.30mm/px · 3 of 68 slices shown]
[im 23/68  brain]
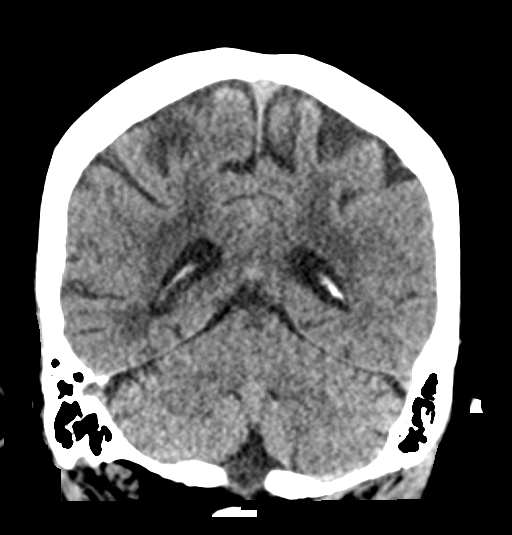
[im 30/68  brain]
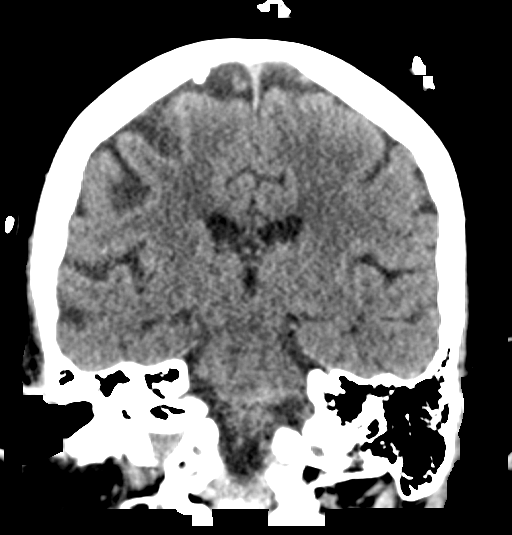
[im 38/68  brain]
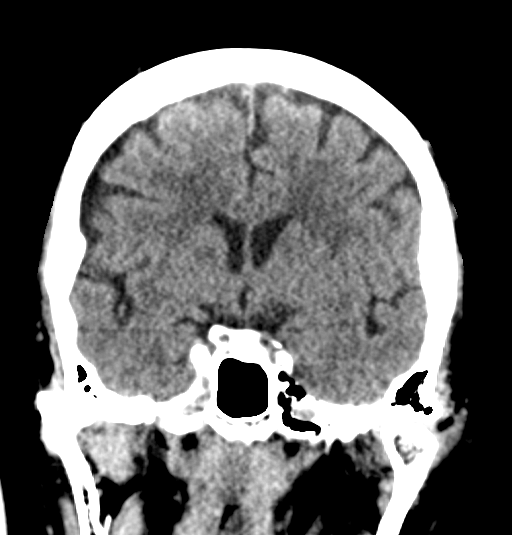

[Series 6: head 3.0 mpr sag · sagittal · 0.31mm/px · 3 of 51 slices shown]
[im 17/51  brain]
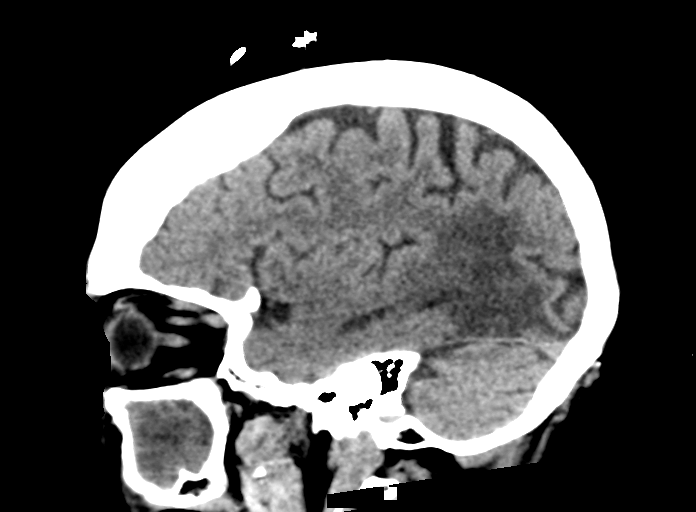
[im 26/51  brain]
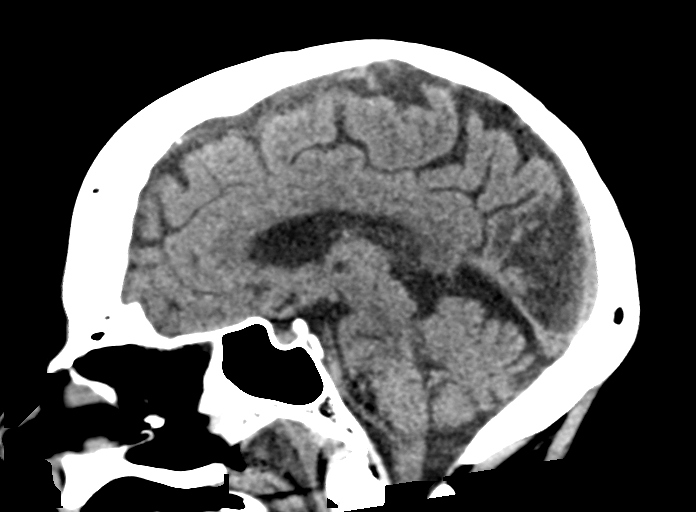
[im 34/51  brain]
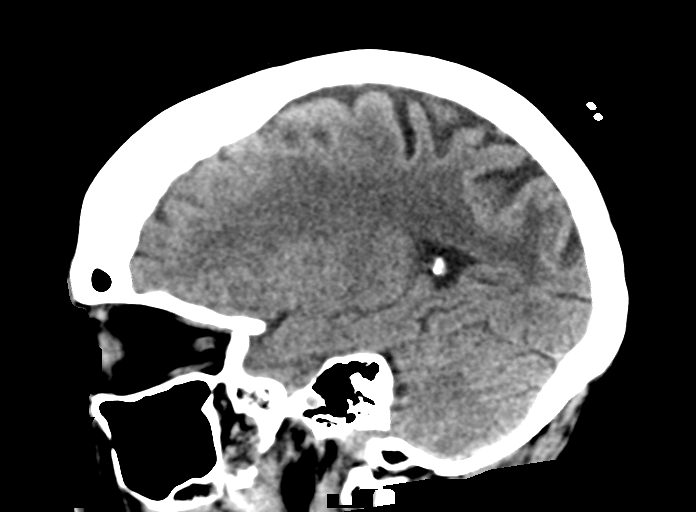

[15 of 47 positions shown; findings below may reference images not displayed]

FINDINGS: Brain: Chronic encephalomalacia right PCA territory and posterior
left parietal lobe a is stable. Chronic small vessel disease in the
deep gray nuclei appears stable.

No midline shift, ventriculomegaly, mass effect, evidence of mass
lesion, intracranial hemorrhage or evidence of cortically based
acute infarction.

Vascular: Calcified atherosclerosis at the skull base.

Skull: No acute osseous abnormality identified.

Sinuses/Orbits: Stable scattered paranasal sinus opacification and
mucosal thickening in the setting of intubation. Some chronic sinus
mucoperiosteal thickening. Tympanic cavities, hyperplastic petrous
apex and mastoid air cells remain clear.

Other: EEG leads in place. No acute orbit or scalp soft tissue
finding. Intubated, oral enteric tube visible.
IMPRESSION: Stable CT appearance of chronic large and small vessel ischemia. No
acute intracranial abnormality.

## 2021-09-04 MED ORDER — AMIODARONE IV BOLUS ONLY 150 MG/100ML
150.0000 mg | Freq: Once | INTRAVENOUS | Status: AC
  Administered 2021-09-04: 150 mg via INTRAVENOUS
  Filled 2021-09-04: qty 100

## 2021-09-04 MED ORDER — DILTIAZEM HCL 25 MG/5ML IV SOLN
10.0000 mg | Freq: Once | INTRAVENOUS | Status: DC
Start: 2021-09-04 — End: 2021-09-04

## 2021-09-04 MED ORDER — CLOPIDOGREL BISULFATE 75 MG PO TABS
75.0000 mg | ORAL_TABLET | Freq: Every day | ORAL | Status: DC
  Administered 2021-09-04 – 2021-09-06 (×3): 75 mg
  Filled 2021-09-04 (×3): qty 1

## 2021-09-04 MED ORDER — HYDRALAZINE HCL 20 MG/ML IJ SOLN
10.0000 mg | Freq: Four times a day (QID) | INTRAMUSCULAR | Status: DC | PRN
  Administered 2021-09-04: 10 mg via INTRAVENOUS
  Filled 2021-09-04: qty 1

## 2021-09-04 MED ORDER — PROPOFOL 1000 MG/100ML IV EMUL: 5.0000 ug/kg/min | INTRAVENOUS | Status: DC

## 2021-09-04 MED ORDER — DILTIAZEM HCL-DEXTROSE 125-5 MG/125ML-% IV SOLN (PREMIX): 5.0000 mg/h | INTRAVENOUS | Status: DC

## 2021-09-04 MED ORDER — AMIODARONE IV BOLUS ONLY 150 MG/100ML: 150.0000 mg | Freq: Once | INTRAVENOUS | Status: AC

## 2021-09-04 MED ORDER — AMIODARONE IV BOLUS ONLY 150 MG/100ML
INTRAVENOUS | Status: AC
  Administered 2021-09-04: 150 mg via INTRAVENOUS
  Filled 2021-09-04: qty 100

## 2021-09-04 MED ORDER — HEPARIN (PORCINE) 25000 UT/250ML-% IV SOLN
1250.0000 [IU]/h | INTRAVENOUS | Status: DC
  Administered 2021-09-04: 800 [IU]/h via INTRAVENOUS
  Administered 2021-09-05 – 2021-09-06 (×2): 1050 [IU]/h via INTRAVENOUS
  Administered 2021-09-07: 1250 [IU]/h via INTRAVENOUS
  Filled 2021-09-04 (×4): qty 250

## 2021-09-04 MED ORDER — ASPIRIN 81 MG PO CHEW: 324.0000 mg | CHEWABLE_TABLET | Freq: Every day | ORAL | Status: DC

## 2021-09-04 NOTE — Progress Notes (Signed)
ANTICOAGULATION CONSULT NOTE  ? ?Pharmacy Consult for Heparin  ?Indication: hx atrial fibrillation and DVT  ? ?No Known Allergies ? ?Patient Measurements: ?Height: 5\' 1"  (154.9 cm) ?Weight: 91.2 kg (201 lb 1 oz) ?IBW/kg (Calculated) : 47.8 ? ? ?Vital Signs: ?Temp: 99.3 ?F (37.4 ?C) (04/30 1932) ?BP: 102/54 (04/30 1800) ?Pulse Rate: 76 (04/30 1932) ? ?Labs: ?Recent Labs  ?  09/02/21 ?2351 09/03/21 ?3646 09/03/21 ?8032 09/03/21 ?2026 09/04/21 ?1224 09/04/21 ?8250 09/04/21 ?2156  ?HGB 11.9*  --  9.0* 9.2*  --  10.2*  --   ?HCT 38.9  --  29.2* 27.0*  --  30.4*  --   ?PLT 145*  --  85*  --   --  102*  --   ?APTT 32  --   --   --   --   --  61*  ?LABPROT 14.7  --   --   --   --   --   --   ?INR 1.2  --   --   --   --   --   --   ?HEPARINUNFRC  --   --   --   --   --   --  0.77*  ?CREATININE 2.28* 2.01*  --   --  1.98*  --   --   ? ? ? ?Estimated Creatinine Clearance: 27.2 mL/min (A) (by C-G formula based on SCr of 1.98 mg/dL (H)). ? ? ?Medical History: ?Past Medical History:  ?Diagnosis Date  ? Atrial fibrillation ([place])   ? CHF (congestive heart failure) ([place])   ? DVT (deep venous thrombosis) ([place])   ? Left lower extremity  ? HTN (hypertension)   ? Hypertension   ? MI (myocardial infarction) ([place])   ? ? ? ? ?Assessment: ?70yof with Hx DVT and afib on apixaban pta admitted with mental status change and intubated for airway protection.  Head CT negative for bleed  ?Pharmacy asked to dose heparin while off oral anticoagulation.  Hgb stable pltc 100  ?Will use aptt to dose heparin until apixaban cleared and heparin level correlates with aptt  ? ?Initial aptt tonight at goal.  Heparin level falsely elevated as expected.   ? ?Goal of Therapy:  ?Heparin level 0.3-0.5 ?Aptt 66-85sec ?Monitor platelets by anticoagulation protocol: Yes ?  ?Plan:  ?No bolus ?Increase heparin gtt to 850 units/hr. ?Aptt with AM labs. ?Daily heprin level and aptt and CBC  ?Monitor s/s bleeding ? ?[place] Crane, Pharm D, BCPS, BCCP ?Clinical  Pharmacist ? 09/04/2021 10:40 PM  ? ?[facility] pharmacy phone numbers are listed on [email or web address] ? ? ? ? ?

## 2021-09-04 NOTE — Progress Notes (Signed)
Neurology Progress Note ? ?Patient ID/Initial HPI:  ?Susan Mclean is a 71 y.o. female with PMH significant for paroxysmal atrial fibrillation, recent left lower extremity DVT (07/29/2021) on Eliquis, history of hypertension, history of CAD complicated by MI (times 03/2021), heart failure with preserved EF, CKD stage IV-V, obesity BMI 37.9 history of prior bilateral PCA strokes with bilateral PCA territory encephalomalacia with right worse than left. ?  ?History is very limited secondary to patient being intubated and sedated on arrival to [facility].  Per report, patient went to bed at her baseline with a last known normal of 2300 on 09/02/2021.  She was found in the bed unresponsive with left head tilt, left gaze deviation, left-sided weakness and nystagmus.  When she presented to [facility] ED she still have persistent left gaze deviation and was unresponsive.  She was intubated with etomidate and rocuronium and started on propofol.  She was given Keppra 2000 mg IV once.  She had CT head without contrast which demonstrated prior bilateral PCA strokes with right worse than left.  She got CT angio head and neck which was negative for large vessel occlusion.  CT venogram with no dural sinus venous thrombosis. ?  ?Her presentation was very concerning for a potential status epilepticus and she was transferred to [facility] for a continuous EEG and for neurology evaluation. ? ?Major interval events/Subjective: ?- Burst suppression EEG ? ?Exam: ?Current vital signs: ?BP 138/72   Pulse 70   Temp 98.4 ?F (36.9 ?C)   Resp 16   Ht 5\' 1"  (1.549 m)   Wt 91.2 kg   SpO2 98%   BMI 37.99 kg/m?  ?Vital signs in last 24 hours: ?Temp:  [96.6 ?F (35.9 ?C)-99.1 ?F (37.3 ?C)] 98.4 ?F (36.9 ?C) (04/30 0500) ?Pulse Rate:  [56-76] 70 (04/30 0500) ?Resp:  [14-18] 16 (04/30 0500) ?BP: (91-161)/(54-78) 138/72 (04/30 0500) ?SpO2:  [97 %-100 %] 98 % (04/30 0500) ?FiO2 (%):  [40 %] 40 % (04/30 0305) ?Weight:   [91.2 kg] 91.2 kg (04/29 0937) ? ? ?Gen: In bed, intubated with EEG in place ?Resp: Comfortable on the ventilator ?Cardiac: Perfusing extremities well, sinus rhythm on monitor ?Abd: soft, nt ? ?Neuro: ?MS: Does not open eyes to noxious stimulation, does not follow any commands. ?CN: Pupils equal round reactive to light, eyes disconjugate with weak VOR, corneals intact to saline stimulation, gag and cough present.  Grimace difficult to assess in the setting of ET tube and EEG in place ?Motor/Sensory: 1/5 throughout, more brisk on the right side than the left, does attempt to localize with the right upper extremity ?DTR: Absent patellar, brachioradialis ?Plantar reflexes mute ? ?Pertinent Labs: ? ?Basic Metabolic Panel: ?Recent Labs  ?Lab 09/02/21 ?2351 09/03/21 ?9147 09/03/21 ?2026 09/04/21 ?8295  ?NA 141 140 146* 144  ?K 5.0 5.2* 4.8 4.6  ?CL 114* 120*  --  121*  ?CO2 15* 14*  --  19*  ?GLUCOSE 207* 115*  --  155*  ?BUN 31* 27*  --  24*  ?CREATININE 2.28* 2.01*  --  1.98*  ?CALCIUM 9.2 8.7*  --  8.8*  ?MG  --  1.8  --  1.8  ?PHOS  --  3.4  --   --   ? ?CBC: ?Recent Labs  ?Lab 09/02/21 ?2351 09/03/21 ?0650 09/03/21 ?2026  ?WBC 10.2 5.5  --   ?NEUTROABS 3.4  --   --   ?HGB 11.9* 9.0* 9.2*  ?HCT 38.9 29.2* 27.0*  ?MCV  90.7 93.6  --   ?PLT 145* 85*  --   ? ? ?Coagulation Studies: ?Recent Labs  ?  09/02/21 ?2351  ?LABPROT 14.7  ?INR 1.2  ?  ? ? ?Impression: Patient with prior history of stroke, now in burst suppression after receiving multiple AEDs on sedation with Versed and propofol.  Initial presentation may be explained by brainstem stroke, versus seizures, versus other toxic/metabolic process given dropping platelet count ? ?Recommendations: ?- B12, thiamine, TSH  ?- Stop propofol ?- We will wean Versed aggressively today ?- Continue long-term EEG monitoring ?- Repeat head CT today while awaiting MRI (which cannot be obtained until we discontinue long-term EEG) ?- Pending CT results, I will either start aspirin or  if primary team is comfortable, anticoagulation ? -AM CBC clotted, pending repeat to confirm platelet count is not dropping further ?- Appreciate management of comorbidities on ventilator per CCM team ? ?Lesleigh Noe MD-PhD ?[facility] ?[phone]  ? ?CRITICAL CARE ?Performed by: Lorenza Chick ? ? ?Total critical care time: 45 minutes ? ?Critical care time was exclusive of separately billable procedures and treating other patients. ? ?Critical care was necessary to treat or prevent imminent or life-threatening deterioration. ? ?Critical care was time spent personally by me on the following activities: development of treatment plan with patient and/or surrogate as well as nursing, discussions with consultants, evaluation of patient's response to treatment, examination of patient, obtaining history from patient or surrogate, ordering and performing treatments and interventions, ordering and review of laboratory studies, ordering and review of radiographic studies, pulse oximetry and re-evaluation of patient's condition. ? ? ?

## 2021-09-04 NOTE — Progress Notes (Signed)
ANTICOAGULATION CONSULT NOTE - Initial Consult ? ?Pharmacy Consult for Heparin  ?Indication: atrial fibrillation and DVT - Hx of ? ?No Known Allergies ? ?Patient Measurements: ?Height: 5\' 1"  (154.9 cm) ?Weight: 91.2 kg (201 lb 1 oz) ?IBW/kg (Calculated) : 47.8 ? ? ?Vital Signs: ?Temp: 98.4 ?F (36.9 ?C) (04/30 1000) ?BP: 105/67 (04/30 1000) ?Pulse Rate: 76 (04/30 1000) ? ?Labs: ?Recent Labs  ?  09/02/21 ?2351 09/03/21 ?1937 09/03/21 ?9024 09/03/21 ?2026 09/04/21 ?0973 09/04/21 ?0941  ?HGB 11.9*  --  9.0* 9.2*  --  10.2*  ?HCT 38.9  --  29.2* 27.0*  --  30.4*  ?PLT 145*  --  85*  --   --  102*  ?APTT 32  --   --   --   --   --   ?LABPROT 14.7  --   --   --   --   --   ?INR 1.2  --   --   --   --   --   ?CREATININE 2.28* 2.01*  --   --  1.98*  --   ? ? ?Estimated Creatinine Clearance: 27.2 mL/min (A) (by C-G formula based on SCr of 1.98 mg/dL (H)). ? ? ?Medical History: ?Past Medical History:  ?Diagnosis Date  ? Atrial fibrillation ([place])   ? CHF (congestive heart failure) ([place])   ? DVT (deep venous thrombosis) ([place])   ? Left lower extremity  ? HTN (hypertension)   ? Hypertension   ? MI (myocardial infarction) ([place])   ? ? ? ? ?Assessment: ?70yof with Hx DVT and afib on apixaban pta admitted with mental status change and intubated for airway protection.  Head CT negative for bleed  ?Pharmacy asked to dose heparin while off oral anticoagulation.  Hgb stable pltc 100  ?Will use aptt to dose heparin until apixaban cleared and heparin level correlates with aptt  ? ?Goal of Therapy:  ?Heparin level 0.3-0.5 ?Aptt 66-85sec ?Monitor platelets by anticoagulation protocol: Yes ?  ?Plan:  ?No bolus ?Heparin drip 800 uts/hr  ?Aptt and heparin level 6hr after start ?Daily heprin level and aptt and CBC  ?Monitor s/s bleeding ? ? ?Bonnita Nasuti Pharm.D. CPP, BCPS ?Clinical Pharmacist ?[phone] ?09/04/2021 11:36 AM  ? ? ? ?

## 2021-09-04 NOTE — Procedures (Signed)
Patient Name: Susan Mclean  ?MRN: [id number]  ?Epilepsy Attending: Lora Havens  ?Referring Physician/Provider: Donnetta Simpers, MD ?Duration: 09/03/2021 3403 to 09/04/2021 5248 ? ?Patient history: 70 y.o. female with PMH significant for atrial fibrillation, DVT on Eliquis, history of hypertension, history of MI, history of prior bilateral PCA strokes with bilateral PCA territory encephalomalacia with right worse than left who went to bed at her baseline with a last known normal of 2300 on 09/02/2021.  She was found in the bed unresponsive with left head tilt, left gaze deviation, left-sided weakness and nystagmus. EEG to evaluate for seizure ? ?Level of alertness: comatose ? ?AEDs during EEG study: LEV, propofol, versed ? ?Technical aspects: This EEG study was done with scalp electrodes positioned according to the 10-20 International system of electrode placement. Electrical activity was acquired at a sampling rate of 500Hz  and reviewed with a high frequency filter of 70Hz  and a low frequency filter of 1Hz . EEG data were recorded continuously and digitally stored.  ? ?Description: EEG initially showed burst attenuation pattern with bursts of sharply contoured 8-10Hz  alpha activity lasting 6-8 seconds admixed with 13- seconds og generalized eeg attenuation. Gradually EEG showed continuous generalized polymorphic disorganized 5-11hz  theta-alpha activity admixed with low amplitude 13-15hz  generalized beta activity. Hyperventilation and photic stimulation were not performed.    ? ?ABNORMALITY ?- Continuous slow, generalized ? ?IMPRESSION: ?This study is suggestive of moderate to severe diffuse encephalopathy, nonspecific etiology but likely related to sedation. No seizures or epileptiform discharges were seen throughout the recording. ? ?Lora Havens  ? ?

## 2021-09-04 NOTE — Progress Notes (Signed)
PCCM Progress Note ?AFRVR on tele with borderline hypotension. ?Amio 150 mg x 2 given with improvement ?Continue to monitor ?

## 2021-09-04 NOTE — Progress Notes (Signed)
? ?NAME:  Susan Mclean, MRN:  [id number], DOB:  June 03, 1950, LOS: 1 ?ADMISSION DATE:  09/03/2021, CONSULTATION DATE:  09/03/21 ?REFERRING MD:  EDP, CHIEF COMPLAINT:  intubated  ? ?History of Present Illness:  ?71 yo female with pmh of afib on eliquis, h/o dvt, HFpEF, htn, dm2, cad, ntesmi, ckd who presented to [facility] unresponsive and with L sided eye deviation, L weakness and nystagmus. All history obtained from chart as pt is intubated and unresponsive.  ? ?She was reportedly last known normal at 2300. Presented to [facility]. GCS 3 and was emergently intubated. She had spontaneous movement of R and no movement on L. Her L gaze deviation resolved. She was started on propofol and loaded with Keppra. CTH negative for acute cva but showed chronic b/l PCA strokes R >L. Neurology contacted for transfer for LTM and accepted pt. Upon arrival CCM was called after neurology saw pt with concern for status. We were asked to admit pt and they consult.  ? ?Pertinent  Medical History  ?H/o cva ?H/o afib on chronic eliqus ? ?Significant Hospital Events: ?Including procedures, antibiotic start and stop dates in addition to other pertinent events   ?Accepted in transfer by neurology ?Arrived in ed intubated at that time neurology had EDP contact ccm for admission and they became consultant.  ? ?Interim History / Subjective:  ?EEG with no further seizures on current AEDs and IV sedation ?Repeat CT head stable, no acute intracranial abnormalities ?Objective   ?Blood pressure 130/68, pulse 73, temperature 98.1 ?F (36.7 ?C), resp. rate 16, height 5\' 1"  (1.549 m), weight 91.2 kg, SpO2 99 %. ?   ?Vent Mode: PRVC ?FiO2 (%):  [30 %-40 %] 30 % ?Set Rate:  [16 bmp] 16 bmp ?Vt Set:  [400 mL] 400 mL ?PEEP:  [5 cmH20] 5 cmH20 ?Plateau Pressure:  [15 cmH20-16 cmH20] 16 cmH20  ? ?Intake/Output Summary (Last 24 hours) at 09/04/2021 1342 ?Last data filed at 09/04/2021 1200 ?Gross per 24 hour  ?Intake 1151.01 ml  ?Output 860 ml  ?Net 291.01 ml   ? ?Filed Weights  ? 09/03/21 0937  ?Weight: 91.2 kg  ? ?Physical Exam: ?General: Elderly, critically ill-appearing, no acute distress ?HENT: [place], AT, ETT in place ?Eyes: EOMI, no scleral icterus ?Respiratory: Clear to auscultation bilaterally.  No crackles, wheezing or rales ?Cardiovascular: RRR, -M/R/G, no JVD ?GI: BS+, soft, nontender ?Extremities:-Edema,-tenderness ?Neuro: Sedated, PERRL ? ?Resolved Hospital Problem list   ? ? ?Assessment & Plan:  ?Acute hypoxic resp failure:  ?-Full vent support ?-LTVV, 4-8cc/kg IBW with goal Pplat<30 and DP<15 ?-SBT/WUA daily when eligible once sedation is weaned ?-VAP ? ?Acute encephalopathy  ?Seizure like activity ?Repeat CT head stable with NAICA ?EEG with no further seizures ?-Appreciate Neuro input ?-AEDs per Neuro: Jodi Marble, wean Propofol and Versed ?-LTM ? ?HFpEF:  ?-no acute exacerbation ?-bp control ? ?T2dm:  ?-ssi ?-a1c in 11/22 =6.2 ? ?H/o CVA:  ?-Plavix ? ?CKD III-IV: Decreased UOP ?-appears to be baseline renal function at this time.  ?-Monitor UOP/Cr ? ?Metabolic acidosis: improving ?S/p 2 amps bicarb. Normal LA ?-Trend ? ? ?Best Practice (right click and "Reselect all SmartList Selections" daily)  ? ?Diet/type: NPO w/ meds via tube ?DVT prophylaxis: SCD ?GI prophylaxis: PPI ?Lines: N/A ?Foley:  Yes, and it is still needed ?Code Status:  full code ?Last date of multidisciplinary goals of care discussion [pending] ? ?Labs   ?CBC: ?Recent Labs  ?Lab 09/02/21 ?2351 09/03/21 ?0650 09/03/21 ?2026 09/04/21 ?0941  ?WBC 10.2 5.5  --  8.4  ?NEUTROABS 3.4  --   --   --   ?HGB 11.9* 9.0* 9.2* 10.2*  ?HCT 38.9 29.2* 27.0* 30.4*  ?MCV 90.7 93.6  --  86.6  ?PLT 145* 85*  --  102*  ? ? ?Basic Metabolic Panel: ?Recent Labs  ?Lab 09/02/21 ?2351 09/03/21 ?8657 09/03/21 ?2026 09/04/21 ?8469  ?NA 141 140 146* 144  ?K 5.0 5.2* 4.8 4.6  ?CL 114* 120*  --  121*  ?CO2 15* 14*  --  19*  ?GLUCOSE 207* 115*  --  155*  ?BUN 31* 27*  --  24*  ?CREATININE 2.28* 2.01*  --  1.98*  ?CALCIUM 9.2  8.7*  --  8.8*  ?MG  --  1.8  --  1.8  ?PHOS  --  3.4  --   --   ? ?GFR: ?Estimated Creatinine Clearance: 27.2 mL/min (A) (by C-G formula based on SCr of 1.98 mg/dL (H)). ?Recent Labs  ?Lab 09/02/21 ?2351 09/03/21 ?0650 09/03/21 ?0934 09/04/21 ?0941  ?WBC 10.2 5.5  --  8.4  ?LATICACIDVEN  --  1.0 0.9  --   ? ? ?Liver Function Tests: ?Recent Labs  ?Lab 09/02/21 ?2351  ?AST 29  ?ALT 12  ?ALKPHOS 187*  ?BILITOT 0.6  ?PROT 8.2*  ?ALBUMIN 3.7  ? ?No results for input(s): LIPASE, AMYLASE in the last 168 hours. ?No results for input(s): AMMONIA in the last 168 hours. ? ?ABG ?   ?Component Value Date/Time  ? PHART 7.380 09/03/2021 2026  ? PCO2ART 32.1 09/03/2021 2026  ? PO2ART 133 (H) 09/03/2021 2026  ? HCO3 19.0 (L) 09/03/2021 2026  ? TCO2 20 (L) 09/03/2021 2026  ? ACIDBASEDEF 5.0 (H) 09/03/2021 2026  ? O2SAT 99 09/03/2021 2026  ?  ? ?Coagulation Profile: ?Recent Labs  ?Lab 09/02/21 ?2351  ?INR 1.2  ? ? ?Cardiac Enzymes: ?No results for input(s): CKTOTAL, CKMB, CKMBINDEX, TROPONINI in the last 168 hours. ? ?HbA1C: ?Hgb A1c MFr Bld  ?Date/Time Value Ref Range Status  ?09/03/2021 06:50 AM 6.0 (H) 4.8 - 5.6 % Final  ?  Comment:  ?  REPEATED TO VERIFY ?(NOTE) ?Pre diabetes:          5.7%-6.4% ? ?Diabetes:              >6.4% ? ?Glycemic control for   <7.0% ?adults with diabetes ?  ?03/09/2021 06:26 AM 6.2 (H) 4.8 - 5.6 % Final  ?  Comment:  ?  (NOTE) ?Pre diabetes:          5.7%-6.4% ? ?Diabetes:              >6.4% ? ?Glycemic control for   <7.0% ?adults with diabetes ?  ? ? ?CBG: ?Recent Labs  ?Lab 09/03/21 ?1918 09/03/21 ?2348 09/04/21 ?6295 09/04/21 ?2841 09/04/21 ?1152  ?GLUCAP 168* 129* 155* 141* 124*  ? ? ?Critical care time: 45 min  ? ?The patient is critically ill with multiple organ systems failure and requires high complexity decision making for assessment and support, frequent evaluation and titration of therapies, application of advanced monitoring technologies and extensive interpretation of multiple databases.   Independent Critical Care Time: 35 Minutes.  ? ?Rodman Pickle, M.D. ?[facility] ?09/04/2021 1:42 PM  ? ?Please see Amion for pager number to reach on-call Pulmonary and Critical Care Team. ? ? ?  ?

## 2021-09-04 NOTE — Progress Notes (Signed)
Maint and skin check complete.  ?

## 2021-09-05 ENCOUNTER — Inpatient Hospital Stay (HOSPITAL_COMMUNITY): Payer: 59

## 2021-09-05 DIAGNOSIS — J9690 Respiratory failure, unspecified, unspecified whether with hypoxia or hypercapnia: Secondary | ICD-10-CM

## 2021-09-05 LAB — CBC
HCT: 38.6 % (ref 36.0–46.0)
Hemoglobin: 12.5 g/dL (ref 12.0–15.0)
MCH: 28.2 pg (ref 26.0–34.0)
MCHC: 32.4 g/dL (ref 30.0–36.0)
MCV: 86.9 fL (ref 80.0–100.0)
Platelets: 79 10*3/uL — ABNORMAL LOW (ref 150–400)
RBC: 4.44 MIL/uL (ref 3.87–5.11)
RDW: 15 % (ref 11.5–15.5)
WBC: 10.3 10*3/uL (ref 4.0–10.5)
nRBC: 0 % (ref 0.0–0.2)

## 2021-09-05 LAB — GLUCOSE, CAPILLARY
Glucose-Capillary: 148 mg/dL — ABNORMAL HIGH (ref 70–99)
Glucose-Capillary: 172 mg/dL — ABNORMAL HIGH (ref 70–99)
Glucose-Capillary: 182 mg/dL — ABNORMAL HIGH (ref 70–99)
Glucose-Capillary: 154 mg/dL — ABNORMAL HIGH (ref 70–99)
Glucose-Capillary: 156 mg/dL — ABNORMAL HIGH (ref 70–99)

## 2021-09-05 LAB — BASIC METABOLIC PANEL
Anion gap: 9 (ref 5–15)
BUN: 28 mg/dL — ABNORMAL HIGH (ref 8–23)
CO2: 17 mmol/L — ABNORMAL LOW (ref 22–32)
Calcium: 8.4 mg/dL — ABNORMAL LOW (ref 8.9–10.3)
Chloride: 118 mmol/L — ABNORMAL HIGH (ref 98–111)
Creatinine, Ser: 1.98 mg/dL — ABNORMAL HIGH (ref 0.44–1.00)
GFR, Estimated: 27 mL/min — ABNORMAL LOW (ref >60)
Glucose, Bld: 154 mg/dL — ABNORMAL HIGH (ref 70–99)
Potassium: 4.9 mmol/L (ref 3.5–5.1)
Sodium: 144 mmol/L (ref 135–145)

## 2021-09-05 LAB — APTT
aPTT: 42 seconds — ABNORMAL HIGH (ref 24–36)
aPTT: 89 seconds — ABNORMAL HIGH (ref 24–36)

## 2021-09-05 LAB — HEPARIN LEVEL (UNFRACTIONATED): Heparin Unfractionated: 0.64 IU/mL (ref 0.30–0.70)

## 2021-09-05 IMAGING — DX DG CHEST 1V PORT
1 series · 1 of 1 positions shown · non-contrast
Comparison: [DATE]

CLINICAL DATA: Fever

EXAM:
PORTABLE CHEST 1 VIEW

[chest]
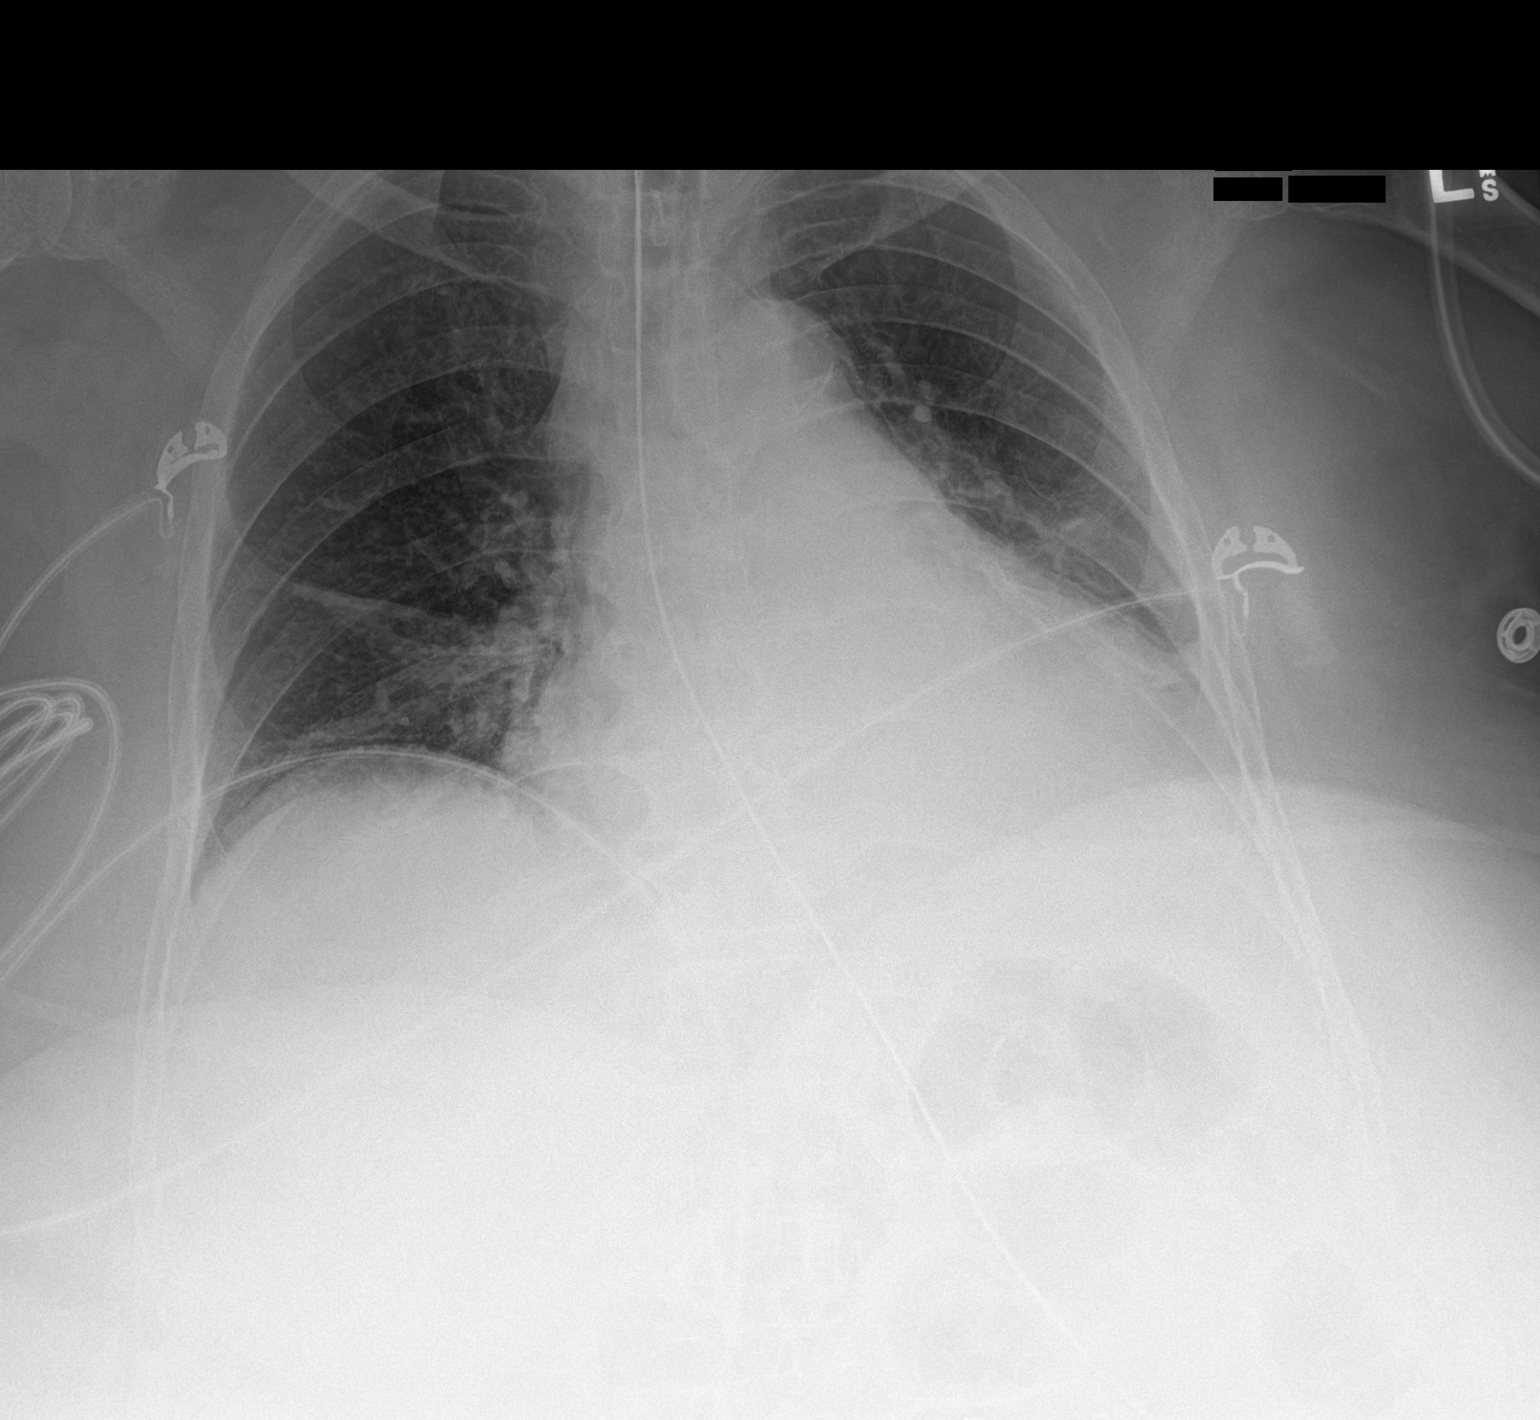

[1 of 1 positions shown; findings below may reference images not displayed]

FINDINGS: Endotracheal tube terminates 5.7 cm above carina. Nasogastric tube
extends beyond the inferior aspect of the film. Midline trachea.
Atherosclerosis in the transverse aorta. Cardiomegaly accentuated by
AP portable technique. No pleural effusion or pneumothorax. Lung
volumes are low. Slight improvement in right infrahilar airspace
disease. Mild right hemidiaphragm elevation. Left lung base not well
evaluated secondary to overlying soft tissues, but no gross
abnormality identified. No congestive failure.
IMPRESSION: Slight improvement in right infrahilar airspace disease.

Cardiomegaly and low lung volumes, without congestive failure.

Advancement of nasogastric tube. Borderline high position of
endotracheal tube. Recommend attention on follow-up.

## 2021-09-05 MED ORDER — AMIODARONE HCL IN DEXTROSE 360-4.14 MG/200ML-% IV SOLN
INTRAVENOUS | Status: AC
  Administered 2021-09-05: 150 mg via INTRAVENOUS
  Filled 2021-09-05: qty 200

## 2021-09-05 MED ORDER — AMIODARONE LOAD VIA INFUSION
150.0000 mg | Freq: Once | INTRAVENOUS | Status: AC
  Filled 2021-09-05: qty 83.34

## 2021-09-05 MED ORDER — METOPROLOL TARTRATE 25 MG PO TABS: 25.0000 mg | ORAL_TABLET | Freq: Two times a day (BID) | ORAL | Status: DC

## 2021-09-05 MED ORDER — AMIODARONE HCL IN DEXTROSE 360-4.14 MG/200ML-% IV SOLN
60.0000 mg/h | INTRAVENOUS | Status: AC
  Administered 2021-09-05: 60 mg/h via INTRAVENOUS

## 2021-09-05 MED ORDER — AMIODARONE HCL IN DEXTROSE 360-4.14 MG/200ML-% IV SOLN
30.0000 mg/h | INTRAVENOUS | Status: DC
  Administered 2021-09-05 – 2021-09-07 (×6): 30 mg/h via INTRAVENOUS
  Filled 2021-09-05 (×6): qty 200

## 2021-09-05 MED ORDER — CYANOCOBALAMIN 1000 MCG/ML IJ SOLN
1000.0000 ug | Freq: Once | INTRAMUSCULAR | Status: AC
  Administered 2021-09-05: 1000 ug via INTRAMUSCULAR
  Filled 2021-09-05: qty 1

## 2021-09-05 NOTE — Procedures (Signed)
Patient Name: Susan Mclean  ?MRN: [id number]  ?Epilepsy Attending: Lora Havens  ?Referring Physician/Provider: Donnetta Simpers, MD ?Duration: 09/04/2021 6433 to 09/05/2021 0901 ?  ?Patient history: 71 y.o. female with PMH significant for atrial fibrillation, DVT on Eliquis, history of hypertension, history of MI, history of prior bilateral PCA strokes with bilateral PCA territory encephalomalacia with right worse than left who went to bed at her baseline with a last known normal of 2300 on 09/02/2021.  She was found in the bed unresponsive with left head tilt, left gaze deviation, left-sided weakness and nystagmus. EEG to evaluate for seizure ?  ?Level of alertness: comatose ?  ?AEDs during EEG study: LEV, versed ?  ?Technical aspects: This EEG study was done with scalp electrodes positioned according to the 10-20 International system of electrode placement. Electrical activity was acquired at a sampling rate of 500Hz  and reviewed with a high frequency filter of 70Hz  and a low frequency filter of 1Hz . EEG data were recorded continuously and digitally stored.  ?  ?Description: EEG showed continuous generalized polymorphic disorganized 3-7Hz  theta-delta slowing admixed with low amplitude 13-15hz  generalized beta activity. Hyperventilation and photic stimulation were not performed.    ?  ?ABNORMALITY ?- Continuous slow, generalized ?  ?IMPRESSION: ?This study is suggestive of moderate to severe diffuse encephalopathy, nonspecific etiology but likely related to sedation. No seizures or epileptiform discharges were seen throughout the recording. ?  ?Lora Havens  ?

## 2021-09-05 NOTE — Progress Notes (Signed)
LTM EEG discontinued - no skin breakdown at unhook.   

## 2021-09-05 NOTE — Progress Notes (Signed)
Subjective: ?No seizures, she continues to be sedated with Versed 5 ? ?Exam: ?Vitals:  ? 09/05/21 0749 09/05/21 0800  ?BP:  (!) 125/47  ?Pulse:  (!) 120  ?Resp:  (!) 25  ?Temp: 100.2 ?F (37.9 ?C)   ?SpO2:  98%  ? ?Gen: In bed, NAD ?Resp: non-labored breathing, no acute distress ?Abd: soft, nt ? ?Neuro: ?MS: Does not open eyes or follow commands ?CN: Pupils are reactive bilaterally, face relatively symmetric ?Motor: She withdraws to noxious stimulation in all four extremities ?Sensory: As above ? ?Pertinent Labs: ?Creatinine 1.98 ?Sodium 144 ?Potassium 4.9 ?Calcium 8.4 ?B12 228 ?TSH 0.472 ? ?Impression: 71 year old female with a history of atrial fibrillation on Eliquis, hypertension, heart failure with preserved EF, bilateral PCA strokes.  She had left gaze reportedly with nystagmus and left weakness and there was concern for seizure.  Due to a GCS of three, she was intubated.  On initial evaluation by teleneurology, she had no movement on her left side.  ? ?Possibilities include focal status epilepticus (I feel most likely), stroke, encephalopathy(possibly due to hypoxia?) with recrudescence.  ? ?At this point, with no seizures on EEG, can disconitnue continuous monitoring and get MRI.  ? ?Recommendations: ?1) d/c EEG ?2) MRI brain  ?3) continue keppra 500mg  BID.  ?4) will follow.  ? ?Roland Rack, MD ?[facility] ?[phone] ? ?If 7pm- 7am, please page neurology on call as listed in [place]. ? ?

## 2021-09-05 NOTE — Progress Notes (Signed)
ANTICOAGULATION CONSULT NOTE  ? ?Pharmacy Consult for Heparin  ?Indication: hx atrial fibrillation and DVT  ? ?No Known Allergies ? ?Patient Measurements: ?Height: 5\' 1"  (154.9 cm) ?Weight: 91.2 kg (201 lb 1 oz) ?IBW/kg (Calculated) : 47.8 ?Heparin dosing weight: 69.2 kg ? ? ?Vital Signs: ?Temp: 99.5 ?F (37.5 ?C) (05/01 1531) ?Temp Source: Axillary (05/01 1531) ?BP: 118/61 (05/01 1730) ?Pulse Rate: 83 (05/01 1800) ? ?Labs: ?Recent Labs  ?  09/02/21 ?2351 09/03/21 ?6301 09/03/21 ?6010 09/03/21 ?2026 09/04/21 ?9323 09/04/21 ?5573 09/04/21 ?2156 09/05/21 ?0615 09/05/21 ?1643  ?HGB 11.9*  --  9.0* 9.2*  --  10.2*  --  12.5  --   ?HCT 38.9  --  29.2* 27.0*  --  30.4*  --  38.6  --   ?PLT 145*  --  85*  --   --  102*  --  79*  --   ?APTT 32  --   --   --   --   --  61* 42* 89*  ?LABPROT 14.7  --   --   --   --   --   --   --   --   ?INR 1.2  --   --   --   --   --   --   --   --   ?HEPARINUNFRC  --   --   --   --   --   --  0.77* 0.64  --   ?CREATININE 2.28* 2.01*  --   --  1.98*  --   --  1.98*  --   ? ? ? ?Estimated Creatinine Clearance: 27.2 mL/min (A) (by C-G formula based on SCr of 1.98 mg/dL (H)). ? ? ?Medical History: ?Past Medical History:  ?Diagnosis Date  ? Atrial fibrillation ([place])   ? CHF (congestive heart failure) ([place])   ? DVT (deep venous thrombosis) ([place])   ? Left lower extremity  ? HTN (hypertension)   ? Hypertension   ? MI (myocardial infarction) ([place])   ? ? ? ? ?Assessment: ?70yof with Hx DVT and afib on apixaban pta admitted with mental status change and intubated for airway protection. Head CT negative for bleed  ?Pharmacy asked to dose heparin while off oral anticoagulation.   ? ?Heparin level is 0.64 this AM but likely falsely elevated by DOAC use. ? ?PM update - aPTT slightly supratherapeutic at 89 after rate increase this AM. Hg wnl, plt back down to 79 (fluctuating 85-145 this admit). No bleeding or issues with infusion per discussion with RN. ? ?Goal of Therapy:  ?Heparin level 0.3-0.5 ?Aptt  66-85sec ?Monitor platelets by anticoagulation protocol: Yes ?  ?Plan:  ?Decrease heparin to 1000 units/hr ?Recheck aPTT in 8hr ?Monitor daily aPTT and heparin until correlating, CBC, s/sx bleeding ? ? ?Arturo Morton, PharmD, BCPS ?Please check AMION for all [place] contact numbers ?Clinical Pharmacist ?09/05/2021 6:24 PM ?

## 2021-09-05 NOTE — Progress Notes (Signed)
Attempted to reach son for update, however no answer.  ? ?Otilio Carpen Gerell Fortson, PA-C ? ?

## 2021-09-05 NOTE — Progress Notes (Signed)
ANTICOAGULATION CONSULT NOTE  ? ?Pharmacy Consult for Heparin  ?Indication: hx atrial fibrillation and DVT  ? ?No Known Allergies ? ?Patient Measurements: ?Height: 5\' 1"  (154.9 cm) ?Weight: 91.2 kg (201 lb 1 oz) ?IBW/kg (Calculated) : 47.8 ? ? ?Vital Signs: ?Temp: 99 ?F (37.2 ?C) (05/01 0556) ?Temp Source: Oral (05/01 0556) ?BP: 122/59 (05/01 0700) ?Pulse Rate: 72 (05/01 0700) ? ?Labs: ?Recent Labs  ?  09/02/21 ?2351 09/03/21 ?1093 09/03/21 ?2355 09/03/21 ?2026 09/04/21 ?7322 09/04/21 ?0254 09/04/21 ?2156 09/05/21 ?0615  ?HGB 11.9*  --  9.0* 9.2*  --  10.2*  --   --   ?HCT 38.9  --  29.2* 27.0*  --  30.4*  --   --   ?PLT 145*  --  85*  --   --  102*  --   --   ?APTT 32  --   --   --   --   --  61* 42*  ?LABPROT 14.7  --   --   --   --   --   --   --   ?INR 1.2  --   --   --   --   --   --   --   ?HEPARINUNFRC  --   --   --   --   --   --  0.77* 0.64  ?CREATININE 2.28* 2.01*  --   --  1.98*  --   --  1.98*  ? ? ? ?Estimated Creatinine Clearance: 27.2 mL/min (A) (by C-G formula based on SCr of 1.98 mg/dL (H)). ? ? ?Medical History: ?Past Medical History:  ?Diagnosis Date  ? Atrial fibrillation ([place])   ? CHF (congestive heart failure) ([place])   ? DVT (deep venous thrombosis) ([place])   ? Left lower extremity  ? HTN (hypertension)   ? Hypertension   ? MI (myocardial infarction) ([place])   ? ? ? ? ?Assessment: ?70yof with Hx DVT and afib on apixaban pta admitted with mental status change and intubated for airway protection.  Head CT negative for bleed  ?Pharmacy asked to dose heparin while off oral anticoagulation.   ? ?Heparin level is therapeutic but likely falsely elevated by DOAC use, aPTT is subtherapeutic at 42 seconds. No infusion issues overnight per nursing. ? ?Goal of Therapy:  ?Heparin level 0.3-0.5 ?Aptt 66-85sec ?Monitor platelets by anticoagulation protocol: Yes ?  ?Plan:  ?Increase heparin to 1050 units/h ?Recheck aPTT in 8h ?Daily aPTT, heparin level, CBC ? ?Arrie Senate, PharmD, BCPS, BCCP ?Clinical  Pharmacist ?[phone] ?Please check AMION for all [place] numbers ?09/05/2021 ? ? ? ? ?

## 2021-09-05 NOTE — Progress Notes (Addendum)
? ?NAME:  Susan Mclean, MRN:  [id number], DOB:  1950/09/05, LOS: 2 ?ADMISSION DATE:  09/03/2021, CONSULTATION DATE:  09/03/21 ?REFERRING MD:  EDP, CHIEF COMPLAINT:  intubated  ? ?History of Present Illness:  ?70 yo female with pmh of afib on eliquis, h/o dvt, HFpEF, htn, dm2, cad, ntesmi, ckd who presented to osh unresponsive and with L sided eye deviation, L weakness and nystagmus. All history obtained from chart as pt is intubated and unresponsive.  ? ?She was reportedly last known normal at 2300. Presented to [facility]. GCS 3 and was emergently intubated. She had spontaneous movement of R and no movement on L. Her L gaze deviation resolved. She was started on propofol and loaded with Keppra. CTH negative for acute cva but showed chronic b/l PCA strokes R >L. Neurology contacted for transfer for LTM and accepted pt. Upon arrival CCM was called after neurology saw pt with concern for status. We were asked to admit pt and they consult.  ? ?Pertinent  Medical History  ? has a past medical history of Atrial fibrillation ([place]), CHF (congestive heart failure) ([place]), DVT (deep venous thrombosis) ([place]), HTN (hypertension), Hypertension, and MI (myocardial infarction) ([place]). ? ? ?Significant Hospital Events: ?Including procedures, antibiotic start and stop dates in addition to other pertinent events   ?Accepted in transfer by neurology ?Arrived in ed intubated at that time neurology had EDP contact ccm for admission and they became consultant.  ?5/1 No further seizures on EEG, weaning sedation and plan for MRI ? ?Interim History / Subjective:  ? ?New Atrial fibrillation overnight and started on amiodarone  ? off propofol and weaning Versed ?No further seizures on EEG  ? ? ?Objective   ?Blood pressure (!) 122/59, pulse 77, temperature 100.2 ?F (37.9 ?C), temperature source Axillary, resp. rate (!) 22, height 5\' 1"  (1.549 m), weight 91.2 kg, SpO2 97 %. ?   ?Vent Mode: CPAP;PSV ?FiO2 (%):  [30 %] 30 % ?Set Rate:  [16  bmp] 16 bmp ?Vt Set:  [400 mL] 400 mL ?PEEP:  [5 cmH20] 5 cmH20 ?Pressure Support:  [12 cmH20] 12 cmH20 ?Plateau Pressure:  [15 cmH20-17 cmH20] 17 cmH20  ? ?Intake/Output Summary (Last 24 hours) at 09/05/2021 0801 ?Last data filed at 09/05/2021 0600 ?Gross per 24 hour  ?Intake 1278.12 ml  ?Output 1060 ml  ?Net 218.12 ml  ? ? ?Filed Weights  ? 09/03/21 0937  ?Weight: 91.2 kg  ? ? ?General:  elderly, critically ill-appearing F intubated and sedated  ?HEENT: MM pink/moist, pupils 57mm bilaterally and sluggishly responsive to light, sclera anicteric ?Neuro: examined on versed, not responsive to voice, withdraws to pain ?CV: s1s2 irregular, no m/r/g ?PULM:  mechanical breath sounds bilaterally without rhonchi or wheezing  ?GI: soft, bsx4 active  ?Extremities: warm/dry, no edema  ?Skin: no rashes or lesions ? ? ? ?Labs reviewed ?Creatinine 1.98 (stable since admission) ?Platelets 79 ? ?Resolved Hospital Problem list   ? ? ?Assessment & Plan:  ? ? ?Acute encephalopathy  ?Seizure like activity ?Repeat CT head stable with NAICA ?EEG with no further seizures ?-LTM discontinued today, plan to get MRI and then attempt SBT/SAT ?-Appreciate Neuro input ?-AEDs per Neuro: Jodi Marble, wean Propofol and Versed ? ? ? ?Acute hypoxic resp failure in the setting of seizure activity ?-tolerating PSV today ?-plan to wean sedation and attempt SAT once MRI completed ?--Maintain full vent support with SAT/SBT as tolerated ?-titrate Vent setting to maintain SpO2 greater than or equal to 90%. ?-HOB elevated 30 degrees. ?-[place]  pressures less than 30 cm H20.  ?-Follow chest x-ray, ABG prn.   ?-Bronchial hygiene and RT/bronchodilator protocol. ? ? ?Atrial Fibrillation  ?Appears to be new onset overnight ?-continue amiodarone and heparin ?-check TSH ?-echo ? ? ? ?Thrombocytopenia ?Platelets 79, 123 one month ago ?-monitor, may need to stop heparin ?-no sign of bleeding currently ? ?HFpEF, HTN, HL ?-no acute exacerbation ?-continue statin ? ?T2dm:   ?-continue SSI ?-a1c in 11/22 =6.2 ? ?H/o CVA:  ?-Plavix, MRI today ? ?CKD III-IV ?Creatinine near baseline  ?-continue to monitor renal indices and UOP ? ?Metabolic acidosis: ? improving ?S/p 2 amps bicarb. Normal LA ? ? ? ?Best Practice (right click and "Reselect all SmartList Selections" daily)  ? ?Diet/type: NPO w/ meds via tube ?DVT prophylaxis: SCD ?GI prophylaxis: PPI ?Lines: N/A ?Foley:  Yes, and it is still needed ?Code Status:  full code ?Last date of multidisciplinary goals of care discussion [pending, will update family after MRI] ? ?Labs   ?CBC: ?Recent Labs  ?Lab 09/02/21 ?2351 09/03/21 ?0650 09/03/21 ?2026 09/04/21 ?9767 09/05/21 ?0615  ?WBC 10.2 5.5  --  8.4 10.3  ?NEUTROABS 3.4  --   --   --   --   ?HGB 11.9* 9.0* 9.2* 10.2* 12.5  ?HCT 38.9 29.2* 27.0* 30.4* 38.6  ?MCV 90.7 93.6  --  86.6 86.9  ?PLT 145* 85*  --  102* 79*  ? ? ? ?Basic Metabolic Panel: ?Recent Labs  ?Lab 09/02/21 ?2351 09/03/21 ?3419 09/03/21 ?2026 09/04/21 ?3790 09/05/21 ?0615  ?NA 141 140 146* 144 144  ?K 5.0 5.2* 4.8 4.6 4.9  ?CL 114* 120*  --  121* 118*  ?CO2 15* 14*  --  19* 17*  ?GLUCOSE 207* 115*  --  155* 154*  ?BUN 31* 27*  --  24* 28*  ?CREATININE 2.28* 2.01*  --  1.98* 1.98*  ?CALCIUM 9.2 8.7*  --  8.8* 8.4*  ?MG  --  1.8  --  1.8  --   ?PHOS  --  3.4  --   --   --   ? ? ?GFR: ?Estimated Creatinine Clearance: 27.2 mL/min (A) (by C-G formula based on SCr of 1.98 mg/dL (H)). ?Recent Labs  ?Lab 09/02/21 ?2351 09/03/21 ?0650 09/03/21 ?0934 09/04/21 ?2409 09/05/21 ?0615  ?WBC 10.2 5.5  --  8.4 10.3  ?LATICACIDVEN  --  1.0 0.9  --   --   ? ? ? ?Liver Function Tests: ?Recent Labs  ?Lab 09/02/21 ?2351  ?AST 29  ?ALT 12  ?ALKPHOS 187*  ?BILITOT 0.6  ?PROT 8.2*  ?ALBUMIN 3.7  ? ? ?No results for input(s): LIPASE, AMYLASE in the last 168 hours. ?No results for input(s): AMMONIA in the last 168 hours. ? ?ABG ?   ?Component Value Date/Time  ? PHART 7.380 09/03/2021 2026  ? PCO2ART 32.1 09/03/2021 2026  ? PO2ART 133 (H) 09/03/2021  2026  ? HCO3 19.0 (L) 09/03/2021 2026  ? TCO2 20 (L) 09/03/2021 2026  ? ACIDBASEDEF 5.0 (H) 09/03/2021 2026  ? O2SAT 99 09/03/2021 2026  ? ?  ? ?Coagulation Profile: ?Recent Labs  ?Lab 09/02/21 ?2351  ?INR 1.2  ? ? ? ?Cardiac Enzymes: ?No results for input(s): CKTOTAL, CKMB, CKMBINDEX, TROPONINI in the last 168 hours. ? ?HbA1C: ?Hgb A1c MFr Bld  ?Date/Time Value Ref Range Status  ?09/03/2021 06:50 AM 6.0 (H) 4.8 - 5.6 % Final  ?  Comment:  ?  REPEATED TO VERIFY ?(NOTE) ?Pre diabetes:  5.7%-6.4% ? ?Diabetes:              >6.4% ? ?Glycemic control for   <7.0% ?adults with diabetes ?  ?03/09/2021 06:26 AM 6.2 (H) 4.8 - 5.6 % Final  ?  Comment:  ?  (NOTE) ?Pre diabetes:          5.7%-6.4% ? ?Diabetes:              >6.4% ? ?Glycemic control for   <7.0% ?adults with diabetes ?  ? ? ?CBG: ?Recent Labs  ?Lab 09/04/21 ?1152 09/04/21 ?1552 09/04/21 ?2002 09/04/21 ?2347 09/05/21 ?0505  ?GLUCAP 124* 149* 134* 149* 148*  ? ? ? ?Critical care time: 42 min  ? ?CRITICAL CARE ?Performed by: Otilio Carpen Stclair Szymborski ? ? ?Total critical care time: 42 minutes ? ?Critical care time was exclusive of separately billable procedures and treating other patients. ? ?Critical care was necessary to treat or prevent imminent or life-threatening deterioration. ? ?Critical care was time spent personally by me on the following activities: development of treatment plan with patient and/or surrogate as well as nursing, discussions with consultants, evaluation of patient's response to treatment, examination of patient, obtaining history from patient or surrogate, ordering and performing treatments and interventions, ordering and review of laboratory studies, ordering and review of radiographic studies, pulse oximetry and re-evaluation of patient's condition. ? ?Otilio Carpen Zacharee Gaddie, PA-C ?[facility] ?See Amion for pager ?If no response to pager , please call [phone] until 7pm ?After 7:00 pm call Elink  [phone] ? ?  ?

## 2021-09-05 NOTE — Progress Notes (Signed)
eLink Physician-Brief Progress Note ?Patient Name: Susan Mclean ?DOB: [birth year] ?MRN: [id number] ? ? ?Date of Service ? 09/05/2021  ?HPI/Events of Note ? Patient in atrial fibrillation with RVR.  ?eICU Interventions ? Amiodarone bolus + gtt ordered.  ? ? ? ?  ? ?Frederik Pear ?09/05/2021, 3:18 AM ?

## 2021-09-06 ENCOUNTER — Ambulatory Visit: Payer: 59 | Admitting: Medical

## 2021-09-06 ENCOUNTER — Inpatient Hospital Stay (HOSPITAL_COMMUNITY): Payer: 59

## 2021-09-06 DIAGNOSIS — J9601 Acute respiratory failure with hypoxia: Secondary | ICD-10-CM

## 2021-09-06 LAB — CBC
HCT: 33.1 % — ABNORMAL LOW (ref 36.0–46.0)
Hemoglobin: 10.5 g/dL — ABNORMAL LOW (ref 12.0–15.0)
MCH: 27.9 pg (ref 26.0–34.0)
MCHC: 31.7 g/dL (ref 30.0–36.0)
MCV: 88 fL (ref 80.0–100.0)
Platelets: 98 10*3/uL — ABNORMAL LOW (ref 150–400)
RBC: 3.76 MIL/uL — ABNORMAL LOW (ref 3.87–5.11)
RDW: 15.1 % (ref 11.5–15.5)
WBC: 8.4 10*3/uL (ref 4.0–10.5)
nRBC: 0 % (ref 0.0–0.2)

## 2021-09-06 LAB — BASIC METABOLIC PANEL
Anion gap: 7 (ref 5–15)
BUN: 35 mg/dL — ABNORMAL HIGH (ref 8–23)
CO2: 18 mmol/L — ABNORMAL LOW (ref 22–32)
Calcium: 8.6 mg/dL — ABNORMAL LOW (ref 8.9–10.3)
Chloride: 118 mmol/L — ABNORMAL HIGH (ref 98–111)
Creatinine, Ser: 2.12 mg/dL — ABNORMAL HIGH (ref 0.44–1.00)
GFR, Estimated: 25 mL/min — ABNORMAL LOW (ref >60)
Glucose, Bld: 138 mg/dL — ABNORMAL HIGH (ref 70–99)
Potassium: 4.8 mmol/L (ref 3.5–5.1)
Sodium: 143 mmol/L (ref 135–145)

## 2021-09-06 LAB — HEPARIN LEVEL (UNFRACTIONATED): Heparin Unfractionated: 0.54 IU/mL (ref 0.30–0.70)

## 2021-09-06 LAB — GLUCOSE, CAPILLARY
Glucose-Capillary: 127 mg/dL — ABNORMAL HIGH (ref 70–99)
Glucose-Capillary: 134 mg/dL — ABNORMAL HIGH (ref 70–99)
Glucose-Capillary: 138 mg/dL — ABNORMAL HIGH (ref 70–99)
Glucose-Capillary: 141 mg/dL — ABNORMAL HIGH (ref 70–99)
Glucose-Capillary: 70 mg/dL (ref 70–99)
Glucose-Capillary: 88 mg/dL (ref 70–99)

## 2021-09-06 LAB — APTT
aPTT: 65 seconds — ABNORMAL HIGH (ref 24–36)
aPTT: 66 seconds — ABNORMAL HIGH (ref 24–36)

## 2021-09-06 IMAGING — MR MR HEAD W/O CM
9 of 10 series · 36 of 48 positions shown · non-contrast
Comparison: Head CT from [DATE]. brain MRI [DATE]

CLINICAL DATA: Neuro deficit with stroke suspected

EXAM:
MRI HEAD WITHOUT CONTRAST
TECHNIQUE: Multiplanar, multiecho pulse sequences of the brain and surrounding
structures were obtained without intravenous contrast.

[Series 4: DWI · axial · 3.0mm · 1.09mm/px · z∈[-71,+67]mm · 9 of 94 slices shown (1 of 4)]
[im 1/94]
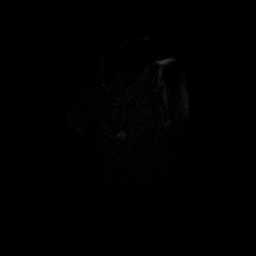
[im 12/94]
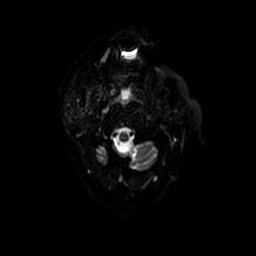
[im 24/94]
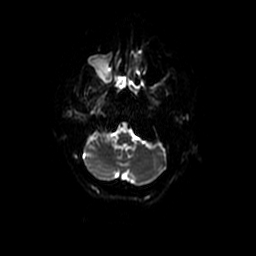
[im 35/94]
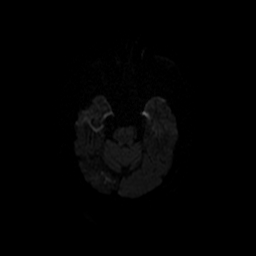
[im 47/94]
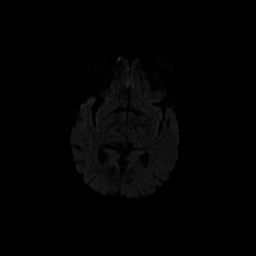
[im 59/94]
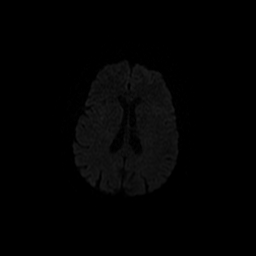
[im 70/94]
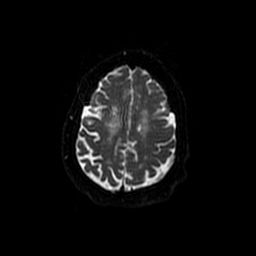
[im 82/94]
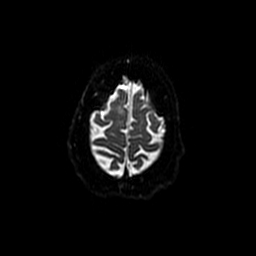
[im 94/94]
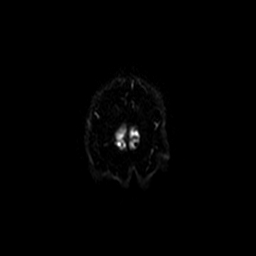

[Series 5: DWI · coronal · 5.0mm · 1.09mm/px · 7 of 64 slices shown (2 of 4)]
[im 1/64]
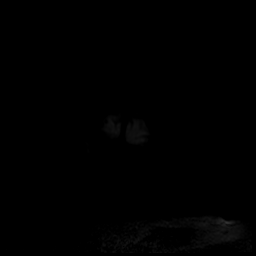
[im 11/64]
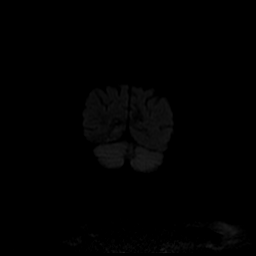
[im 22/64]
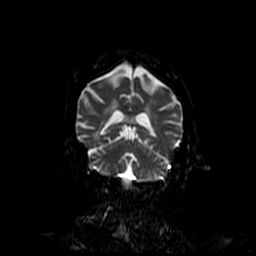
[im 32/64]
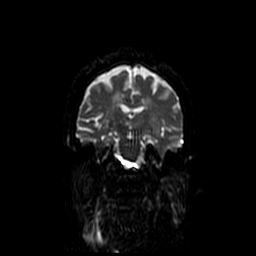
[im 43/64]
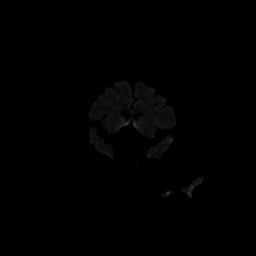
[im 53/64]
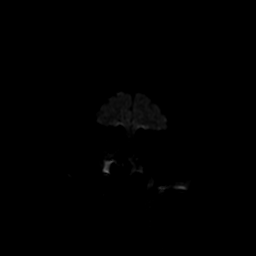
[im 64/64]
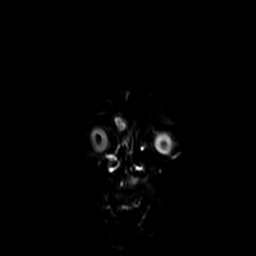

[Series 6: T1 · sagittal · 5.0mm · 0.47mm/px · 2 of 23 slices shown (1 of 2)]
[im 1/23]
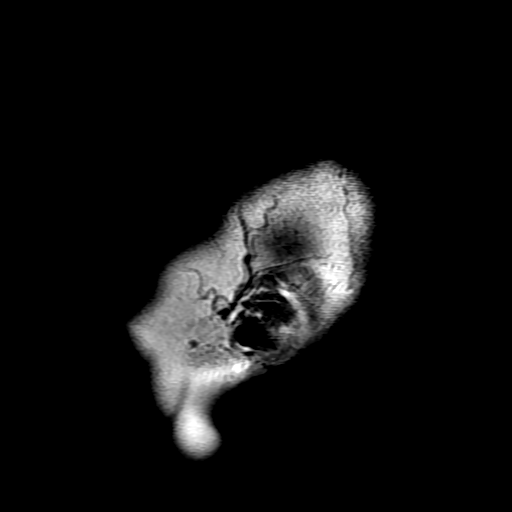
[im 23/23]
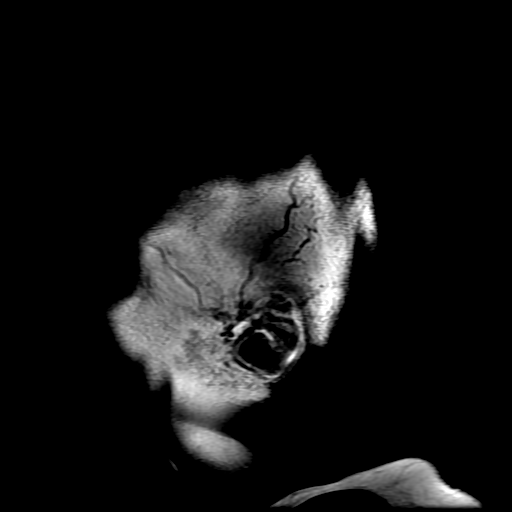

[Series 8: T2 · axial · 5.0mm · 0.43mm/px · z∈[-85,+65]mm · 3 of 26 slices shown (1 of 2)]
[im 1/26]
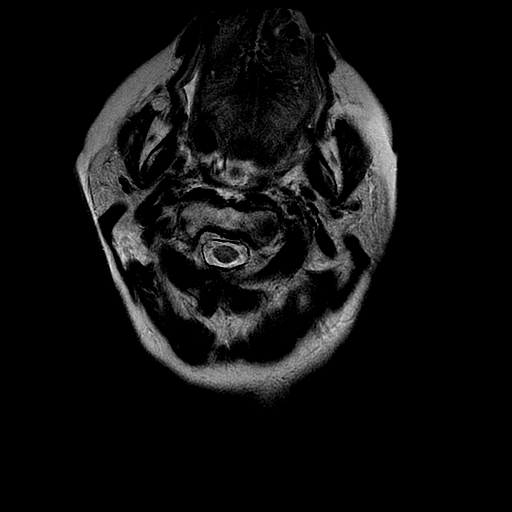
[im 13/26]
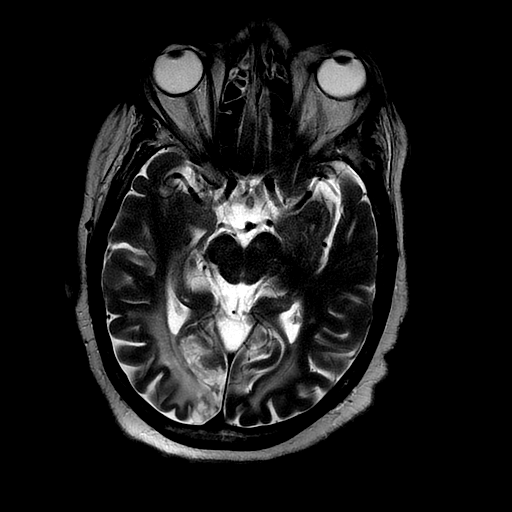
[im 26/26]
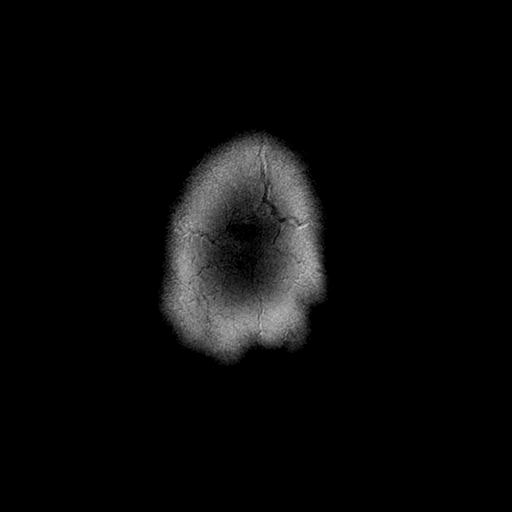

[Series 9: FLAIR · axial · 5.0mm · 0.43mm/px · z∈[-85,+65]mm · 3 of 26 slices shown]
[im 1/26]
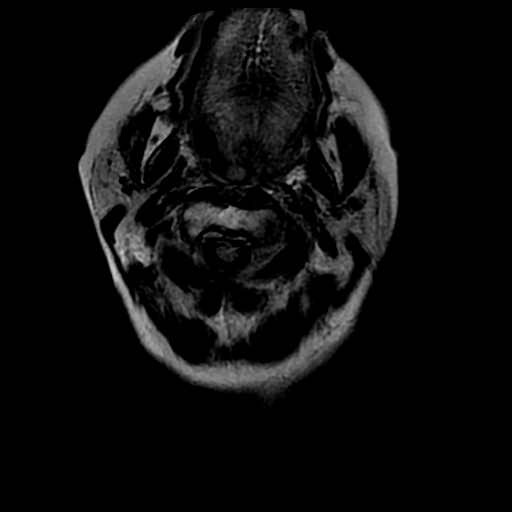
[im 13/26]
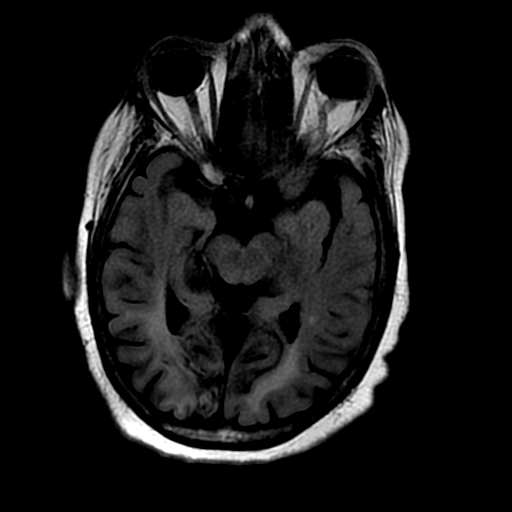
[im 26/26]
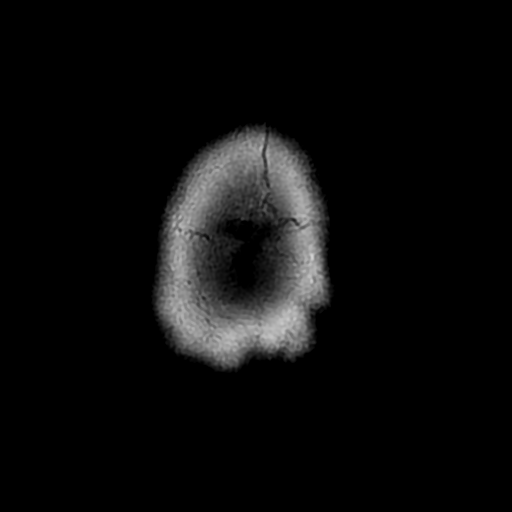

[Series 10: T1 · axial · 3.0mm · 0.47mm/px · 1 of 100 slices shown (2 of 2)]
[im 1/100]
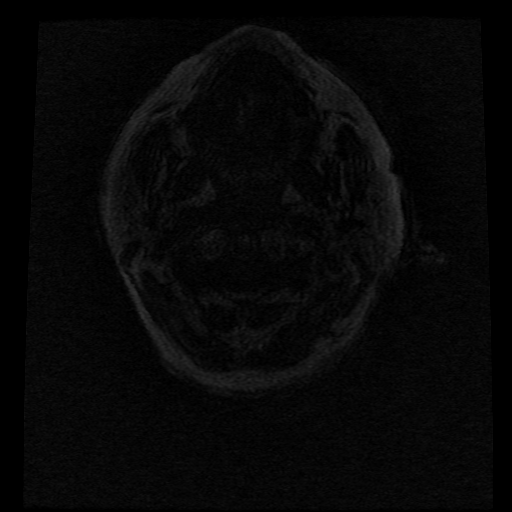

[Series 11: T2 · coronal · 5.0mm · 0.43mm/px · 3 of 27 slices shown (2 of 2)]
[im 1/27]
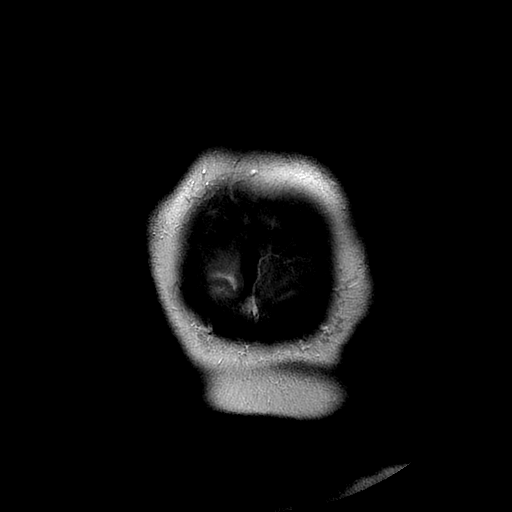
[im 14/27]
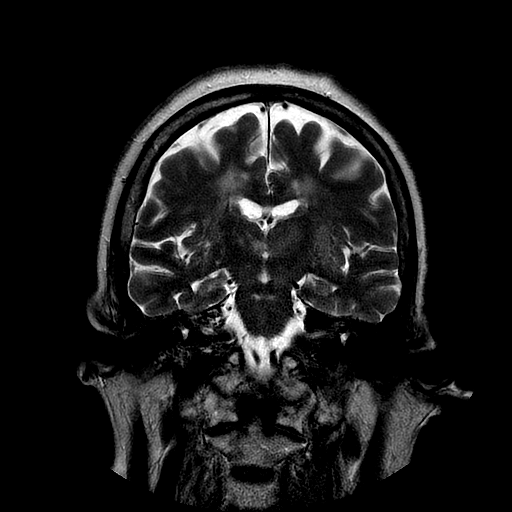
[im 27/27]
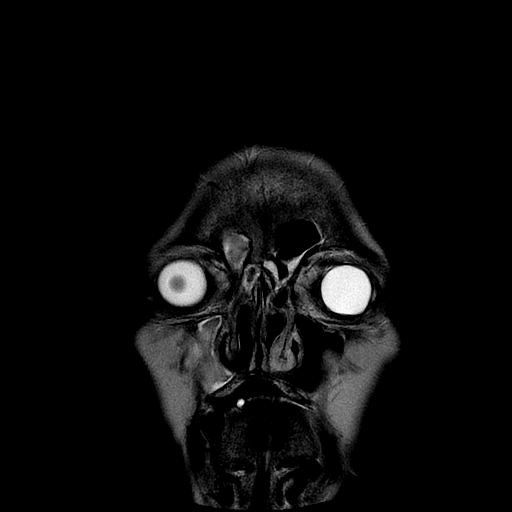

[Series 400: DWI · axial · 3.0mm · 1.09mm/px · z∈[-71,+67]mm · 5 of 47 slices shown (3 of 4)]
[im 1/47]
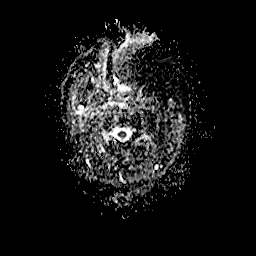
[im 12/47]
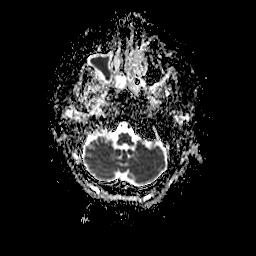
[im 24/47]
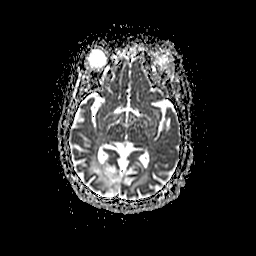
[im 35/47]
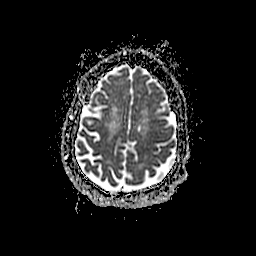
[im 47/47]
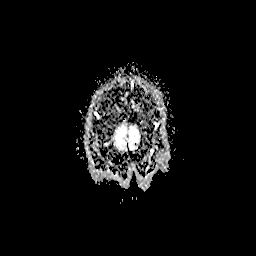

[Series 500: DWI · coronal · 5.0mm · 1.09mm/px · 3 of 32 slices shown (4 of 4)]
[im 1/32]
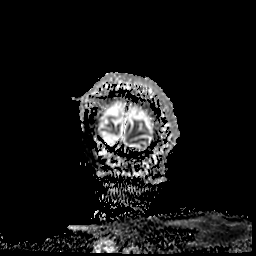
[im 16/32]
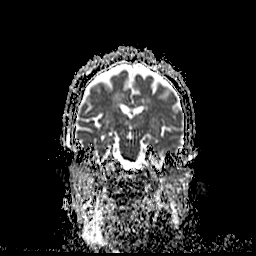
[im 32/32]
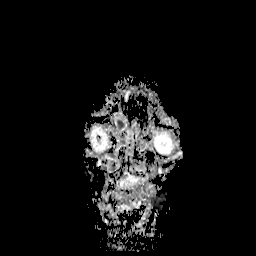

[36 of 48 positions shown; findings below may reference images not displayed]

FINDINGS: Brain: No acute infarction, hemorrhage, hydrocephalus, extra-axial
collection or mass lesion. Mild diffusion hyperintensity in the
inferior and paramedian right occipital lobe is attributed to
mineralization from pre-existing infarct.

Remote bilateral occipital infarcts. Chronic small vessel ischemic
gliosis with chronic left centrum semiovale lacunar infarct. Chronic
small vessel ischemia in the pons.

Vascular: Major flow voids are preserved

Skull and upper cervical spine: No focal marrow lesion.

Sinuses/Orbits: Chronic patchy sinus opacification, confluent in the
right maxilla. Intubation with nasopharyngeal fluid.
IMPRESSION: 1. No emergent finding including acute infarct.
2. Chronic small vessel disease and chronic bilateral occipital
infarcts.

## 2021-09-06 NOTE — Progress Notes (Signed)
Pt transported to MRI on full support and 100% FIO2.  ?

## 2021-09-06 NOTE — Progress Notes (Addendum)
Subjective: ?Sedation is lightened.  ? ?Exam: ?Vitals:  ? 09/06/21 0500 09/06/21 0600  ?BP: (!) 141/73 140/74  ?Pulse: 80 80  ?Resp: (!) 21 20  ?Temp:    ?SpO2: 94% 98%  ? ?Gen: In bed, intubated ? ? ?Neuro: ?MS: opens eyes and follows commands ?CN: Pupils are reactive bilaterally, eyes cross midline in both directions, endorses seeing fingers wiggling in both hemifields.  ?Motor: She lifts both arm s against gravity, poor effort in BLE ?Sensory: As above ? ?Pertinent Labs: ?Creatinine 2.12, gfr 25 ?B12 228 ? ?Impression: 71 year old female with a history of atrial fibrillation on Eliquis, hypertension, heart failure with preserved EF, bilateral PCA strokes.  She had left gaze reportedly with nystagmus and left weakness and there was concern for seizure.  Due to a GCS of three, she was intubated.  On initial evaluation by teleneurology, she had no movement on her left side.  ? ?Possibilities include focal status epilepticus (I feel most likely), stroke, encephalopathy(possibly due to hypoxia?) with recrudescence.  ? ?B12 is borderline, MMA is pending and I gave a single dose of parenteral B12.  ? ?Recommendations: ?1) MRI brain  ?2) continue keppra 500mg  BID(renally dosed) ?3) will follow.  ? ?Roland Rack, MD ?[facility] ?[phone] ? ?If 7pm- 7am, please page neurology on call as listed in [place]. ? ?

## 2021-09-06 NOTE — Progress Notes (Addendum)
eLink Physician-Brief Progress Note ?Patient Name: Susan Mclean ?DOB: [birth year] ?MRN: [id number] ? ? ?Date of Service ? 09/06/2021  ?HPI/Events of Note ? Patient recently extubated and having difficulty with coughing up secretions. Unfortunately patient is Heparinized making NT suction risky.  ?eICU Interventions ? Bed induced chest physiotherapy, incentive spirometer, and Flutter valve ordered.  ? ? ? ?  ? ?Kerry Kass Jolynne Spurgin ?09/06/2021, 8:52 PM ?

## 2021-09-06 NOTE — Progress Notes (Deleted)
Cardiology Office Note:    Date:  09/06/2021   ID:  Susan Mclean, DOB 1950/11/22, MRN [id number]  PCP:  Pcp, No  [facility] Cardiologist:  Nelva Bush, MD  [facility] Electrophysiologist:  None   Referring MD: No ref. provider found   Chief Complaint: 1 month follow-up  History of Present Illness:    Susan Mclean is a 71 y.o. female with a hx of ***  Past Medical History:  Diagnosis Date   Atrial fibrillation ([place])    CHF (congestive heart failure) ([place])    DVT (deep venous thrombosis) ([facility])    Left lower extremity   HTN (hypertension)    Hypertension    MI (myocardial infarction) ([place])     Past Surgical History:  Procedure Laterality Date   CORONARY STENT INTERVENTION N/A 03/09/2021   Procedure: CORONARY STENT INTERVENTION;  Surgeon: Nelva Bush, MD;  Location: [facility];  Service: Cardiovascular;  Laterality: N/A;   CORONARY STENT PLACEMENT     LEFT HEART CATH AND CORONARY ANGIOGRAPHY N/A 03/09/2021   Procedure: LEFT HEART CATH AND CORONARY ANGIOGRAPHY;  Surgeon: Nelva Bush, MD;  Location: [facility];  Service: Cardiovascular;  Laterality: N/A;    Current Medications: No outpatient medications have been marked as taking for the 09/06/21 encounter (Appointment) with Kathlen Mody, Bristal Steffy H, PA-C.     Allergies:   Patient has no known allergies.   Social History   Socioeconomic History   Marital status: Unknown    Spouse name: Not on file   Number of children: Not on file   Years of education: Not on file   Highest education level: Not on file  Occupational History   Not on file  Tobacco Use   Smoking status: Never   Smokeless tobacco: Never  Substance and Sexual Activity   Alcohol use: Not Currently   Drug use: Never   Sexual activity: Not on file  Other Topics Concern   Not on file  Social History Narrative   Not on file   Social Determinants of Health   Financial Resource Strain: Not on file  Food Insecurity: Not  on file  Transportation Needs: Not on file  Physical Activity: Not on file  Stress: Not on file  Social Connections: Not on file     Family History: The patient's ***family history is not on file.  ROS:   Please see the history of present illness.    *** All other systems reviewed and are negative.  EKGs/Labs/Other Studies Reviewed:    The following studies were reviewed today: ***  EKG:  EKG is *** ordered today.  The ekg ordered today demonstrates ***  Recent Labs: 03/08/2021: B Natriuretic Peptide 215.2 09/02/2021: ALT 12 09/04/2021: Magnesium 1.8; TSH 0.472 09/06/2021: BUN 35; Creatinine, Ser 2.12; Hemoglobin 10.5; Platelets 98; Potassium 4.8; Sodium 143  Recent Lipid Panel    Component Value Date/Time   CHOL 105 03/10/2021 0458   TRIG 34 09/04/2021 0538   HDL 36 (L) 03/10/2021 0458   CHOLHDL 2.9 03/10/2021 0458   VLDL 9 03/10/2021 0458   LDLCALC 60 03/10/2021 0458     Risk Assessment/Calculations:   {Does this patient have ATRIAL FIBRILLATION?:[phone]}   Physical Exam:    VS:  There were no vitals taken for this visit.    Wt Readings from Last 3 Encounters:  09/06/21 195 lb 12.3 oz (88.8 kg)  09/02/21 201 lb 1.3 oz (91.2 kg)  08/05/21 193 lb (87.5 kg)  GEN: *** Well nourished, well developed in no acute distress HEENT: Normal NECK: No JVD; No carotid bruits LYMPHATICS: No lymphadenopathy CARDIAC: ***RRR, no murmurs, rubs, gallops RESPIRATORY:  Clear to auscultation without rales, wheezing or rhonchi  ABDOMEN: Soft, non-tender, non-distended MUSCULOSKELETAL:  No edema; No deformity  SKIN: Warm and dry NEUROLOGIC:  Alert and oriented x 3 PSYCHIATRIC:  Normal affect   ASSESSMENT:    No diagnosis found. PLAN:    In order of problems listed above:  ***  Disposition: Follow up {follow up:15908} with ***   Shared Decision Making/Informed Consent   {Are you ordering a CV Procedure (e.g. stress test, cath, DCCV, TEE, etc)?   Press F2         :[id number]}    Signed, Kramer Hanrahan Ninfa Meeker, PA-C  09/06/2021 8:45 AM    [facility]

## 2021-09-06 NOTE — Procedures (Signed)
Extubation Procedure Note ? ?Patient Details:   ?Name: Susan Mclean ?DOB: [birth year] ?MRN: [id number] ?  ?Airway Documentation:  ?  ?Vent end date: 09/06/21 Vent end time: 1530  ? ?Evaluation ? O2 sats: stable throughout ?Complications: No apparent complications ?Patient did tolerate procedure well. ?Bilateral Breath Sounds: Clear, Diminished ?  ?Yes placed on 4 L [place] good cough ? ?Vaughan Basta [place] ?09/06/2021, 3:32 PM ? ?

## 2021-09-06 NOTE — Progress Notes (Signed)
ANTICOAGULATION CONSULT NOTE  ? ?Pharmacy Consult for Heparin  ?Indication: hx atrial fibrillation and DVT  ? ?No Known Allergies ? ?Patient Measurements: ?Height: 5\' 1"  (154.9 cm) ?Weight: 88.8 kg (195 lb 12.3 oz) ?IBW/kg (Calculated) : 47.8 ?Heparin dosing weight: 69.2 kg ? ? ?Vital Signs: ?Temp: 100 ?F (37.8 ?C) (05/02 0725) ?Temp Source: Axillary (05/02 0725) ?BP: 120/64 (05/02 1456) ?Pulse Rate: 79 (05/02 1456) ? ?Labs: ?Recent Labs  ?  09/04/21 ?4782 09/04/21 ?9562 09/04/21 ?1308 09/04/21 ?2156 09/05/21 ?0615 09/05/21 ?1643 09/06/21 ?0319 09/06/21 ?1355  ?HGB  --  10.2*  --   --  12.5  --  10.5*  --   ?HCT  --  30.4*  --   --  38.6  --  33.1*  --   ?PLT  --  102*  --   --  79*  --  98*  --   ?APTT  --   --    < > 61* 42* 89* 65* 66*  ?HEPARINUNFRC  --   --   --  0.77* 0.64  --  0.54  --   ?CREATININE 1.98*  --   --   --  1.98*  --  2.12*  --   ? < > = values in this interval not displayed.  ? ? ? ?Estimated Creatinine Clearance: 25 mL/min (A) (by C-G formula based on SCr of 2.12 mg/dL (H)). ? ? ?Medical History: ?Past Medical History:  ?Diagnosis Date  ? Atrial fibrillation ([place])   ? CHF (congestive heart failure) ([place])   ? DVT (deep venous thrombosis) ([place])   ? Left lower extremity  ? HTN (hypertension)   ? Hypertension   ? MI (myocardial infarction) ([place])   ? ? ? ? ?Assessment: ?70yof with Hx DVT and afib on apixaban pta admitted with mental status change and intubated for airway protection. Head CT negative for bleed  ?Pharmacy asked to dose heparin while off oral anticoagulation.   ? ?Repeat aPTT is therapeutic at 66 seconds, will allow is to trend up overnight. ? ?Goal of Therapy:  ?Heparin level 0.3-0.5 ?Aptt 66-85sec ?Monitor platelets by anticoagulation protocol: Yes ?  ?Plan:  ?Heparin 1050 units/h ?Daily aPTT, heparin level, CBC ? ?Arrie Senate, PharmD, BCPS, BCCP ?Clinical Pharmacist ?[phone] ?Please check AMION for all [place] numbers ?09/06/2021 ? ? ?

## 2021-09-06 NOTE — Progress Notes (Signed)
ANTICOAGULATION CONSULT NOTE  ? ?Pharmacy Consult for Heparin  ?Indication: hx atrial fibrillation and DVT  ? ?No Known Allergies ? ?Patient Measurements: ?Height: 5\' 1"  (154.9 cm) ?Weight: 88.8 kg (195 lb 12.3 oz) ?IBW/kg (Calculated) : 47.8 ?Heparin dosing weight: 69.2 kg ? ? ?Vital Signs: ?Temp: 98.2 ?F (36.8 ?C) (05/02 0300) ?Temp Source: Oral (05/02 0300) ?BP: 148/72 (05/02 0400) ?Pulse Rate: 76 (05/02 0400) ? ?Labs: ?Recent Labs  ?  09/04/21 ?3546 09/04/21 ?5681 09/04/21 ?2751 09/04/21 ?2156 09/05/21 ?0615 09/05/21 ?1643 09/06/21 ?0319  ?HGB  --  10.2*  --   --  12.5  --  10.5*  ?HCT  --  30.4*  --   --  38.6  --  33.1*  ?PLT  --  102*  --   --  79*  --  98*  ?APTT  --   --    < > 61* 42* 89* 65*  ?HEPARINUNFRC  --   --   --  0.77* 0.64  --  0.54  ?CREATININE 1.98*  --   --   --  1.98*  --  2.12*  ? < > = values in this interval not displayed.  ? ? ? ?Estimated Creatinine Clearance: 25 mL/min (A) (by C-G formula based on SCr of 2.12 mg/dL (H)). ? ? ?Medical History: ?Past Medical History:  ?Diagnosis Date  ? Atrial fibrillation ([place])   ? CHF (congestive heart failure) ([place])   ? DVT (deep venous thrombosis) ([place])   ? Left lower extremity  ? HTN (hypertension)   ? Hypertension   ? MI (myocardial infarction) ([place])   ? ? ? ? ?Assessment: ?70yof with Hx DVT and afib on apixaban pta admitted with mental status change and intubated for airway protection. Head CT negative for bleed  ?Pharmacy asked to dose heparin while off oral anticoagulation.   ? ?Heparin level 0.54 this AM but likely falsely elevated by DOAC use. ? ?aPTT slightly subtherapeutic at 65 after rate decrease yesterday. CBC stable with Hgb and PLT low (fluctuating 85-145 this admit). No bleeding or issues with infusion per discussion with RN. ? ?Goal of Therapy:  ?Heparin level 0.3-0.5 ?Aptt 66-85sec ?Monitor platelets by anticoagulation protocol: Yes ?  ?Plan:  ?Increase heparin to 1050 units/hr ?Recheck aPTT in 8hr ?Monitor daily aPTT and heparin  until correlating, CBC, s/sx bleeding ? ? ?Georga Bora, PharmD ?Clinical Pharmacist ?09/06/2021 5:10 AM ?Please check AMION for all [place] numbers ? ?

## 2021-09-06 NOTE — Progress Notes (Signed)
? ?NAME:  Susan Mclean, MRN:  [id number], DOB:  1951/01/08, LOS: 3 ?ADMISSION DATE:  09/03/2021, CONSULTATION DATE:  09/03/21 ?REFERRING MD:  EDP, CHIEF COMPLAINT:  intubated  ? ?History of Present Illness:  ?71 yo female with pmh of afib on eliquis, h/o dvt, HFpEF, htn, dm2, cad, ntesmi, ckd who presented to [facility] unresponsive and with L sided eye deviation, L weakness and nystagmus. All history obtained from chart as pt is intubated and unresponsive.  ? ?She was reportedly last known normal at 2300. Presented to [facility]. GCS 3 and was emergently intubated. She had spontaneous movement of R and no movement on L. Her L gaze deviation resolved. She was started on propofol and loaded with Keppra. CTH negative for acute cva but showed chronic b/l PCA strokes R >L. Neurology contacted for transfer for LTM and accepted pt. Upon arrival CCM was called after neurology saw pt with concern for status. We were asked to admit pt and they consult.  ? ?Pertinent  Medical History  ? has a past medical history of Atrial fibrillation ([place]), CHF (congestive heart failure) ([place]), DVT (deep venous thrombosis) ([place]), HTN (hypertension), Hypertension, and MI (myocardial infarction) ([place]). ? ? ?Significant Hospital Events: ?Including procedures, antibiotic start and stop dates in addition to other pertinent events   ?Accepted in transfer by neurology ?Arrived in ed intubated at that time neurology had EDP contact ccm for admission and they became consultant.  ?5/1 No further seizures on EEG, weaning sedation and plan for MRI ?5/2 waking up and following commands  ? ?Interim History / Subjective:  ? ?No seizure activity or overnight events  ?Weaning on PSV this morning ?SR this morning ? ?Objective   ?Blood pressure 125/71, pulse 85, temperature 100 ?F (37.8 ?C), temperature source Axillary, resp. rate 16, height 5\' 1"  (1.549 m), weight 88.8 kg, SpO2 97 %. ?   ?Vent Mode: PSV;CPAP ?FiO2 (%):  [30 %] 30 % ?Set Rate:  [16 bmp] 16  bmp ?Vt Set:  [400 mL] 400 mL ?PEEP:  [5 cmH20] 5 cmH20 ?Pressure Support:  [8 FIE33-29 cmH20] 8 cmH20 ?Plateau Pressure:  [16 cmH20-17 cmH20] 17 cmH20  ? ?Intake/Output Summary (Last 24 hours) at 09/06/2021 1003 ?Last data filed at 09/06/2021 0720 ?Gross per 24 hour  ?Intake 2039.58 ml  ?Output 1166 ml  ?Net 873.58 ml  ? ? ?Filed Weights  ? 09/03/21 5188 09/06/21 0442  ?Weight: 91.2 kg 88.8 kg  ? ? ?General:  elderly F, intubated, awake and in no distress ?HEENT: MM pink/moist, ETT in place, sclera anicteric  ?Neuro: opening eyes and following commands, nodding to questions ?CV: s1s2 rrr, no m/r/g ?PULM:  mechanical breath sounds bilaterally, PSV 10/5 40% no distress, wheezing or rhonchi ?GI: soft, bsx4 active  ?Extremities: warm/dry, no edema  ?Skin: no rashes or lesions ? ? ? ? ?Labs reviewed ?Creatinine 2.1 ?Platelets 98 (79 yesterday) ? ? ?Resolved Hospital Problem list   ? ? ?Assessment & Plan:  ? ? ?Acute encephalopathy  ?Seizure like activity ?Repeat CT head was stable ?EEG with no further seizures ?-MRI this morning, then extubate ?-Appreciate Neuro input ?-AEDs per Neuro: Jodi Marble, wean Propofol and Versed ? ? ? ?Acute hypoxic resp failure in the setting of seizure activity ?-tolerating PSV today ?-plan to extubate after MRI ? ?Atrial Fibrillation  ?-continue amiodarone and heparin ?-converted to sinus rhythm this morning ?-transition to home coreg when taking po's  ? ? ? ?Thrombocytopenia ?Platelets slightly improved today, 123 one month ago ?-monitor, may  need to stop heparin ?-no sign of bleeding currently ? ?HFpEF, HTN, HL ?-no acute exacerbation ?-continue statin ? ?T2dm:  ?-continue SSI ?-a1c in 11/22 =6.2 ? ?H/o CVA:  ?-Plavix, MRI today ? ?CKD III-IV ?Creatinine near baseline  ?-continue to monitor renal indices and UOP ? ?Metabolic acidosis: ? improving ?S/p 2 amps bicarb. Normal LA ? ? ? ?Best Practice (right click and "Reselect all SmartList Selections" daily)  ? ?Diet/type: NPO w/ meds via tube ?DVT  prophylaxis: SCD ?GI prophylaxis: PPI ?Lines: N/A ?Foley:  Yes, and it is still needed ?Code Status:  full code ?Last date of multidisciplinary goals of care discussion [pending, pt improving, will try to reach son again today] ? ?Labs   ?CBC: ?Recent Labs  ?Lab 09/02/21 ?2351 09/03/21 ?0650 09/03/21 ?2026 09/04/21 ?4098 09/05/21 ?0615 09/06/21 ?0319  ?WBC 10.2 5.5  --  8.4 10.3 8.4  ?NEUTROABS 3.4  --   --   --   --   --   ?HGB 11.9* 9.0* 9.2* 10.2* 12.5 10.5*  ?HCT 38.9 29.2* 27.0* 30.4* 38.6 33.1*  ?MCV 90.7 93.6  --  86.6 86.9 88.0  ?PLT 145* 85*  --  102* 79* 98*  ? ? ? ?Basic Metabolic Panel: ?Recent Labs  ?Lab 09/02/21 ?2351 09/03/21 ?1191 09/03/21 ?2026 09/04/21 ?4782 09/05/21 ?9562 09/06/21 ?0319  ?NA 141 140 146* 144 144 143  ?K 5.0 5.2* 4.8 4.6 4.9 4.8  ?CL 114* 120*  --  121* 118* 118*  ?CO2 15* 14*  --  19* 17* 18*  ?GLUCOSE 207* 115*  --  155* 154* 138*  ?BUN 31* 27*  --  24* 28* 35*  ?CREATININE 2.28* 2.01*  --  1.98* 1.98* 2.12*  ?CALCIUM 9.2 8.7*  --  8.8* 8.4* 8.6*  ?MG  --  1.8  --  1.8  --   --   ?PHOS  --  3.4  --   --   --   --   ? ? ?GFR: ?Estimated Creatinine Clearance: 25 mL/min (A) (by C-G formula based on SCr of 2.12 mg/dL (H)). ?Recent Labs  ?Lab 09/03/21 ?0650 09/03/21 ?0934 09/04/21 ?1308 09/05/21 ?0615 09/06/21 ?0319  ?WBC 5.5  --  8.4 10.3 8.4  ?LATICACIDVEN 1.0 0.9  --   --   --   ? ? ? ?Liver Function Tests: ?Recent Labs  ?Lab 09/02/21 ?2351  ?AST 29  ?ALT 12  ?ALKPHOS 187*  ?BILITOT 0.6  ?PROT 8.2*  ?ALBUMIN 3.7  ? ? ?No results for input(s): LIPASE, AMYLASE in the last 168 hours. ?No results for input(s): AMMONIA in the last 168 hours. ? ?ABG ?   ?Component Value Date/Time  ? PHART 7.380 09/03/2021 2026  ? PCO2ART 32.1 09/03/2021 2026  ? PO2ART 133 (H) 09/03/2021 2026  ? HCO3 19.0 (L) 09/03/2021 2026  ? TCO2 20 (L) 09/03/2021 2026  ? ACIDBASEDEF 5.0 (H) 09/03/2021 2026  ? O2SAT 99 09/03/2021 2026  ? ?  ? ?Coagulation Profile: ?Recent Labs  ?Lab 09/02/21 ?2351  ?INR 1.2   ? ? ? ?Cardiac Enzymes: ?No results for input(s): CKTOTAL, CKMB, CKMBINDEX, TROPONINI in the last 168 hours. ? ?HbA1C: ?Hgb A1c MFr Bld  ?Date/Time Value Ref Range Status  ?09/03/2021 06:50 AM 6.0 (H) 4.8 - 5.6 % Final  ?  Comment:  ?  REPEATED TO VERIFY ?(NOTE) ?Pre diabetes:          5.7%-6.4% ? ?Diabetes:              >  6.4% ? ?Glycemic control for   <7.0% ?adults with diabetes ?  ?03/09/2021 06:26 AM 6.2 (H) 4.8 - 5.6 % Final  ?  Comment:  ?  (NOTE) ?Pre diabetes:          5.7%-6.4% ? ?Diabetes:              >6.4% ? ?Glycemic control for   <7.0% ?adults with diabetes ?  ? ? ?CBG: ?Recent Labs  ?Lab 09/05/21 ?1525 09/05/21 ?2013 09/05/21 ?2359 09/06/21 ?6283 09/06/21 ?6629  ?GLUCAP 154* 156* 134* 127* 141*  ? ? ? ?Critical care time: 35 min  ? ?CRITICAL CARE ?Performed by: Otilio Carpen Reeve Mallo ? ? ?Total critical care time: 35 minutes ? ?Critical care time was exclusive of separately billable procedures and treating other patients. ? ?Critical care was necessary to treat or prevent imminent or life-threatening deterioration. ? ?Critical care was time spent personally by me on the following activities: development of treatment plan with patient and/or surrogate as well as nursing, discussions with consultants, evaluation of patient's response to treatment, examination of patient, obtaining history from patient or surrogate, ordering and performing treatments and interventions, ordering and review of laboratory studies, ordering and review of radiographic studies, pulse oximetry and re-evaluation of patient's condition. ? ?Otilio Carpen Kashish Yglesias, PA-C ?[facility] ?See Amion for pager ?If no response to pager , please call [phone] until 7pm ?After 7:00 pm call Elink  [phone] ? ?  ?

## 2021-09-07 ENCOUNTER — Ambulatory Visit: Payer: 59 | Admitting: Internal Medicine

## 2021-09-07 LAB — GLUCOSE, CAPILLARY
Glucose-Capillary: 110 mg/dL — ABNORMAL HIGH (ref 70–99)
Glucose-Capillary: 84 mg/dL (ref 70–99)
Glucose-Capillary: 84 mg/dL (ref 70–99)
Glucose-Capillary: 86 mg/dL (ref 70–99)
Glucose-Capillary: 96 mg/dL (ref 70–99)
Glucose-Capillary: 98 mg/dL (ref 70–99)

## 2021-09-07 LAB — HEPARIN LEVEL (UNFRACTIONATED): Heparin Unfractionated: 0.36 IU/mL (ref 0.30–0.70)

## 2021-09-07 LAB — BASIC METABOLIC PANEL
Anion gap: 7 (ref 5–15)
BUN: 34 mg/dL — ABNORMAL HIGH (ref 8–23)
CO2: 19 mmol/L — ABNORMAL LOW (ref 22–32)
Calcium: 8.8 mg/dL — ABNORMAL LOW (ref 8.9–10.3)
Chloride: 118 mmol/L — ABNORMAL HIGH (ref 98–111)
Creatinine, Ser: 2.05 mg/dL — ABNORMAL HIGH (ref 0.44–1.00)
GFR, Estimated: 26 mL/min — ABNORMAL LOW (ref >60)
Glucose, Bld: 94 mg/dL (ref 70–99)
Potassium: 5.1 mmol/L (ref 3.5–5.1)
Sodium: 144 mmol/L (ref 135–145)

## 2021-09-07 LAB — CBC
HCT: 29.6 % — ABNORMAL LOW (ref 36.0–46.0)
Hemoglobin: 9.5 g/dL — ABNORMAL LOW (ref 12.0–15.0)
MCH: 28.3 pg (ref 26.0–34.0)
MCHC: 32.1 g/dL (ref 30.0–36.0)
MCV: 88.1 fL (ref 80.0–100.0)
Platelets: 100 10*3/uL — ABNORMAL LOW (ref 150–400)
RBC: 3.36 MIL/uL — ABNORMAL LOW (ref 3.87–5.11)
RDW: 15 % (ref 11.5–15.5)
WBC: 7.4 10*3/uL (ref 4.0–10.5)
nRBC: 0 % (ref 0.0–0.2)

## 2021-09-07 LAB — VITAMIN B1: Vitamin B1 (Thiamine): 120.9 nmol/L (ref 66.5–200.0)

## 2021-09-07 LAB — MAGNESIUM: Magnesium: 1.8 mg/dL (ref 1.7–2.4)

## 2021-09-07 LAB — PHOSPHORUS: Phosphorus: 4.5 mg/dL (ref 2.5–4.6)

## 2021-09-07 LAB — TRIGLYCERIDES: Triglycerides: 41 mg/dL (ref <150)

## 2021-09-07 LAB — APTT: aPTT: 51 seconds — ABNORMAL HIGH (ref 24–36)

## 2021-09-07 LAB — METHYLMALONIC ACID, SERUM: Methylmalonic Acid, Quantitative: 1056 nmol/L — ABNORMAL HIGH (ref 0–378)

## 2021-09-07 MED ORDER — BETHANECHOL CHLORIDE 10 MG PO TABS
10.0000 mg | ORAL_TABLET | Freq: Three times a day (TID) | ORAL | Status: AC
  Administered 2021-09-07 – 2021-09-09 (×8): 10 mg via ORAL
  Filled 2021-09-07 (×9): qty 1

## 2021-09-07 MED ORDER — DOCUSATE SODIUM 100 MG PO CAPS
100.0000 mg | ORAL_CAPSULE | Freq: Two times a day (BID) | ORAL | Status: DC
  Administered 2021-09-08 – 2021-09-11 (×6): 100 mg via ORAL
  Filled 2021-09-07 (×10): qty 1

## 2021-09-07 MED ORDER — ATORVASTATIN CALCIUM 80 MG PO TABS
80.0000 mg | ORAL_TABLET | Freq: Every day | ORAL | Status: DC
  Administered 2021-09-08 – 2021-09-12 (×5): 80 mg via ORAL
  Filled 2021-09-07 (×5): qty 1

## 2021-09-07 MED ORDER — POLYETHYLENE GLYCOL 3350 17 G PO PACK
17.0000 g | PACK | Freq: Every day | ORAL | Status: DC
  Administered 2021-09-09: 17 g via ORAL
  Filled 2021-09-07 (×4): qty 1

## 2021-09-07 MED ORDER — MAGNESIUM SULFATE 2 GM/50ML IV SOLN
2.0000 g | Freq: Once | INTRAVENOUS | Status: AC
  Administered 2021-09-07: 2 g via INTRAVENOUS
  Filled 2021-09-07: qty 50

## 2021-09-07 MED ORDER — CLOPIDOGREL BISULFATE 75 MG PO TABS
75.0000 mg | ORAL_TABLET | Freq: Every day | ORAL | Status: DC
  Administered 2021-09-08 – 2021-09-12 (×5): 75 mg via ORAL
  Filled 2021-09-07 (×5): qty 1

## 2021-09-07 MED ORDER — BETHANECHOL CHLORIDE 10 MG PO TABS: 10.0000 mg | ORAL_TABLET | Freq: Three times a day (TID) | ORAL | Status: DC

## 2021-09-07 NOTE — Progress Notes (Signed)
Subjective: ?Extubated yesterday ? ? ?Exam: ?Vitals:  ? 09/07/21 0700 09/07/21 0749  ?BP: (!) 122/56   ?Pulse: 65   ?Resp: 19   ?Temp:  97.6 ?F (36.4 ?C)  ?SpO2: 95%   ? ?Gen: In bed, extubated ? ? ?Neuro: ?MS: awake, alert, gives April as the month(was April when she was intubated). Knows she is at [facility]. ?CN: Pupils are reactive bilaterally, eyes cross midline in both directions, She has a left hemianopia, ? Mild decrease NL fold on the right.  ?Motor: She lifts both arm s against gravity, She is limited in RLE due to knee pain.  ?Sensory: intact to LT ? ?Pertinent Labs: ?Creatinine 2.05 ? ? ?Impression: 71 year old female with a history of atrial fibrillation on Eliquis, hypertension, heart failure with preserved EF, bilateral PCA strokes.  She had left gaze reportedly with nystagmus and left weakness and there was concern for seizure.  Due to a GCS of three, she was intubated.  On initial evaluation by teleneurology, she had no movement on her left side. With negative MRI, and a history of cortical insult, I think seizure is by far the most likely diagnosis. At this point, would continue keppra indefinitely.  ? ?Recommendations: ?1) Keppra 500mg  BID ?2) Neurology will be available as needed moving forward.  ? ?Roland Rack, MD ?[facility] ?[phone] ? ?If 7pm- 7am, please page neurology on call as listed in [place]. ? ?

## 2021-09-07 NOTE — Evaluation (Signed)
Clinical/Bedside Swallow Evaluation ?Patient Details  ?Name: Susan Mclean ?MRN: [id number] ?Date of Birth: Mar 30, 1951 ? ?Today's Date: 09/07/2021 ?Time: SLP Start Time (ACUTE ONLY): 1093 SLP Stop Time (ACUTE ONLY): 1151 ?SLP Time Calculation (min) (ACUTE ONLY): 18 min ? ?Past Medical History:  ?Past Medical History:  ?Diagnosis Date  ? Atrial fibrillation ([place])   ? CHF (congestive heart failure) ([place])   ? DVT (deep venous thrombosis) ([place])   ? Left lower extremity  ? HTN (hypertension)   ? Hypertension   ? MI (myocardial infarction) ([place])   ? ?Past Surgical History:  ?Past Surgical History:  ?Procedure Laterality Date  ? CORONARY STENT INTERVENTION N/A 03/09/2021  ? Procedure: CORONARY STENT INTERVENTION;  Surgeon: Nelva Bush, MD;  Location: [facility];  Service: Cardiovascular;  Laterality: N/A;  ? CORONARY STENT PLACEMENT    ? LEFT HEART CATH AND CORONARY ANGIOGRAPHY N/A 03/09/2021  ? Procedure: LEFT HEART CATH AND CORONARY ANGIOGRAPHY;  Surgeon: Nelva Bush, MD;  Location: [facility];  Service: Cardiovascular;  Laterality: N/A;  ? ?HPI:  ?Pt is a 71 y.o. who presents 09/03/2021 with seizure like activity; pt unresponsive with L eye deviation, L weakness and nystagmus.  CTH negative for acute cva but showed chronic b/l PCA strokes R >L. MRI negative for acute infarct. Intubated 4/29-5/2.  ?  ?Assessment / Plan / Recommendation  ?Clinical Impression ? Pt exhibits a likely acute reversible dysphagia versus effects from seizure and or chronic infarcts. She coughed immediately after water with delayed throat clear indicating probable airway intrusion mediated by nectar thick liquid. She is missing most dentition but mastication of solids was timely. She chose to have meats chopped after explanation of various textures. Recommend likely short term use of thickener to nectar consistency, pills whole in puree and continued ST. ?SLP Visit Diagnosis: Dysphagia, unspecified (R13.10) ?   ?Aspiration  Risk ? Mild aspiration risk  ?  ?Diet Recommendation Dysphagia 3 (Mech soft);Nectar-thick liquid  ? ?Liquid Administration via: Straw;Cup ?Medication Administration: Whole meds with puree ?Supervision: Staff to assist with self feeding (possibly due to visual disturbance) ?Compensations: Small sips/bites;Slow rate ?Postural Changes: Seated upright at 90 degrees  ?  ?Other  Recommendations Oral Care Recommendations: Oral care BID   ? ?Recommendations for follow up therapy are one component of a multi-disciplinary discharge planning process, led by the attending physician.  Recommendations may be updated based on patient status, additional functional criteria and insurance authorization. ? ?Follow up Recommendations No SLP follow up  ? ? ?  ?Assistance Recommended at Discharge  (TBD)  ?Functional Status Assessment Patient has had a recent decline in their functional status and demonstrates the ability to make significant improvements in function in a reasonable and predictable amount of time.  ?Frequency and Duration min 2x/week  ?2 weeks ?  ?   ? ?Prognosis Prognosis for Safe Diet Advancement: Good  ? ?  ? ?Swallow Study   ?General Date of Onset: 09/03/21 ?HPI: Pt is a 72 y.o. who presents 09/03/2021 with seizure like activity; pt unresponsive with L eye deviation, L weakness and nystagmus.  CTH negative for acute cva but showed chronic b/l PCA strokes R >L. MRI negative for acute infarct. Intubated 4/29-5/2. ?Type of Study: Bedside Swallow Evaluation ?Previous Swallow Assessment:  (none) ?Diet Prior to this Study: NPO ?Temperature Spikes Noted: No ?Respiratory Status: Nasal cannula ?History of Recent Intubation: Yes ?Length of Intubations (days): 4 days ?Date extubated: 09/06/21 ?Behavior/Cognition: Alert;Cooperative;Pleasant mood ?Oral Cavity Assessment: Other (comment) (lingual  candida) ?Oral Care Completed by SLP: No ?Oral Cavity - Dentition: Missing dentition (missing upper, has approx 5 lower teeth) ?Vision:   (vision is impaired -pt thinks she may be able to self feed) ?Self-Feeding Abilities: Needs assist ?Patient Positioning: Upright in chair ?Baseline Vocal Quality: Hoarse;Low vocal intensity ?Volitional Cough: Strong ?Volitional Swallow: Able to elicit  ?  ?Oral/Motor/Sensory Function Overall Oral Motor/Sensory Function: Within functional limits   ?Ice Chips Ice chips: Not tested   ?Thin Liquid Thin Liquid: Impaired ?Presentation: Cup ?Oral Phase Impairments:  (none) ?Pharyngeal  Phase Impairments: Cough - Immediate;Throat Clearing - Delayed  ?  ?Nectar Thick Nectar Thick Liquid: Within functional limits ?Presentation: Cup;Straw   ?Honey Thick Honey Thick Liquid: Not tested   ?Puree Puree: Within functional limits   ?Solid ? ? ?  Solid: Within functional limits  ? ?  ? ?Houston Siren ?09/07/2021,1:01 PM ? ? ? ?

## 2021-09-07 NOTE — Evaluation (Signed)
Occupational Therapy Evaluation ?Patient Details ?Name: Susan Mclean ?MRN: [id number] ?DOB: 1950/08/26 ?Today's Date: 09/07/2021 ? ? ?History of Present Illness Pt is a 71 y.o. F who presents 09/03/2021 with seizure like activity; pt unresponsive with L eye deviation, L weakness and nystagmus.  CTH negative for acute cva but showed chronic b/l PCA strokes R >L. MRI negative for acute infarct. Intubated 4/29-5/3.  ? ?Clinical Impression ?  ?Patient admitted for the diagnosis above.  PTA she lives with her son, has a history of falls, was able to bathe and dress herself, and needed assist from her son for melas, community mobility and home management.  Deficits impacting independence are listed below.  OT will follow in the acute setting with AIR being recommended initially, but patient may need SNF level of care to maximize her functional status prior to returning home.     ?   ? ?Recommendations for follow up therapy are one component of a multi-disciplinary discharge planning process, led by the attending physician.  Recommendations may be updated based on patient status, additional functional criteria and insurance authorization.  ? ?Follow Up Recommendations ? Skilled nursing-short term rehab (<3 hours/day)  ?  ?Assistance Recommended at Discharge Frequent or constant Supervision/Assistance  ?Patient can return home with the following A lot of help with bathing/dressing/bathroom;A lot of help with walking and/or transfers;Help with stairs or ramp for entrance;Assist for transportation;Assistance with cooking/housework ? ?  ?Functional Status Assessment ? Patient has had a recent decline in their functional status and demonstrates the ability to make significant improvements in function in a reasonable and predictable amount of time.  ?Equipment Recommendations ? None recommended by OT  ?  ?Recommendations for Other Services   ? ? ?  ?Precautions / Restrictions Precautions ?Precautions: Fall ?Restrictions ?Weight  Bearing Restrictions: No  ? ?  ? ?Mobility Bed Mobility ?Overal bed mobility: Needs Assistance ?Bed Mobility: Supine to Sit, Sit to Supine ?  ?  ?Supine to sit: Min assist ?Sit to supine: Mod assist ?  ?  ?Patient Response: Cooperative ? ?Transfers ?Overall transfer level: Needs assistance ?  ?  ?  ?  ?  ?  ?  ?  ?  ?  ? ?  ?Balance Overall balance assessment: Needs assistance ?Sitting-balance support: Feet supported ?Sitting balance-Leahy Scale: Fair ?  ?Postural control: Posterior lean ?  ?  ?  ?  ?  ?  ?  ?  ?  ?  ?  ?  ?  ?  ?   ? ?ADL either performed or assessed with clinical judgement  ? ?ADL   ?  ?  ?Grooming: Wash/dry hands;Wash/dry face;Set up;Bed level ?  ?  ?  ?Lower Body Bathing: Maximal assistance;Bed level ?  ?Upper Body Dressing : Minimal assistance;Sitting ?  ?Lower Body Dressing: Maximal assistance;Sitting/lateral leans ?  ?  ?  ?  ?  ?  ?  ?  ?   ? ? ? ?Vision   ?Vision Assessment?: No apparent visual deficits;Vision impaired- to be further tested in functional context  ?   ?Perception Perception ?Perception: Not tested ?  ?Praxis Praxis ?Praxis: Not tested ?  ? ?Pertinent Vitals/Pain Pain Assessment ?Pain Assessment: Faces ?Faces Pain Scale: Hurts little more ?Pain Location: both legs ?Pain Descriptors / Indicators: Aching ?Pain Intervention(s): Monitored during session  ? ? ? ?Hand Dominance Right ?  ?Extremity/Trunk Assessment Upper Extremity Assessment ?Upper Extremity Assessment: Generalized weakness ?  ?Lower Extremity Assessment ?Lower Extremity Assessment: Defer to PT  evaluation ?  ?Cervical / Trunk Assessment ?Cervical / Trunk Assessment: Kyphotic ?  ?Communication Communication ?Communication: No difficulties ?  ?Cognition Arousal/Alertness: Awake/alert ?Behavior During Therapy: [facility] for tasks assessed/performed ?Overall Cognitive Status: Impaired/Different from baseline ?  ?  ?  ?  ?  ?  ?  ?  ?  ?  ?  ?Memory: Decreased short-term memory ?Following Commands: Follows one step commands  inconsistently, Follows one step commands with increased time ?  ?  ?  ?  ?  ?  ?General Comments  VSS on O2 ? ?  ?Exercises   ?  ?Shoulder Instructions    ? ? ?Home Living Family/patient expects to be discharged to:: Private residence ?Living Arrangements: Children ?Available Help at Discharge: Family;Available PRN/intermittently ?Type of Home: House ?Home Access: Stairs to enter ?Entrance Stairs-Number of Steps: 2-3 ?Entrance Stairs-Rails: None ?Home Layout: One level ?  ?  ?Bathroom Shower/Tub: Walk-in shower ?  ?Bathroom Toilet: Standard ?Bathroom Accessibility: Yes ?How Accessible: Accessible via walker ?Home Equipment: Rollator (4 wheels) ?  ?  ?  ? ?  ?Prior Functioning/Environment Prior Level of Function : Needs assist ?  ?  ?  ?Physical Assist : ADLs (physical) ?  ?ADLs (physical): IADLs ?Mobility Comments: Per PT eval:uses Rollator, reports 6 falls in past couple of months ?ADLs Comments: Able to care for bathing and dressing, son assists with meals, community mobility and IADL.  She will assist with some home management tasks ?  ? ?  ?  ?OT Problem List: Decreased strength;Decreased activity tolerance;Impaired balance (sitting and/or standing);Decreased safety awareness;Decreased knowledge of use of DME or AE;Pain;Decreased cognition ?  ?   ?OT Treatment/Interventions: Self-care/ADL training;Therapeutic exercise;Patient/family education;Balance training;Therapeutic activities;DME and/or AE instruction;Energy conservation  ?  ?OT Goals(Current goals can be found in the care plan section) Acute Rehab OT Goals ?Patient Stated Goal: Get to go home ?OT Goal Formulation: With patient ?Time For Goal Achievement: 09/21/21 ?Potential to Achieve Goals: Good ?ADL Goals ?Pt Will Perform Grooming: with supervision;sitting ?Pt Will Perform Upper Body Dressing: with min guard assist;sitting ?Pt Will Perform Lower Body Dressing: with min assist;sitting/lateral leans ?Pt Will Transfer to Toilet: with min guard  assist;stand pivot transfer;bedside commode ?Pt/caregiver will Perform Home Exercise Program: Increased strength;Both right and left upper extremity;With Supervision;With written HEP provided  ?OT Frequency: Min 2X/week ?  ? ?Co-evaluation   ?  ?  ?  ?  ? ?  ?AM-PAC OT "6 Clicks" Daily Activity     ?Outcome Measure Help from another person eating meals?: None ?Help from another person taking care of personal grooming?: None ?Help from another person toileting, which includes using toliet, bedpan, or urinal?: A Lot ?Help from another person bathing (including washing, rinsing, drying)?: A Lot ?Help from another person to put on and taking off regular upper body clothing?: A Lot ?Help from another person to put on and taking off regular lower body clothing?: A Lot ?6 Click Score: 16 ?  ?End of Session Equipment Utilized During Treatment: Oxygen ?Nurse Communication: Mobility status ? ?Activity Tolerance: Patient limited by fatigue ?Patient left: in bed;with call bell/phone within reach ? ?OT Visit Diagnosis: Unsteadiness on feet (R26.81);Repeated falls (R29.6);Muscle weakness (generalized) (M62.81);Other symptoms and signs involving cognitive function  ?              ?Time: 1210-1227 ?OT Time Calculation (min): 17 min ?Charges:  OT General Charges ?$OT Visit: 1 Visit ?OT Evaluation ?$OT Eval Moderate Complexity: 1 Mod ? ?09/07/2021 ? ?RP, OTR/L ? ?  Acute Rehabilitation Services ? ?Office:  [phone] ? ? ?Iden Stripling D Ronella Plunk ?09/07/2021, 4:24 PM ?

## 2021-09-07 NOTE — Progress Notes (Signed)
Inpatient Rehab Admissions Coordinator:  ? ?Per therapy recommendations, patient was screened for CIR candidacy by Clemens Catholic, MS, CCC-SLP. At this time, Pt. does not appear to demonstrate medical necessity to justify in hospital rehabilitation/CIR as Pt's payor will not approve CIR for her diagnoses . I will not pursue a rehab consult for this Pt.   Recommend other rehab venues to be pursued.  Please contact me with any questions. ? ?Clemens Catholic, MS, CCC-SLP ?Rehab Admissions Coordinator  ?[phone] (celll) ?[phone] (office) ? ?

## 2021-09-07 NOTE — Progress Notes (Signed)
ANTICOAGULATION CONSULT NOTE  ? ?Pharmacy Consult for Heparin  ?Indication: hx atrial fibrillation and DVT  ? ?No Known Allergies ? ?Patient Measurements: ?Height: 5\' 1"  (154.9 cm) ?Weight: 89 kg (196 lb 3.4 oz) ?IBW/kg (Calculated) : 47.8 ?Heparin dosing weight: 69.2 kg ? ? ?Vital Signs: ?Temp: 97.6 ?F (36.4 ?C) (05/03 0749) ?Temp Source: Oral (05/03 0749) ?BP: 122/56 (05/03 0700) ?Pulse Rate: 65 (05/03 0700) ? ?Labs: ?Recent Labs  ?  09/05/21 ?0615 09/05/21 ?1643 09/06/21 ?0319 09/06/21 ?1355 09/07/21 ?8657  ?HGB 12.5  --  10.5*  --  9.5*  ?HCT 38.6  --  33.1*  --  29.6*  ?PLT 79*  --  98*  --  100*  ?APTT 42*   < > 65* 66* 51*  ?HEPARINUNFRC 0.64  --  0.54  --  0.36  ?CREATININE 1.98*  --  2.12*  --  2.05*  ? < > = values in this interval not displayed.  ? ? ? ?Estimated Creatinine Clearance: 25.9 mL/min (A) (by C-G formula based on SCr of 2.05 mg/dL (H)). ? ? ?Medical History: ?Past Medical History:  ?Diagnosis Date  ? Atrial fibrillation ([place])   ? CHF (congestive heart failure) ([place])   ? DVT (deep venous thrombosis) ([place])   ? Left lower extremity  ? HTN (hypertension)   ? Hypertension   ? MI (myocardial infarction) ([place])   ? ? ? ? ?Assessment: ?70yof with Hx DVT and afib on apixaban pta admitted with mental status change and intubated for airway protection. Head CT negative for bleed  ?Pharmacy asked to dose heparin while off oral anticoagulation.   ? ?aPTT is below goal, still not correlating with heparin level, CBC is therapeutic.MRI with no acute changes. ? ?Goal of Therapy:  ?Heparin level 0.3-0.5 ?Aptt 66-85sec ?Monitor platelets by anticoagulation protocol: Yes ?  ?Plan:  ?Increase heparin 1250 units/h ?Planning to transition back to apixaban pending swallow study ?Daily aPTT, heparin level, CBC ? ? ?Arrie Senate, PharmD, BCPS, BCCP ?Clinical Pharmacist ?[phone] ?Please check AMION for all [facility] numbers ?09/07/2021 ? ? ?

## 2021-09-07 NOTE — Progress Notes (Addendum)
? ?NAME:  Susan Mclean, MRN:  [id number], DOB:  Sep 30, 1950, LOS: 4 ?ADMISSION DATE:  09/03/2021, CONSULTATION DATE:  09/03/21 ?REFERRING MD:  EDP, CHIEF COMPLAINT:  intubated  ? ?History of Present Illness:  ?71 yo female with pmh of afib on eliquis, h/o dvt, HFpEF, htn, dm2, cad, ntesmi, ckd who presented to osh unresponsive and with L sided eye deviation, L weakness and nystagmus. All history obtained from chart as pt is intubated and unresponsive.  ? ?She was reportedly last known normal at 2300. Presented to [facility]. GCS 3 and was emergently intubated. She had spontaneous movement of R and no movement on L. Her L gaze deviation resolved. She was started on propofol and loaded with Keppra. CTH negative for acute cva but showed chronic b/l PCA strokes R >L. Neurology contacted for transfer for LTM and accepted pt. Upon arrival CCM was called after neurology saw pt with concern for status. We were asked to admit pt and they consult.  ? ?Pertinent  Medical History  ? has a past medical history of Atrial fibrillation ([place]), CHF (congestive heart failure) ([place]), DVT (deep venous thrombosis) ([place]), HTN (hypertension), Hypertension, and MI (myocardial infarction) ([place]). ? ? ?Significant Hospital Events: ?Including procedures, antibiotic start and stop dates in addition to other pertinent events   ?Accepted in transfer by neurology ?Arrived in ed intubated at that time neurology had EDP contact ccm for admission and they became consultant.  ?5/1 No further seizures on EEG, weaning sedation and plan for MRI ?5/2 waking up and following commands  ?5/3 successfully extubated, alert and oriented ? ? ?Significant Studies: ? ?4/29 CT Head ?IMPRESSION ?No acute intracranial process. Redemonstrated areas of ?encephalomalacia in the right-greater-than-left PCA territory. ? ?4/29 CTV ?1. Short segment severe stenosis or occlusion in the distal right ?P1/proximal right P2, of indeterminate acuity given the patient's ?history  of prior right PCA territory. ?2. Moderate stenosis in the bilateral cavernous and supraclinoid ?segments. ?3.  No hemodynamically significant stenosis in the neck. ?4. No evidence of dural venous sinus stenosis or thrombosis. ?5. Mild enlargement of the main pulmonary artery, as can be seen ?with in the setting of pulmonary hypertension. ? ? ?4/30 CTH ?Stable ? ?5/2 MRI brain ?1. No emergent finding including acute infarct. ?2. Chronic small vessel disease and chronic bilateral occipital ?infarcts. ? ?Interim History / Subjective:  ? ?No seizure activity or overnight events  ?Weaning on PSV this morning ?SR this morning ? ?Objective   ?Blood pressure (!) 122/56, pulse 65, temperature 97.6 ?F (36.4 ?C), temperature source Oral, resp. rate 19, height 5\' 1"  (1.549 m), weight 89 kg, SpO2 95 %. ?   ?Vent Mode: PSV;CPAP ?FiO2 (%):  [30 %] 30 % ?PEEP:  [5 cmH20] 5 cmH20 ?Pressure Support:  [8 cmH20] 8 cmH20  ? ?Intake/Output Summary (Last 24 hours) at 09/07/2021 0859 ?Last data filed at 09/07/2021 2778 ?Gross per 24 hour  ?Intake 1060.06 ml  ?Output 1004 ml  ?Net 56.06 ml  ? ? ?Filed Weights  ? 09/03/21 2423 09/06/21 0442 09/07/21 0456  ?Weight: 91.2 kg 88.8 kg 89 kg  ? ? ?General:  elderly F, resting in bed in no acute distress ?HEENT: MM pink/moist, sclera anicteric ?Neuro: awake, oriented to person and date, disoriented to place, following commands ?CV: s1s2 irregular, no m/r/g ?PULM:  clear bilaterally on [place] without distress ?GI: soft, bsx4 active  ?Extremities: warm/dry, no edema  ?Skin: no rashes or lesions ? ? ? ? ?Labs reviewed ?Creatinine 2.1 ?Platelets 98 (  59 yesterday) ? ? ?Resolved Hospital Problem list   ? ? ?Assessment & Plan:  ? ? ?Acute encephalopathy  ?Seizure like activity ?Repeat CT head was stable ?EEG with no further seizures ?MRI with chronic bilateral occipital infarcts ?-Appreciate Neuro input, continue Keppra 500mg  bid ?-mental status much improved today, stable for transfer out of ICU ?-PT  consult ? ? ? ?Acute hypoxic resp failure in the setting of seizure activity ?-extubated to [place], wean as able ? ? ?Atrial Fibrillation  ?-swallow study today, then transition to home Eliquis and Coreg ? ? ? ?Thrombocytopenia ?Platelets slightly improved today, 123 one month ago ?-stable and improving, likely secondary to acute illness ? ?HFpEF, HTN, HL ?-no acute exacerbation ?-continue statin ?-resume Plavix when passes swallow  ? ?T2dm:  ?-continue SSI ?-a1c in 11/22 =6.2 ? ?H/o CVA:  ?-resume Plavix ? ?CKD III-IV ?Creatinine near baseline  ?-stable  ? ?Metabolic acidosis: ? improving ? ? ? ? ?Best Practice (right click and "Reselect all SmartList Selections" daily)  ? ?Diet/type: NPO w/ meds via tube swallow study today ?DVT prophylaxis: SCD heparin ?GI prophylaxis: PPI ?Lines: N/A ?Foley:  Yes, and it is no longer needed and removal ordered  ?Code Status:  full code ?Last date of multidisciplinary goals of care discussion [improving, full code, unable to reach son again 5/3 ] ? ?Labs   ?CBC: ?Recent Labs  ?Lab 09/02/21 ?2351 09/03/21 ?0650 09/03/21 ?2026 09/04/21 ?0254 09/05/21 ?0615 09/06/21 ?0319 09/07/21 ?2706  ?WBC 10.2 5.5  --  8.4 10.3 8.4 7.4  ?NEUTROABS 3.4  --   --   --   --   --   --   ?HGB 11.9* 9.0* 9.2* 10.2* 12.5 10.5* 9.5*  ?HCT 38.9 29.2* 27.0* 30.4* 38.6 33.1* 29.6*  ?MCV 90.7 93.6  --  86.6 86.9 88.0 88.1  ?PLT 145* 85*  --  102* 79* 98* 100*  ? ? ? ?Basic Metabolic Panel: ?Recent Labs  ?Lab 09/03/21 ?2376 09/03/21 ?2026 09/04/21 ?2831 09/05/21 ?0615 09/06/21 ?0319 09/07/21 ?5176  ?NA 140 146* 144 144 143 144  ?K 5.2* 4.8 4.6 4.9 4.8 5.1  ?CL 120*  --  121* 118* 118* 118*  ?CO2 14*  --  19* 17* 18* 19*  ?GLUCOSE 115*  --  155* 154* 138* 94  ?BUN 27*  --  24* 28* 35* 34*  ?CREATININE 2.01*  --  1.98* 1.98* 2.12* 2.05*  ?CALCIUM 8.7*  --  8.8* 8.4* 8.6* 8.8*  ?MG 1.8  --  1.8  --   --  1.8  ?PHOS 3.4  --   --   --   --  4.5  ? ? ?GFR: ?Estimated Creatinine Clearance: 25.9 mL/min (A) (by C-G  formula based on SCr of 2.05 mg/dL (H)). ?Recent Labs  ?Lab 09/03/21 ?0650 09/03/21 ?0934 09/04/21 ?1607 09/05/21 ?0615 09/06/21 ?0319 09/07/21 ?3710  ?WBC 5.5  --  8.4 10.3 8.4 7.4  ?LATICACIDVEN 1.0 0.9  --   --   --   --   ? ? ? ?Liver Function Tests: ?Recent Labs  ?Lab 09/02/21 ?2351  ?AST 29  ?ALT 12  ?ALKPHOS 187*  ?BILITOT 0.6  ?PROT 8.2*  ?ALBUMIN 3.7  ? ? ?No results for input(s): LIPASE, AMYLASE in the last 168 hours. ?No results for input(s): AMMONIA in the last 168 hours. ? ?ABG ?   ?Component Value Date/Time  ? PHART 7.380 09/03/2021 2026  ? PCO2ART 32.1 09/03/2021 2026  ? PO2ART 133 (H) 09/03/2021 2026  ?  HCO3 19.0 (L) 09/03/2021 2026  ? TCO2 20 (L) 09/03/2021 2026  ? ACIDBASEDEF 5.0 (H) 09/03/2021 2026  ? O2SAT 99 09/03/2021 2026  ? ?  ? ?Coagulation Profile: ?Recent Labs  ?Lab 09/02/21 ?2351  ?INR 1.2  ? ? ? ?Cardiac Enzymes: ?No results for input(s): CKTOTAL, CKMB, CKMBINDEX, TROPONINI in the last 168 hours. ? ?HbA1C: ?Hgb A1c MFr Bld  ?Date/Time Value Ref Range Status  ?09/03/2021 06:50 AM 6.0 (H) 4.8 - 5.6 % Final  ?  Comment:  ?  REPEATED TO VERIFY ?(NOTE) ?Pre diabetes:          5.7%-6.4% ? ?Diabetes:              >6.4% ? ?Glycemic control for   <7.0% ?adults with diabetes ?  ?03/09/2021 06:26 AM 6.2 (H) 4.8 - 5.6 % Final  ?  Comment:  ?  (NOTE) ?Pre diabetes:          5.7%-6.4% ? ?Diabetes:              >6.4% ? ?Glycemic control for   <7.0% ?adults with diabetes ?  ? ? ?CBG: ?Recent Labs  ?Lab 09/06/21 ?1542 09/06/21 ?1947 09/07/21 ?0021 09/07/21 ?0932 09/07/21 ?0746  ?GLUCAP 70 88 98 84 86  ? ? ? ?Critical care time: n/a  ? ?Susan Carpen Fatuma Dowers, PA-C ?[facility] ?See Amion for pager ?If no response to pager , please call [phone] until 7pm ?After 7:00 pm call Elink  [phone] ? ?  ?

## 2021-09-07 NOTE — Evaluation (Signed)
Physical Therapy Evaluation ?Patient Details ?Name: Susan Mclean ?MRN: [id number] ?DOB: 30-Jan-1951 ?Today's Date: 09/07/2021 ? ?History of Present Illness ? Pt is a 71 y.o. F who presents 09/03/2021 with seizure like activity; pt unresponsive with L eye deviation, L weakness and nystagmus.  CTH negative for acute cva but showed chronic b/l PCA strokes R >L. MRI negative for acute infarct. Intubated 4/29-5/3.  ?Clinical Impression ? PTA, pt lives with her son and is modI with a Radiation protection practitioner; pt reports 6 recent falls. Pt A&Ox3, not oriented to situation. Pt presents with decreased functional mobility secondary to generalized weakness, impaired standing balance, decreased activity tolerance and impaired cognition. Pt requiring heavy moderate assist to transfer from bed to chair using a walker. Pt unaware of bowel incontinence and PT provided peri care. Based on PLOF and motivation, suspect good progress. Recommend post acute rehab to address deficits and maximize functional mobility. ?   ? ?Recommendations for follow up therapy are one component of a multi-disciplinary discharge planning process, led by the attending physician.  Recommendations may be updated based on patient status, additional functional criteria and insurance authorization. ? ?Follow Up Recommendations Acute inpatient rehab (3hours/day) ? ?  ?Assistance Recommended at Discharge Frequent or constant Supervision/Assistance  ?Patient can return home with the following ? A lot of help with walking and/or transfers;A lot of help with bathing/dressing/bathroom;Assistance with cooking/housework;Assist for transportation;Help with stairs or ramp for entrance ? ?  ?Equipment Recommendations BSC/3in1;Wheelchair (measurements PT);Wheelchair cushion (measurements PT)  ?Recommendations for Other Services ?    ?  ?Functional Status Assessment Patient has had a recent decline in their functional status and demonstrates the ability to make significant improvements in  function in a reasonable and predictable amount of time.  ? ?  ?Precautions / Restrictions Precautions ?Precautions: Fall  ? ?  ? ?Mobility ? Bed Mobility ?Overal bed mobility: Needs Assistance ?Bed Mobility: Supine to Sit ?  ?  ?Supine to sit: Min guard ?  ?  ?General bed mobility comments: Pt progressing to long sitting without physical assist, coming to right side of bed without physical assist. Increased time/effort. ?  ? ?Transfers ?Overall transfer level: Needs assistance ?Equipment used: Rolling walker (2 wheels) ?Transfers: Sit to/from Stand, Bed to chair/wheelchair/BSC ?Sit to Stand: Mod assist ?Stand pivot transfers: Mod assist ?  ?  ?  ?  ?General transfer comment: Heavy modA to transfer to standing position, cues for hand placement and pivot towards right to chair. Increased time/effort. Additional stand from chair for peri care. Pt with flexed posture, difficulty with clearing feet, posterior lean ?  ? ?Ambulation/Gait ?  ?  ?  ?  ?  ?  ?  ?  ? ?Stairs ?  ?  ?  ?  ?  ? ?Wheelchair Mobility ?  ? ?Modified Rankin (Stroke Patients Only) ?  ? ?  ? ?Balance Overall balance assessment: Needs assistance ?Sitting-balance support: Feet supported ?Sitting balance-Leahy Scale: Fair ?  ?  ?Standing balance support: Bilateral upper extremity supported ?Standing balance-Leahy Scale: Poor ?Standing balance comment: heavily reliant on external support ?  ?  ?  ?  ?  ?  ?  ?  ?  ?  ?  ?   ? ? ? ?Pertinent Vitals/Pain Pain Assessment ?Pain Assessment: No/denies pain  ? ? ?Home Living Family/patient expects to be discharged to:: Private residence ?Living Arrangements: Children (son) ?Available Help at Discharge: Family;Available PRN/intermittently ?Type of Home: House ?Home Access: Stairs to enter ?Entrance Stairs-Rails: None ?Entrance Stairs-Number  of Steps: 2-3 ?  ?Home Layout: One level ?Home Equipment: Rollator (4 wheels) ?   ?  ?Prior Function Prior Level of Function : Needs assist ?  ?  ?  ?  ?  ?  ?Mobility  Comments: uses Rollator, reports 6 falls in past couple of months ?ADLs Comments: Pt completes ADL's and some IADL's (i.e. laundry, cleaning) with increased time. Pt son does cooking ?  ? ? ?Hand Dominance  ? Dominant Hand: Right ? ?  ?Extremity/Trunk Assessment  ?   ?  ? ?Lower Extremity Assessment ?Lower Extremity Assessment: Generalized weakness;RLE deficits/detail;LLE deficits/detail ?RLE Deficits / Details: Knee extension 2/5, ankle dorsiflexion at least 3/5 ?LLE Deficits / Details: Knee extension 2/5, ankle dorsiflexion 1/5 (pt reports hx of foot drop?) ?  ? ?Cervical / Trunk Assessment ?Cervical / Trunk Assessment: Kyphotic  ?Communication  ? Communication: Other (comment) (soft spoken)  ?Cognition Arousal/Alertness: Awake/alert ?Behavior During Therapy: [facility] for tasks assessed/performed ?Overall Cognitive Status: Impaired/Different from baseline ?Area of Impairment: Memory, Following commands ?  ?  ?  ?  ?  ?  ?  ?  ?  ?  ?Memory: Decreased short-term memory ?Following Commands: Follows one step commands with increased time ?  ?  ?  ?General Comments: A&Ox3, initially stating she was in hospital for having a stroke. Very pleasant, initiating tasks well. Unaware of having a BM ?  ?  ? ?  ?General Comments General comments (skin integrity, edema, etc.): BP 129/48 (74), SpO2 > 90% on 4L O2, HR 63 ? ?  ?Exercises    ? ?Assessment/Plan  ?  ?PT Assessment Patient needs continued PT services  ?PT Problem List Decreased strength;Decreased activity tolerance;Decreased balance;Decreased mobility;Decreased cognition;Decreased safety awareness ? ?   ?  ?PT Treatment Interventions DME instruction;Gait training;Functional mobility training;Therapeutic activities;Therapeutic exercise;Balance training;Patient/family education   ? ?PT Goals (Current goals can be found in the Care Plan section)  ?Acute Rehab PT Goals ?Patient Stated Goal: did not state ?PT Goal Formulation: With patient ?Time For Goal Achievement:  09/21/21 ?Potential to Achieve Goals: Good ? ?  ?Frequency Min 3X/week ?  ? ? ?Co-evaluation   ?  ?  ?  ?  ? ? ?  ?AM-PAC PT "6 Clicks" Mobility  ?Outcome Measure Help needed turning from your back to your side while in a flat bed without using bedrails?: A Little ?Help needed moving from lying on your back to sitting on the side of a flat bed without using bedrails?: A Little ?Help needed moving to and from a bed to a chair (including a wheelchair)?: A Little ?Help needed standing up from a chair using your arms (e.g., wheelchair or bedside chair)?: A Lot ?Help needed to walk in hospital room?: Total ?Help needed climbing 3-5 steps with a railing? : Total ?6 Click Score: 13 ? ?  ?End of Session Equipment Utilized During Treatment: Gait belt;Oxygen ?Activity Tolerance: Patient tolerated treatment well ?Patient left: in chair;with call bell/phone within reach;with chair alarm set ?Nurse Communication: Mobility status ?PT Visit Diagnosis: Unsteadiness on feet (R26.81);Muscle weakness (generalized) (M62.81);Difficulty in walking, not elsewhere classified (R26.2) ?  ? ?Time: 2671-2458 ?PT Time Calculation (min) (ACUTE ONLY): 35 min ? ? ?Charges:   PT Evaluation ?$PT Eval Moderate Complexity: 1 Mod ?PT Treatments ?$Therapeutic Activity: 8-22 mins ?  ?   ? ? ?Wyona Almas, PT, DPT ?Acute Rehabilitation Services ?Pager [phone] ?Office [phone] ? ? ?Deno Etienne ?09/07/2021, 12:09 PM ? ?

## 2021-09-07 NOTE — Progress Notes (Signed)
eLink Physician-Brief Progress Note ?Patient Name: Susan Mclean ?DOB: 1951/02/21 ?MRN: [id number] ? ? ?Date of Service ? 09/07/2021  ?HPI/Events of Note ? Patient with urinary retention.  ?eICU Interventions ? Acute Post Indwelling Urinary Catheter Retention Order Set ordered.  ? ? ? ?  ? ?Kerry Kass [name] ?09/07/2021, 4:02 AM ?

## 2021-09-07 NOTE — Progress Notes (Signed)
eLink Physician-Brief Progress Note ?Patient Name: Susan Mclean ?DOB: 1950-12-05 ?MRN: [id number] ? ? ?Date of Service ? 09/07/2021  ?HPI/Events of Note ? Mg++ 1.8  ?eICU Interventions ? Magnesium sulfate 2 gm iv x 1 ordered.  ? ? ? ?  ? ?[name] ?09/07/2021, 3:27 AM ?

## 2021-09-08 DIAGNOSIS — J9601 Acute respiratory failure with hypoxia: Secondary | ICD-10-CM

## 2021-09-08 DIAGNOSIS — R569 Unspecified convulsions: Secondary | ICD-10-CM

## 2021-09-08 LAB — BASIC METABOLIC PANEL
Anion gap: 6 (ref 5–15)
BUN: 29 mg/dL — ABNORMAL HIGH (ref 8–23)
CO2: 20 mmol/L — ABNORMAL LOW (ref 22–32)
Calcium: 9 mg/dL (ref 8.9–10.3)
Chloride: 118 mmol/L — ABNORMAL HIGH (ref 98–111)
Creatinine, Ser: 1.87 mg/dL — ABNORMAL HIGH (ref 0.44–1.00)
GFR, Estimated: 29 mL/min — ABNORMAL LOW (ref >60)
Glucose, Bld: 93 mg/dL (ref 70–99)
Potassium: 4.8 mmol/L (ref 3.5–5.1)
Sodium: 144 mmol/L (ref 135–145)

## 2021-09-08 LAB — CBC
HCT: 28.6 % — ABNORMAL LOW (ref 36.0–46.0)
Hemoglobin: 9.1 g/dL — ABNORMAL LOW (ref 12.0–15.0)
MCH: 27.7 pg (ref 26.0–34.0)
MCHC: 31.8 g/dL (ref 30.0–36.0)
MCV: 87.2 fL (ref 80.0–100.0)
Platelets: 105 10*3/uL — ABNORMAL LOW (ref 150–400)
RBC: 3.28 MIL/uL — ABNORMAL LOW (ref 3.87–5.11)
RDW: 14.9 % (ref 11.5–15.5)
WBC: 5.6 10*3/uL (ref 4.0–10.5)
nRBC: 0 % (ref 0.0–0.2)

## 2021-09-08 LAB — CULTURE, BLOOD (ROUTINE X 2)
Culture: NO GROWTH
Culture: NO GROWTH
Special Requests: ADEQUATE

## 2021-09-08 LAB — GLUCOSE, CAPILLARY
Glucose-Capillary: 117 mg/dL — ABNORMAL HIGH (ref 70–99)
Glucose-Capillary: 130 mg/dL — ABNORMAL HIGH (ref 70–99)
Glucose-Capillary: 75 mg/dL (ref 70–99)
Glucose-Capillary: 82 mg/dL (ref 70–99)
Glucose-Capillary: 89 mg/dL (ref 70–99)
Glucose-Capillary: 93 mg/dL (ref 70–99)
Glucose-Capillary: 96 mg/dL (ref 70–99)

## 2021-09-08 LAB — APTT: aPTT: 122 seconds — ABNORMAL HIGH (ref 24–36)

## 2021-09-08 LAB — HEPARIN LEVEL (UNFRACTIONATED): Heparin Unfractionated: 0.48 IU/mL (ref 0.30–0.70)

## 2021-09-08 MED ORDER — ADULT MULTIVITAMIN W/MINERALS CH
1.0000 | ORAL_TABLET | Freq: Every day | ORAL | Status: DC
  Administered 2021-09-09 – 2021-09-12 (×4): 1 via ORAL
  Filled 2021-09-08 (×4): qty 1

## 2021-09-08 MED ORDER — ENSURE ENLIVE PO LIQD
237.0000 mL | Freq: Two times a day (BID) | ORAL | Status: DC
  Administered 2021-09-09 – 2021-09-10 (×2): 237 mL via ORAL

## 2021-09-08 MED ORDER — CARVEDILOL 6.25 MG PO TABS
6.2500 mg | ORAL_TABLET | Freq: Two times a day (BID) | ORAL | Status: DC
  Administered 2021-09-08 – 2021-09-12 (×9): 6.25 mg via ORAL
  Filled 2021-09-08 (×9): qty 1

## 2021-09-08 MED ORDER — BUTALBITAL-APAP-CAFFEINE 50-325-40 MG PO TABS
1.0000 | ORAL_TABLET | Freq: Four times a day (QID) | ORAL | Status: DC | PRN
  Administered 2021-09-08 – 2021-09-11 (×5): 1 via ORAL
  Filled 2021-09-08 (×5): qty 1

## 2021-09-08 MED ORDER — PANTOPRAZOLE SODIUM 40 MG PO TBEC
40.0000 mg | DELAYED_RELEASE_TABLET | Freq: Every day | ORAL | Status: DC
  Administered 2021-09-08 – 2021-09-12 (×5): 40 mg via ORAL
  Filled 2021-09-08 (×5): qty 1

## 2021-09-08 MED ORDER — APIXABAN 5 MG PO TABS
5.0000 mg | ORAL_TABLET | Freq: Two times a day (BID) | ORAL | Status: DC
  Administered 2021-09-08 – 2021-09-12 (×9): 5 mg via ORAL
  Filled 2021-09-08 (×9): qty 1

## 2021-09-08 NOTE — TOC Initial Note (Signed)
Transition of Care (TOC) - Initial/Assessment Note  ? ? ?Patient Details  ?Name: Susan Mclean ?MRN: [id number] ?Date of Birth: 11-03-1950 ? ?Transition of Care (TOC) CM/SW Contact:    ?Benard Halsted, LCSW ?Phone Number: ?09/08/2021, 4:07 PM ? ?Clinical Narrative:                 ?CSW received consult for possible SNF placement at time of discharge. CSW spoke with patient. Patient reported that patient and her son live together. She uses a walker at home. Son is currently unable to care for patient at their home given patient?s current physical needs and fall risk as he works. Patient expressed understanding of PT recommendation and is agreeable to SNF placement at time of discharge. Patient reports preference for a place near [place] possibly. She said she has been to a SNF in [place] before. CSW discussed insurance authorization process and will provide Medicare SNF ratings list. CSW will send out referrals for review. Patient expressed being hopeful for rehab and to feel better soon. No further questions reported at this time.  ? ?Skilled Nursing Rehab Facilities-   [email or web address]   Ratings out of 5 possible   ?Name Address  Phone # Quality Care Staffing Health Inspection Overall  ?[facility] [address] [place] 4 5 2 3   ?[facility]  [address], [place] [phone] 3 2 5 5   ?[facility], [place] 3 1 1 1   ?[facility] [address] [place] 3 2 4 4   ?[facility] [address] [place] 1 1 2 1   ?[facility] [phone] 2 1 4 3   ?[place], [place] [phone] 5 2 3 4   ?[facility] [address] [place] 5 2 2 3   ?[facility] [place] [phone] 5 1 2 2   ?[facility] [address], [place] [phone]  4 1 2 1   ?[facility] [address], [place] [phone] 4 1 2 1   ?[facility] [place] [phone] 4 1 1 1   ?[facility] [address] [place] [phone] 3 2 4 4   ?        ?[place], [place]      ?[facility] [place] 4 2 3 3   ?[facility], [place] 4 1 5 4   ?[facility] [address] [place] [address], [place] [phone] 2 1 1 1   ?[facility] [address] [place] [phone] 2 1 3 2   ?        ?[address] (no [facility]) [address], [place] [phone] 4 5 5 5   ?[facility] (No [employer]) [address], [place] 3 1 4 3   ?[facility] (No [employer]) [address], [place] [phone] 5 5 5 5   ?[facility] [address] [place] [phone] 3 2 4 4   ?[facility] [address], [place] 1 1 2 1   ?[facility] [address], [place] [phone] 2 1 1 1   ?[facility] [place] 5 2 4 5   ?[facility] [address] [place] 3 1 1 1   ?[facility] 2 1 2 1   ?        ?  [facility] [address], [place] [phone] 1 1 1 1   ?[facility] [address] [place]  [phone] 2 4 2 2   ?[facility]  [address], [place] [phone] 5 2 3 4   ?[facility] [address] [place] 2 1 1 1   ?[place] (No [employer]) [address] [place] 2 1 3 2   ?[facility] [address] [place] [phone] 3 1 1 1   ?        ?[facility] [place], [place] 5 4 5 5   ?[facility])  [address], [place] 2 2 3 3   ?[facility]) [place], [place] 3 2 4 4   ?[employer] [address], [place] 4 3 4 4   ?[address] [place], [place] 3 3 1 1   ?[facility]) [address] [place] [phone] 2 2 4 4   ? ? ? ?Expected Discharge Plan: [place] ?Barriers to Discharge: Continued Medical Work up, Ship broker, SNF Pending bed offer ? ? ?Patient Goals and CMS Choice ?Patient states their goals for this hospitalization and ongoing recovery are:: Rehab ?CMS Medicare.gov Compare Post Acute Care list provided to:: Patient ?Choice offered to / list presented to : Patient ? ?Expected Discharge Plan and Services ?Expected Discharge Plan: [place] ?In-house Referral: Clinical Social Work ?  ?Post Acute Care Choice: [place] ?Living arrangements for the past 2 months: [place] ?                ?  ?  ?  ?  ?  ?  ?  ?  ?  ?  ? ?Prior Living Arrangements/Services ?Living arrangements for the past 2 months: [place] ?Lives with:: Adult Children ?Patient language and need for interpreter reviewed:: Yes ?Do you feel safe going back to the place where you live?: Yes      ?Need for Family Participation in Patient Care: Yes (Comment) ?Care giver support system in place?: Yes (comment) ?  ?Criminal Activity/Legal Involvement Pertinent to Current Situation/Hospitalization: No - Comment as needed ? ?Activities of Daily Living ?  ?  ? ?Permission Sought/Granted ?Permission sought to share information with : Facility Sport and exercise psychologist, Family Supports ?Permission granted to share information with : Yes, Verbal Permission Granted ?   ? Permission granted to share info w AGENCY: SNFs ?   ?   ? ?Emotional Assessment ?Appearance:: Appears stated age ?Attitude/Demeanor/Rapport: Engaged ?Affect (typically observed): Accepting, Appropriate (Slow speech) ?Orientation: : Oriented to Self, Oriented to Place, Oriented to  Time, Oriented to Situation ?Alcohol / Substance  Use: Not Applicable ?Psych Involvement: No (comment) ? ?Admission diagnosis:  Seizure ([place]) [R56.9] ?Respiratory failure, unspecified chronicity, unspecified whether with hypoxia or hypercapnia ([place]) [J96.90] ?Patient Active Problem List  ? Diagnosis Date Noted  ? Respiratory failure ([place])   ? Seizure ([place]) 09/03/2021  ? Acute kidney injury superimposed on CKD ([place])   ? Non-ST elevation (NSTEMI) myocardial infarction ([place]) 03/09/2021  ? DM type 2 (diabetes mellitus, type 2) ([place]) 03/09/2021  ? CAD (coronary artery disease) 03/09/2021  ? Atrial fibrillation ([place]) 03/09/2021  ? Chronic anticoagulation 03/09/2021  ? Obesity, Class III, BMI 40-49.9 (morbid obesity) ([place]) 03/09/2021  ? HTN (hypertension) 03/09/2021  ? ?PCP:  Pcp, No ?Pharmacy:   ?[id number] [place] [place] - [address] ?[address] ?[place] [address] ?Phone: [phone] Fax: [phone] ? ? ? ? ?Social Determinants of Health (SDOH) Interventions ?  ? ?Readmission Risk Interventions ?   ? View : No data to display.  ?  ?  ?  ? ? ? ?

## 2021-09-08 NOTE — Care Management Important Message (Signed)
Important Message ? ?Patient Details  ?Name: Susan Mclean ?MRN: [id number] ?Date of Birth: 1951/04/11 ? ? ?Medicare Important Message Given:  Yes ? ? ? ? ?Susan Mclean ?09/08/2021, 3:54 PM ?

## 2021-09-08 NOTE — Progress Notes (Signed)
?PROGRESS NOTE ? ? ? ?Chris Narasimhan  DDU:[id number] DOB: [birth year] DOA: 09/03/2021 ?PCP: Pcp, No  ? ? ?No chief complaint on file. ? ? ?Brief Narrative:  ? ?71 yo female with pmh of afib on eliquis, h/o dvt, HFpEF, htn, dm2, cad, ntesmi, ckd who presented to osh unresponsive and with L sided eye deviation, L weakness and nystagmus. All history obtained from chart as pt is intubated and unresponsive.  ?  ?She was reportedly last known normal at 2300. Presented to [place] [facility]. GCS 3 and was emergently intubated. She had spontaneous movement of R and no movement on L. Her L gaze deviation resolved. She was started on propofol and loaded with Keppra. CTH negative for acute cva but showed chronic b/l PCA strokes R >L.  Patient was transferred from [facility] to [facility] for long-term EEG.   ? ? ?Assessment & Plan: ?  ?Principal Problem: ?  Seizure ([place]) ?Active Problems: ?  Respiratory failure ([place]) ? ? ?Acute encephalopathy  ?Seizure like activity ?- Repeat CT head was stable ?- EEG with no further seizures ?- MRI with chronic bilateral occipital infarcts ?-Appreciate Neuro input, continue Keppra 500mg  bid, will change from IV to p.o. as she is able to take oral intake. ?-PT/OT ?  ? Acute hypoxic resp failure in the setting of seizure activity ?-extubated to [place], wean as able ?-She was encouraged to use incentive spirometer. ?  ? Atrial Fibrillation  ?-Initially on heparin GTT, now passed swallow evaluation changed to Eliquis. ?- will DC Amiodarone  on drip, now she can tolerate oral, will resume home dose Coreg. ?  ?Thrombocytopenia ?-stable and improving, likely secondary to acute illness ?  ?HFpEF, HTN, HL ?-no acute exacerbation ?-continue statin ?-resume Plavix couple days when she is more stable. ?  ?T2dm:  ?-continue SSI ?-a1c in 11/22 =6.2 ?  ?H/o CVA:  ?-resume Plavix ?  ?CKD III-IV ?Creatinine near baseline  ?-stable  ?  ?Metabolic acidosis: ? improving ?  ?  ? ? ?DVT prophylaxis: Heparin GTT>> Eliquis ?Code Status:  Full ?Family Communication: None at bedside ?Disposition:  ? ?Status is: Inpatient ? ? ?Consultants:  ?PCCM ?Neurology ? ? ?Subjective: ? ?No significant events overnight as discussed with staff, she denies any complaints. ? ?Objective: ?Vitals:  ? 09/07/21 2352 09/08/21 0400 09/08/21 0500 09/08/21 0757  ?BP: 127/64 123/60  (!) 130/56  ?Pulse: 62 64  67  ?Resp: 18 (!) 21  17  ?Temp: (!) 97.4 ?F (36.3 ?C) 97.9 ?F (36.6 ?C)  98.3 ?F (36.8 ?C)  ?TempSrc: Oral Oral  Oral  ?SpO2: 94%   95%  ?Weight:   90.5 kg   ?Height:      ? ? ?Intake/Output Summary (Last 24 hours) at 09/08/2021 1147 ?Last data filed at 09/08/2021 0049 ?Gross per 24 hour  ?Intake 182.99 ml  ?Output 700 ml  ?Net -517.01 ml  ? ?Filed Weights  ? 09/06/21 0442 09/07/21 0456 09/08/21 0500  ?Weight: 88.8 kg 89 kg 90.5 kg  ? ? ?Examination: ? ?Awake Alert, Oriented X 3, deconditioned.  Frail ?Symmetrical Chest wall movement, Good air movement bilaterally, CTAB ?RRR,No Gallops,Rubs or new Murmurs, No Parasternal Heave ?+ve B.Sounds, Abd Soft, No tenderness, No rebound - guarding or rigidity. ?No Cyanosis, Clubbing or edema, No new Rash or bruise   ? ? ? ? ?Data Reviewed: I have personally reviewed following labs and imaging studies ? ?CBC: ?Recent Labs  ?Lab 09/02/21 ?2351 09/03/21 ?0650 09/04/21 ?2376 09/05/21 ?0615 09/06/21 ?0319 09/07/21 ?2831 09/08/21 ?  0248  ?WBC 10.2   < > 8.4 10.3 8.4 7.4 5.6  ?NEUTROABS 3.4  --   --   --   --   --   --   ?HGB 11.9*   < > 10.2* 12.5 10.5* 9.5* 9.1*  ?HCT 38.9   < > 30.4* 38.6 33.1* 29.6* 28.6*  ?MCV 90.7   < > 86.6 86.9 88.0 88.1 87.2  ?PLT 145*   < > 102* 79* 98* 100* 105*  ? < > = values in this interval not displayed.  ? ? ?Basic Metabolic Panel: ?Recent Labs  ?Lab 09/03/21 ?9381 09/03/21 ?2026 09/04/21 ?0175 09/05/21 ?0615 09/06/21 ?0319 09/07/21 ?1025 09/08/21 ?0248  ?NA 140   < > 144 144 143 144 144  ?K 5.2*   < > 4.6 4.9 4.8 5.1 4.8  ?CL 120*  --  121* 118* 118* 118* 118*  ?CO2 14*  --  19* 17* 18* 19* 20*  ?GLUCOSE  115*  --  155* 154* 138* 94 93  ?BUN 27*  --  24* 28* 35* 34* 29*  ?CREATININE 2.01*  --  1.98* 1.98* 2.12* 2.05* 1.87*  ?CALCIUM 8.7*  --  8.8* 8.4* 8.6* 8.8* 9.0  ?MG 1.8  --  1.8  --   --  1.8  --   ?PHOS 3.4  --   --   --   --  4.5  --   ? < > = values in this interval not displayed.  ? ? ?GFR: ?Estimated Creatinine Clearance: 28.7 mL/min (A) (by C-G formula based on SCr of 1.87 mg/dL (H)). ? ?Liver Function Tests: ?Recent Labs  ?Lab 09/02/21 ?2351  ?AST 29  ?ALT 12  ?ALKPHOS 187*  ?BILITOT 0.6  ?PROT 8.2*  ?ALBUMIN 3.7  ? ? ?CBG: ?Recent Labs  ?Lab 09/07/21 ?1629 09/07/21 ?2029 09/08/21 ?8527 09/08/21 ?7824 09/08/21 ?0756  ?GLUCAP 96 110* 89 75 93  ? ? ? ?Recent Results (from the past 240 hour(s))  ?Resp Panel by RT-PCR (Flu A&B, Covid) Nasopharyngeal Swab     Status: None  ? Collection Time: 09/03/21 12:33 AM  ? Specimen: Nasopharyngeal Swab; Nasopharyngeal(NP) swabs in vial transport medium  ?Result Value Ref Range Status  ? SARS Coronavirus 2 by RT PCR NEGATIVE NEGATIVE Final  ?  Comment: (NOTE) ?SARS-CoV-2 target nucleic acids are NOT DETECTED. ? ?The SARS-CoV-2 RNA is generally detectable in upper respiratory ?specimens during the acute phase of infection. The lowest ?concentration of SARS-CoV-2 viral copies this assay can detect is ?138 copies/mL. A negative result does not preclude SARS-Cov-2 ?infection and should not be used as the sole basis for treatment or ?other patient management decisions. A negative result may occur with  ?improper specimen collection/handling, submission of specimen other ?than nasopharyngeal swab, presence of viral mutation(s) within the ?areas targeted by this assay, and inadequate number of viral ?copies(<138 copies/mL). A negative result must be combined with ?clinical observations, patient history, and epidemiological ?information. The expected result is Negative. ? ?Fact Sheet for Patients:  ?EntrepreneurPulse.com.au ? ?Fact Sheet for Healthcare Providers:   ?IncredibleEmployment.be ? ?This test is no t yet approved or cleared by the [place] FDA and  ?has been authorized for detection and/or diagnosis of SARS-CoV-2 by ?FDA under an Emergency Use Authorization (EUA). This EUA will remain  ?in effect (meaning this test can be used) for the duration of the ?COVID-19 declaration under Section 564(b)(1) of the Act, 21 ?U.S.C.section 360bbb-3(b)(1), unless the authorization is terminated  ?or revoked sooner.  ? ? ?  ?  Influenza A by PCR NEGATIVE NEGATIVE Final  ? Influenza B by PCR NEGATIVE NEGATIVE Final  ?  Comment: (NOTE) ?The Xpert Xpress SARS-CoV-2/FLU/RSV plus assay is intended as an aid ?in the diagnosis of influenza from Nasopharyngeal swab specimens and ?should not be used as a sole basis for treatment. Nasal washings and ?aspirates are unacceptable for Xpert Xpress SARS-CoV-2/FLU/RSV ?testing. ? ?Fact Sheet for Patients: ?EntrepreneurPulse.com.au ? ?Fact Sheet for Healthcare Providers: ?IncredibleEmployment.be ? ?This test is not yet approved or cleared by the [place] FDA and ?has been authorized for detection and/or diagnosis of SARS-CoV-2 by ?FDA under an Emergency Use Authorization (EUA). This EUA will remain ?in effect (meaning this test can be used) for the duration of the ?COVID-19 declaration under Section 564(b)(1) of the Act, 21 U.S.C. ?section 360bbb-3(b)(1), unless the authorization is terminated or ?revoked. ? ?Performed at [facility], [place], ?[place] [address] ?  ?Culture, blood (Routine X 2) w Reflex to ID Panel     Status: None  ? Collection Time: 09/03/21  5:34 AM  ? Specimen: BLOOD  ?Result Value Ref Range Status  ? Specimen Description BLOOD SITE NOT SPECIFIED  Final  ? Special Requests AEROBIC BOTTLE ONLY Blood Culture adequate volume  Final  ? Culture   Final  ?  NO GROWTH 5 DAYS ?Performed at [facility], [place] [address], [place], [place]  [address] ?  ? Report Status 09/08/2021 FINAL  Final  ?Culture, blood (Routine X 2) w Reflex to ID Panel     Status: None  ? Collection Time: 09/03/21  6:50 AM  ? Specimen: BLOOD  ?Result Value Ref Range Stat

## 2021-09-08 NOTE — Progress Notes (Signed)
Nutrition Follow-up ? ?DOCUMENTATION CODES:  ? ?Not applicable ? ?INTERVENTION:  ? ?Discontinue TF regimen due to no access ?Multivitamin w/ minerals daily ?Ensure Enlive po BID, each supplement provides 350 kcal and 20 grams of protein. ?Meal ordering with assist ? ?NUTRITION DIAGNOSIS:  ? ?Inadequate oral intake related to inability to eat as evidenced by NPO status. - Progressing, diet advanced ? ?GOAL:  ? ?Patient will meet greater than or equal to 90% of their needs - Ongoing ? ?MONITOR:  ? ?PO intake, Supplement acceptance, Labs ? ?REASON FOR ASSESSMENT:  ? ?Ventilator, Consult ?Enteral/tube feeding initiation and management ? ?ASSESSMENT:  ? ?71 yo female admitted to [facility] with unresponsiveness, L side eye deviation, L weakness, and nystagmus. Intubated at [facility]. Transferred to [facility] for Neurology care with seizure like activity and respiratory failure. PMH includes A fib, HF, HTN, DM-2, CAD, CKD. ? ?5/02 - extubated  ?5/03 - diet advanced to Dysphagia 3, Nectar Thick Liquids; transferred to the floor ?5/04 - diet advanced to regular, thin liquids ? ?Pt reports that her appetite was good at home. Stated that she was hungry now. Pt endorses a bit of a sore throat since being intubated. Pt is glad that diet was advanced and no longer on thickened liquids.  ? ?Pt reports that the last time she weighed she was ~198#, but does not think that she has lost any weight recently. Per EMR, pt has not had any weight loss.  ? ?Discussed using an ONS until pt sore throat resolves and can eat better. Pt agreeable to ONS.  ? ?Medications reviewed and include: Colace, SSI 0-15 units q4h, Miralax ?Labs reviewed: BUN 29, Creatinine 1.87, 24 hr CBG 75-117 ? ?Diet Order:   ?Diet Order   ? ?       ?  Diet regular Room service appropriate? Yes with Assist; Fluid consistency: Thin  Diet effective now       ?  ? ?  ?  ? ?  ? ?EDUCATION NEEDS:  ? ?No education needs have been identified at this time ? ?Skin:  Skin Assessment: Reviewed RN  Assessment ? ?Last BM:  5/3 - Type 6 ? ?Height:  ?Ht Readings from Last 1 Encounters:  ?09/03/21 5\' 1"  (1.549 m)  ? ?Weight:  ?Wt Readings from Last 1 Encounters:  ?09/08/21 90.5 kg  ? ?BMI:  Body mass index is 37.7 kg/m?. ? ?Estimated Nutritional Needs:  ? ?Kcal:  2000-2200 ? ?Protein:  100-115 grams ? ?Fluid:  >/= 2 L ? ? ?Hermina Barters RD, LDN ?Clinical Dietitian ?See AMiON for contact information.  ?                                                ? ?

## 2021-09-08 NOTE — Progress Notes (Signed)
ANTICOAGULATION CONSULT NOTE  ? ?Pharmacy Consult for Heparin  ?Indication: transition from heparin infusion to apixaban ? ?No Known Allergies ? ?Patient Measurements: ?Height: 5\' 1"  (154.9 cm) ?Weight: 90.5 kg (199 lb 8.3 oz) ?IBW/kg (Calculated) : 47.8 ? ? ?Vital Signs: ?Temp: 98.3 ?F (36.8 ?C) (05/04 0757) ?Temp Source: Oral (05/04 0757) ?BP: 130/56 (05/04 0757) ?Pulse Rate: 67 (05/04 0757) ? ?Labs: ?Recent Labs  ?  09/06/21 ?0319 09/06/21 ?1355 09/07/21 ?9233 09/08/21 ?0248  ?HGB 10.5*  --  9.5* 9.1*  ?HCT 33.1*  --  29.6* 28.6*  ?PLT 98*  --  100* 105*  ?APTT 65* 66* 51* 122*  ?HEPARINUNFRC 0.54  --  0.36 0.48  ?CREATININE 2.12*  --  2.05* 1.87*  ? ? ? ?Estimated Creatinine Clearance: 28.7 mL/min (A) (by C-G formula based on SCr of 1.87 mg/dL (H)). ? ? ?Medical History: ?Past Medical History:  ?Diagnosis Date  ? Atrial fibrillation ([place])   ? CHF (congestive heart failure) ([place])   ? DVT (deep venous thrombosis) ([place])   ? Left lower extremity  ? HTN (hypertension)   ? Hypertension   ? MI (myocardial infarction) ([place])   ? ? ? ? ?Assessment: ?70yof with Hx DVT and afib on apixaban pta admitted with mental status change and intubated for airway protection. Head CT negative for bleed  ?Pharmacy asked to dose heparin while off oral anticoagulation.   ? ?aPTT is below goal, still not correlating with heparin level, CBC is therapeutic.MRI with no acute changes. ? ?5/4- Consult received to transition back to apixaban ? ?Goal of Therapy:  ?Monitor platelets by anticoagulation protocol: Yes ?  ?Plan:  ?Discontinue heparin infusion ?Start apixaban 5 mg po bid ?Monitor CBC, bmet routine ?Pharmacy will sign off on consult but continue to monitor in the background and make recommendations PRN ? ? ?Lanyia Jewel BS, PharmD, BCPS ?Clinical Pharmacist ?09/08/2021 10:51 AM ? ?Contact: [phone] after 3 PM ? ?"Be curious, not judgmental..." -Jamal Maes ? ? ?

## 2021-09-08 NOTE — Progress Notes (Signed)
Speech Language Pathology Treatment: Dysphagia  ?Patient Details ?Name: Susan Mclean ?MRN: [id number] ?DOB: 11-24-1950 ?Today's Date: 09/08/2021 ?Time: 7902-4097 ?SLP Time Calculation (min) (ACUTE ONLY): 11 min ? ?Assessment / Plan / Recommendation ?Clinical Impression ? Pt's swallow function has improved from yesterday given more time off ventilator. She cleared throat once immediately after first sip water via cup and no other signs with cup or straw sips thin. Mastication with solid was timely without residue. Pt alert, pleasant with stable respirations. Pt would like to upgrade to regular and SLP agrees. Upgrade to regular/thin liquids, pills with thin, straws allowed with small sips. ST will sign off at this time.  ?  ?HPI HPI: Pt is a 71 y.o. who presents 09/03/2021 with seizure like activity; pt unresponsive with L eye deviation, L weakness and nystagmus.  CTH negative for acute cva but showed chronic b/l PCA strokes R >L. MRI negative for acute infarct. Intubated 4/29-5/2. ?  ?   ?SLP Plan ? All goals met;Discharge SLP treatment due to (comment) ? ?  ?  ?Recommendations for follow up therapy are one component of a multi-disciplinary discharge planning process, led by the attending physician.  Recommendations may be updated based on patient status, additional functional criteria and insurance authorization. ?  ? ?Recommendations  ?Diet recommendations: Regular;Thin liquid ?Liquids provided via: Cup;Straw ?Medication Administration: Whole meds with puree ?Supervision: Patient able to self feed ?Compensations: Small sips/bites;Slow rate ?Postural Changes and/or Swallow Maneuvers: Seated upright 90 degrees  ?   ? PMSV Supervision: Intermittent  ?   ? ? ? ? Oral Care Recommendations: Oral care BID ?Follow Up Recommendations: No SLP follow up ?Assistance recommended at discharge: None ?SLP Visit Diagnosis: Dysphagia, unspecified (R13.10) ?Plan: All goals met;Discharge SLP treatment due to (comment) ? ? ? ? ?  ?   ? ? ?Houston Siren ? ?09/08/2021, 11:04 AM ?

## 2021-09-08 NOTE — [place] FL2 (Signed)
?[place] MEDICAID FL2 LEVEL OF CARE SCREENING TOOL  ?  ? ?IDENTIFICATION  ?Patient Name: ?Susan Mclean Birthdate: 09/19/1950 Sex: female Admission Date (Current Location): ?09/03/2021  ?[place] and [place] Number: ? [place] ?  Facility and Address:  ?[facility] [place] [address] [place], [place] [address] ?     Provider Number: ?[id number]  ?Attending Physician Name and Address:  ?Elgergawy, Silver Huguenin, MD ? Relative Name and Phone Number:  ?  ?   ?Current Level of Care: ?Hospital Recommended Level of Care: ?[place] Prior Approval Number: ?  ? ?Date Approved/Denied: ?  PASRR Number: ?[id number] A ? ?Discharge Plan: ?SNF ?  ? ?Current Diagnoses: ?Patient Active Problem List  ? Diagnosis Date Noted  ? Respiratory failure ([place])   ? Seizure ([place]) 09/03/2021  ? Acute kidney injury superimposed on CKD ([place])   ? Non-ST elevation (NSTEMI) myocardial infarction ([place]) 03/09/2021  ? DM type 2 (diabetes mellitus, type 2) ([place]) 03/09/2021  ? CAD (coronary artery disease) 03/09/2021  ? Atrial fibrillation ([place]) 03/09/2021  ? Chronic anticoagulation 03/09/2021  ? Obesity, Class III, BMI 40-49.9 (morbid obesity) ([place]) 03/09/2021  ? HTN (hypertension) 03/09/2021  ? ? ?Orientation RESPIRATION BLADDER Height & Weight   ?  ?Self, Time, Situation, Place ? O2 (3L nasal cannula) Continent, Indwelling catheter Weight: 199 lb 8.3 oz (90.5 kg) ?Height:  '5\' 1"'  (154.9 cm)  ?BEHAVIORAL SYMPTOMS/MOOD NEUROLOGICAL BOWEL NUTRITION STATUS  ?    Incontinent Diet (See dc summary)  ?AMBULATORY STATUS COMMUNICATION OF NEEDS Skin   ?Extensive Assist Verbally Normal ?  ?  ?  ?    ?     ?     ? ? ?Personal Care Assistance Level of Assistance  ?Bathing, Feeding, Dressing Bathing Assistance: Limited assistance ?Feeding assistance: Independent ?Dressing Assistance: Limited assistance ?   ? ?Functional Limitations Info  ?    ?  ?   ? ? ?SPECIAL CARE FACTORS FREQUENCY  ?PT (By licensed PT), OT (By licensed OT)   ?  ?PT  Frequency: 5x/week ?OT Frequency: 5x/week ?  ?  ?  ?   ? ? ?Contractures Contractures Info: Not present  ? ? ?Additional Factors Info  ?Code Status, Allergies, Insulin Sliding Scale Code Status Info: Full ?Allergies Info: NKA ?  ?Insulin Sliding Scale Info: See dc summary ?  ?   ? ?Current Medications (09/08/2021):  This is the current hospital active medication list ?Current Facility-Administered Medications  ?Medication Dose Route Frequency Provider Last Rate Last Admin  ? apixaban (ELIQUIS) tablet 5 mg  5 mg Oral BID Reome, Earle J, RPH   5 mg at 09/08/21 1212  ? atorvastatin (LIPITOR) tablet 80 mg  80 mg Oral Daily Einar Grad, RPH   80 mg at 09/08/21 2841  ? bethanechol (URECHOLINE) tablet 10 mg  10 mg Oral TID Einar Grad, RPH   10 mg at 09/08/21 3244  ? butalbital-acetaminophen-caffeine (FIORICET) 50-325-40 MG per tablet 1 tablet  1 tablet Oral Q6H PRN Elgergawy, Silver Huguenin, MD   1 tablet at 09/08/21 1244  ? carvedilol (COREG) tablet 6.25 mg  6.25 mg Oral BID WC Elgergawy, Silver Huguenin, MD      ? chlorhexidine gluconate (MEDLINE KIT) (PERIDEX) 0.12 % solution 15 mL  15 mL Mouth Rinse BID Margaretha Seeds, MD   15 mL at 09/07/21 2100  ? Chlorhexidine Gluconate Cloth 2 % PADS 6 each  6 each Topical Q0600 Margaretha Seeds, MD  6 each at 09/08/21 0500  ? clopidogrel (PLAVIX) tablet 75 mg  75 mg Oral Daily Einar Grad, RPH   75 mg at 09/08/21 7867  ? docusate sodium (COLACE) capsule 100 mg  100 mg Oral BID Einar Grad, RPH   100 mg at 09/08/21 5449  ? feeding supplement (ENSURE ENLIVE / ENSURE PLUS) liquid 237 mL  237 mL Oral BID BM Elgergawy, Silver Huguenin, MD      ? hydrALAZINE (APRESOLINE) injection 10 mg  10 mg Intravenous Q6H PRN Margaretha Seeds, MD   10 mg at 09/04/21 1536  ? insulin aspart (novoLOG) injection 0-15 Units  0-15 Units Subcutaneous Q4H Audria Nine, DO   2 Units at 09/06/21 1145  ? levETIRAcetam (KEPPRA) tablet 500 mg  500 mg Oral BID Donnetta Simpers, MD   500 mg at  09/08/21 2010  ? multivitamin with minerals tablet 1 tablet  1 tablet Oral Daily Elgergawy, Silver Huguenin, MD      ? pantoprazole (PROTONIX) EC tablet 40 mg  40 mg Oral Daily Elgergawy, Silver Huguenin, MD   40 mg at 09/08/21 1212  ? polyethylene glycol (MIRALAX / GLYCOLAX) packet 17 g  17 g Oral Daily Einar Grad, RPH      ? ? ? ?Discharge Medications: ?Please see discharge summary for a list of discharge medications. ? ?Relevant Imaging Results: ? ?Relevant Lab Results: ? ? ?Additional Information ?SSN: [id number]. ? ?Benard Halsted, LCSW ? ? ? ? ?

## 2021-09-09 LAB — BASIC METABOLIC PANEL
Anion gap: 3 — ABNORMAL LOW (ref 5–15)
BUN: 28 mg/dL — ABNORMAL HIGH (ref 8–23)
CO2: 20 mmol/L — ABNORMAL LOW (ref 22–32)
Calcium: 8.8 mg/dL — ABNORMAL LOW (ref 8.9–10.3)
Chloride: 119 mmol/L — ABNORMAL HIGH (ref 98–111)
Creatinine, Ser: 2.06 mg/dL — ABNORMAL HIGH (ref 0.44–1.00)
GFR, Estimated: 25 mL/min — ABNORMAL LOW (ref >60)
Glucose, Bld: 100 mg/dL — ABNORMAL HIGH (ref 70–99)
Potassium: 4.4 mmol/L (ref 3.5–5.1)
Sodium: 142 mmol/L (ref 135–145)

## 2021-09-09 LAB — GLUCOSE, CAPILLARY
Glucose-Capillary: 85 mg/dL (ref 70–99)
Glucose-Capillary: 79 mg/dL (ref 70–99)
Glucose-Capillary: 142 mg/dL — ABNORMAL HIGH (ref 70–99)
Glucose-Capillary: 71 mg/dL (ref 70–99)
Glucose-Capillary: 98 mg/dL (ref 70–99)
Glucose-Capillary: 147 mg/dL — ABNORMAL HIGH (ref 70–99)
Glucose-Capillary: 90 mg/dL (ref 70–99)

## 2021-09-09 LAB — CBC
HCT: 27.9 % — ABNORMAL LOW (ref 36.0–46.0)
Hemoglobin: 8.9 g/dL — ABNORMAL LOW (ref 12.0–15.0)
MCH: 27.8 pg (ref 26.0–34.0)
MCHC: 31.9 g/dL (ref 30.0–36.0)
MCV: 87.2 fL (ref 80.0–100.0)
Platelets: 114 10*3/uL — ABNORMAL LOW (ref 150–400)
RBC: 3.2 MIL/uL — ABNORMAL LOW (ref 3.87–5.11)
RDW: 14.5 % (ref 11.5–15.5)
WBC: 4.5 10*3/uL (ref 4.0–10.5)
nRBC: 0 % (ref 0.0–0.2)

## 2021-09-09 NOTE — Progress Notes (Signed)
?PROGRESS NOTE ? ? ? ?Bryan Omura  DTO:[id number] DOB: [birth year] DOA: 09/03/2021 ?PCP: Pcp, No  ? ? ?No chief complaint on file. ? ? ?Brief Narrative:  ? ?71 yo female with pmh of afib on eliquis, h/o dvt, HFpEF, htn, dm2, cad, ntesmi, ckd who presented to osh unresponsive and with L sided eye deviation, L weakness and nystagmus. All history obtained from chart as pt is intubated and unresponsive.  ?  ?She was reportedly last known normal at 2300. Presented to [facility]. GCS 3 and was emergently intubated. She had spontaneous movement of R and no movement on L. Her L gaze deviation resolved. She was started on propofol and loaded with Keppra. CTH negative for acute cva but showed chronic b/l PCA strokes R >L.  Patient was transferred from [facility] to [facility] for long-term EEG.   ? ? ?Assessment & Plan: ?  ?Principal Problem: ?  Seizure ([place]) ?Active Problems: ?  Respiratory failure ([place]) ? ? ?Acute encephalopathy  ?Seizure like activity ?- Repeat CT head was stable ?- EEG with no further seizures ?- MRI with chronic bilateral occipital infarcts ?-Appreciate Neuro input, continue Keppra 500mg  bid,  ?-I have discussed with patient's in detail seizures precautions. ?-PT/OT recommending SNF. ?  ? Acute hypoxic resp failure in the setting of seizure activity ?-extubated to [place], wean as able ?-She was encouraged to use incentive spirometer. ?-Wean oxygen as tolerated ?  ? Atrial Fibrillation  ?-Initially on heparin GTT, now passed swallow evaluation changed to Eliquis. ?-Initially insulin drip which has been discontinued, heart rate is acceptable on home dose Coreg ?  ?Thrombocytopenia ?-stable and improving, likely secondary to acute illness ?  ?HFpEF, HTN, HL ?-no acute exacerbation ?-continue statin ?-resume Plavix couple days when she is more stable. ?  ?T2dm:  ?-continue SSI ?-a1c in 11/22 =6.2 ?  ?H/o CVA:  ?-resume Plavix ?  ?CKD III-IV ?Creatinine near baseline  ?-stable  ?  ?Metabolic acidosis: ? improving ?  ?   ? ? ?DVT prophylaxis: Heparin GTT>> Eliquis ?Code Status: Full ?Family Communication: None at bedside ?Disposition:  ? ?Status is: Inpatient ? ? ?Consultants:  ?PCCM ?Neurology ? ? ?Subjective: ? ?No significant events overnight as discussed with staff, she denies any complaints. ? ?Objective: ?Vitals:  ? 09/09/21 0500 09/09/21 0825 09/09/21 1229 09/09/21 1500  ?BP:  (!) 142/6 (!) 112/56 128/69  ?Pulse:  64 64 65  ?Resp:  18 15 16   ?Temp:  98.3 ?F (36.8 ?C) 98.5 ?F (36.9 ?C) 98.7 ?F (37.1 ?C)  ?TempSrc:  Oral Oral Oral  ?SpO2:  96% 95% 96%  ?Weight: 88.9 kg     ?Height:      ? ? ?Intake/Output Summary (Last 24 hours) at 09/09/2021 1646 ?Last data filed at 09/09/2021 0540 ?Gross per 24 hour  ?Intake --  ?Output 2050 ml  ?Net -2050 ml  ? ?Filed Weights  ? 09/07/21 0456 09/08/21 0500 09/09/21 0500  ?Weight: 89 kg 90.5 kg 88.9 kg  ? ? ?Examination: ? ?Awake Alert, Oriented X 3, No new F.N deficits, Normal affect, frail. ?Symmetrical Chest wall movement, Good air movement bilaterally, CTAB ?RRR,No Gallops,Rubs or new Murmurs, No Parasternal Heave ?+ve B.Sounds, Abd Soft, No tenderness, No rebound - guarding or rigidity. ?No Cyanosis, Clubbing or edema, No new Rash or bruise   ? ? ? ? ? ?Data Reviewed: I have personally reviewed following labs and imaging studies ? ?CBC: ?Recent Labs  ?Lab 09/02/21 ?2351 09/03/21 ?0650 09/05/21 ?0615 09/06/21 ?0319 09/07/21 ?9833  09/08/21 ?2836 09/09/21 ?0120  ?WBC 10.2   < > 10.3 8.4 7.4 5.6 4.5  ?NEUTROABS 3.4  --   --   --   --   --   --   ?HGB 11.9*   < > 12.5 10.5* 9.5* 9.1* 8.9*  ?HCT 38.9   < > 38.6 33.1* 29.6* 28.6* 27.9*  ?MCV 90.7   < > 86.9 88.0 88.1 87.2 87.2  ?PLT 145*   < > 79* 98* 100* 105* 114*  ? < > = values in this interval not displayed.  ? ? ?Basic Metabolic Panel: ?Recent Labs  ?Lab 09/03/21 ?6294 09/03/21 ?2026 09/04/21 ?7654 09/05/21 ?0615 09/06/21 ?0319 09/07/21 ?6503 09/08/21 ?5465 09/09/21 ?0120  ?NA 140   < > 144 144 143 144 144 142  ?K 5.2*   < > 4.6 4.9 4.8 5.1  4.8 4.4  ?CL 120*  --  121* 118* 118* 118* 118* 119*  ?CO2 14*  --  19* 17* 18* 19* 20* 20*  ?GLUCOSE 115*  --  155* 154* 138* 94 93 100*  ?BUN 27*  --  24* 28* 35* 34* 29* 28*  ?CREATININE 2.01*  --  1.98* 1.98* 2.12* 2.05* 1.87* 2.06*  ?CALCIUM 8.7*  --  8.8* 8.4* 8.6* 8.8* 9.0 8.8*  ?MG 1.8  --  1.8  --   --  1.8  --   --   ?PHOS 3.4  --   --   --   --  4.5  --   --   ? < > = values in this interval not displayed.  ? ? ?GFR: ?Estimated Creatinine Clearance: 25.8 mL/min (A) (by C-G formula based on SCr of 2.06 mg/dL (H)). ? ?Liver Function Tests: ?Recent Labs  ?Lab 09/02/21 ?2351  ?AST 29  ?ALT 12  ?ALKPHOS 187*  ?BILITOT 0.6  ?PROT 8.2*  ?ALBUMIN 3.7  ? ? ?CBG: ?Recent Labs  ?Lab 09/08/21 ?2332 09/09/21 ?0322 09/09/21 ?0827 09/09/21 ?1231 09/09/21 ?1613  ?GLUCAP 130* 85 79 142* 71  ? ? ? ?Recent Results (from the past 240 hour(s))  ?Resp Panel by RT-PCR (Flu A&B, Covid) Nasopharyngeal Swab     Status: None  ? Collection Time: 09/03/21 12:33 AM  ? Specimen: Nasopharyngeal Swab; Nasopharyngeal(NP) swabs in vial transport medium  ?Result Value Ref Range Status  ? SARS Coronavirus 2 by RT PCR NEGATIVE NEGATIVE Final  ?  Comment: (NOTE) ?SARS-CoV-2 target nucleic acids are NOT DETECTED. ? ?The SARS-CoV-2 RNA is generally detectable in upper respiratory ?specimens during the acute phase of infection. The lowest ?concentration of SARS-CoV-2 viral copies this assay can detect is ?138 copies/mL. A negative result does not preclude SARS-Cov-2 ?infection and should not be used as the sole basis for treatment or ?other patient management decisions. A negative result may occur with  ?improper specimen collection/handling, submission of specimen other ?than nasopharyngeal swab, presence of viral mutation(s) within the ?areas targeted by this assay, and inadequate number of viral ?copies(<138 copies/mL). A negative result must be combined with ?clinical observations, patient history, and epidemiological ?information. The  expected result is Negative. ? ?Fact Sheet for Patients:  ?EntrepreneurPulse.com.au ? ?Fact Sheet for Healthcare Providers:  ?IncredibleEmployment.be ? ?This test is no t yet approved or cleared by the [place] FDA and  ?has been authorized for detection and/or diagnosis of SARS-CoV-2 by ?FDA under an Emergency Use Authorization (EUA). This EUA will remain  ?in effect (meaning this test can be used) for the duration of the ?COVID-19 declaration  under Section 564(b)(1) of the Act, 21 ?U.S.C.section 360bbb-3(b)(1), unless the authorization is terminated  ?or revoked sooner.  ? ? ?  ? Influenza A by PCR NEGATIVE NEGATIVE Final  ? Influenza B by PCR NEGATIVE NEGATIVE Final  ?  Comment: (NOTE) ?The Xpert Xpress SARS-CoV-2/FLU/RSV plus assay is intended as an aid ?in the diagnosis of influenza from Nasopharyngeal swab specimens and ?should not be used as a sole basis for treatment. Nasal washings and ?aspirates are unacceptable for Xpert Xpress SARS-CoV-2/FLU/RSV ?testing. ? ?Fact Sheet for Patients: ?EntrepreneurPulse.com.au ? ?Fact Sheet for Healthcare Providers: ?IncredibleEmployment.be ? ?This test is not yet approved or cleared by the [place] FDA and ?has been authorized for detection and/or diagnosis of SARS-CoV-2 by ?FDA under an Emergency Use Authorization (EUA). This EUA will remain ?in effect (meaning this test can be used) for the duration of the ?COVID-19 declaration under Section 564(b)(1) of the Act, 21 U.S.C. ?section 360bbb-3(b)(1), unless the authorization is terminated or ?revoked. ? ?Performed at [facility], [place], ?[place] [address] ?  ?Culture, blood (Routine X 2) w Reflex to ID Panel     Status: None  ? Collection Time: 09/03/21  5:34 AM  ? Specimen: BLOOD  ?Result Value Ref Range Status  ? Specimen Description BLOOD SITE NOT SPECIFIED  Final  ? Special Requests AEROBIC BOTTLE ONLY Blood  Culture adequate volume  Final  ? Culture   Final  ?  NO GROWTH 5 DAYS ?Performed at [facility], [place] [address] [place], [place] [address] ?  ? Report Status 09/08/2021 FINAL  Final  ?Culture, blood ([place]

## 2021-09-09 NOTE — TOC Progression Note (Addendum)
Transition of Care (TOC) - Progression Note  ? ? ?Patient Details  ?Name: Legacie Dillingham ?MRN: [id number] ?Date of Birth: 10/04/1950 ? ?Transition of Care (TOC) CM/SW Contact  ?Benard Halsted, LCSW ?Phone Number: ?09/09/2021, 12:04 PM ? ?Clinical Narrative:    ?12pm-CSW spoke with patient and provided SNF bed offers. She stated she would like [facility] because she wants to be as close to [place] as possible where she lives. She reported agreement for CSW to contact her son as well to confirm choice. CSW left voicemail for Harrell Gave and will begin insurance authorization process pending a facility choice, Ref# [id number]. ? ?5:14pm-CSW contacted son again, no answer.  ? ? ?Expected Discharge Plan: [place] ?Barriers to Discharge: Continued Medical Work up, Ship broker, SNF Pending bed offer ? ?Expected Discharge Plan and Services ?Expected Discharge Plan: [place] ?In-house Referral: Clinical Social Work ?  ?Post Acute Care Choice: [place] ?Living arrangements for the past 2 months: [place] ?                ?  ?  ?  ?  ?  ?  ?  ?  ?  ?  ? ? ?Social Determinants of Health (SDOH) Interventions ?  ? ?Readmission Risk Interventions ?   ? View : No data to display.  ?  ?  ?  ? ? ?

## 2021-09-09 NOTE — Progress Notes (Signed)
Physical Therapy Treatment ?Patient Details ?Name: Susan Mclean ?MRN: [id number] ?DOB: 1951/03/10 ?Today's Date: 09/09/2021 ? ? ?History of Present Illness Pt is a 71 y.o. F who presents 09/03/2021 with seizure like activity; pt unresponsive with L eye deviation, L weakness and nystagmus.  CTH negative for acute cva but showed chronic b/l PCA strokes R >L. MRI negative for acute infarct. Intubated 4/29-5/3. ? ?  ?PT Comments  ? ? Pt was seen this afternoon, notably requiring a specific set of cues for hand placement and reminder that she should not remove O2 without nursing permitting.  Sat was 93% on room air but pt described being SOB and compromised without it.  Replaced and sat returned to 99% with pt voicing more comfort to be up in chair.  Continue to work on balance and control of standing with AD, remind pt about sequence of movement and reinforce safety of the tasks such as not trying to move alone and leaving O2 on unless nursing is testing her on room air.  Follow up for all goals of acute PT and recommend CIR due to her family support for follow up and her progress so far with balance and strength.  ?Recommendations for follow up therapy are one component of a multi-disciplinary discharge planning process, led by the attending physician.  Recommendations may be updated based on patient status, additional functional criteria and insurance authorization. ? ?Follow Up Recommendations ? Acute inpatient rehab (3hours/day) ?  ?  ?Assistance Recommended at Discharge Frequent or constant Supervision/Assistance  ?Patient can return home with the following A lot of help with walking and/or transfers;A lot of help with bathing/dressing/bathroom;Assistance with cooking/housework;Assist for transportation;Help with stairs or ramp for entrance;Direct supervision/assist for medications management;Direct supervision/assist for financial management ?  ?Equipment Recommendations ? BSC/3in1;Wheelchair (measurements  PT);Wheelchair cushion (measurements PT)  ?  ?Recommendations for Other Services Rehab consult ? ? ?  ?Precautions / Restrictions Precautions ?Precautions: Fall ?Precaution Comments: monitor HR and sats ?Restrictions ?Weight Bearing Restrictions: No  ?  ? ?Mobility ? Bed Mobility ?Overal bed mobility: Needs Assistance ?Bed Mobility: Supine to Sit ?  ?  ?Supine to sit: Min assist ?  ?  ?General bed mobility comments: pt is assisted with lines but can initiate to get to side of bed ?  ? ?Transfers ?Overall transfer level: Needs assistance ?Equipment used: Rolling walker (2 wheels) ?Transfers: Sit to/from Stand, Bed to chair/wheelchair/BSC ?Sit to Stand: Mod assist ?  ?Step pivot transfers: Mod assist ?  ?  ?  ?General transfer comment: mod assist with reminders for safety and direction, for exact placement of hands ?  ? ?Ambulation/Gait ?  ?  ?  ?  ?  ?  ?  ?General Gait Details: unable to attempt ? ? ?Stairs ?  ?  ?  ?  ?  ? ? ?Wheelchair Mobility ?  ? ?Modified Rankin (Stroke Patients Only) ?  ? ? ?  ?Balance Overall balance assessment: Needs assistance ?Sitting-balance support: Feet supported ?Sitting balance-Leahy Scale: Fair ?  ?  ?Standing balance support: Bilateral upper extremity supported ?Standing balance-Leahy Scale: Poor ?  ?  ?  ?  ?  ?  ?  ?  ?  ?  ?  ?  ?  ? ?  ?Cognition Arousal/Alertness: Awake/alert ?Behavior During Therapy: Impulsive ?Overall Cognitive Status: Impaired/Different from baseline ?Area of Impairment: Safety/judgement, Following commands ?  ?  ?  ?  ?  ?  ?  ?  ?  ?  ?Memory: Decreased  short-term memory, Decreased recall of precautions ?Following Commands: Follows one step commands with increased time, Follows one step commands inconsistently ?Safety/Judgement: Decreased awareness of safety, Decreased awareness of deficits ?  ?  ?General Comments: pt is reporting she is able to get herself to the chair, unaware of her limitations ?  ?  ? ?  ?Exercises   ? ?  ?General Comments General  comments (skin integrity, edema, etc.): pt has demonstrated better control of transfers and balance standing than last PT note but is also too confident that she can move with less help than is really required.  Safety concerns to get to chair and required very specific instructions for safety.  Up with all chair alarms and call light ?  ?  ? ?Pertinent Vitals/Pain Pain Assessment ?Pain Assessment: No/denies pain  ? ? ?Home Living   ?  ?  ?  ?  ?  ?  ?  ?  ?  ?   ?  ?Prior Function    ?  ?  ?   ? ?PT Goals (current goals can now be found in the care plan section) Acute Rehab PT Goals ?Patient Stated Goal: did not state ?Progress towards PT goals: Progressing toward goals ? ?  ?Frequency ? ? ? Min 3X/week ? ? ? ?  ?PT Plan Current plan remains appropriate  ? ? ?Co-evaluation   ?  ?  ?  ?  ? ?  ?AM-PAC PT "6 Clicks" Mobility   ?Outcome Measure ? Help needed turning from your back to your side while in a flat bed without using bedrails?: A Little ?Help needed moving from lying on your back to sitting on the side of a flat bed without using bedrails?: A Little ?Help needed moving to and from a bed to a chair (including a wheelchair)?: A Lot ?Help needed standing up from a chair using your arms (e.g., wheelchair or bedside chair)?: A Lot ?Help needed to walk in hospital room?: Total ?Help needed climbing 3-5 steps with a railing? : Total ?6 Click Score: 12 ? ?  ?End of Session Equipment Utilized During Treatment: Gait belt;Oxygen ?Activity Tolerance: Patient tolerated treatment well ?Patient left: in chair;with call bell/phone within reach;with chair alarm set ?Nurse Communication: Mobility status ?PT Visit Diagnosis: Unsteadiness on feet (R26.81);Muscle weakness (generalized) (M62.81);Difficulty in walking, not elsewhere classified (R26.2) ?  ? ? ?Time: [id number] ?PT Time Calculation (min) (ACUTE ONLY): 20 min ? ?Charges:  $Therapeutic Activity: 8-22 mins ?$Neuromuscular Re-education: 8-22 mins    ?Ramond Dial ?09/09/2021, 4:51 PM ? ?Mee Hives, PT PhD ?Acute Rehab Dept. Number: [facility] [phone] and [place] [phone] ? ? ?

## 2021-09-10 LAB — GLUCOSE, CAPILLARY
Glucose-Capillary: 129 mg/dL — ABNORMAL HIGH (ref 70–99)
Glucose-Capillary: 174 mg/dL — ABNORMAL HIGH (ref 70–99)
Glucose-Capillary: 84 mg/dL (ref 70–99)
Glucose-Capillary: 91 mg/dL (ref 70–99)
Glucose-Capillary: 92 mg/dL (ref 70–99)
Glucose-Capillary: 98 mg/dL (ref 70–99)

## 2021-09-10 LAB — BASIC METABOLIC PANEL
Anion gap: 5 (ref 5–15)
BUN: 30 mg/dL — ABNORMAL HIGH (ref 8–23)
CO2: 21 mmol/L — ABNORMAL LOW (ref 22–32)
Calcium: 9 mg/dL (ref 8.9–10.3)
Chloride: 116 mmol/L — ABNORMAL HIGH (ref 98–111)
Creatinine, Ser: 2.23 mg/dL — ABNORMAL HIGH (ref 0.44–1.00)
GFR, Estimated: 23 mL/min — ABNORMAL LOW (ref >60)
Glucose, Bld: 92 mg/dL (ref 70–99)
Potassium: 4.5 mmol/L (ref 3.5–5.1)
Sodium: 142 mmol/L (ref 135–145)

## 2021-09-10 LAB — CBC
HCT: 30.1 % — ABNORMAL LOW (ref 36.0–46.0)
Hemoglobin: 9.5 g/dL — ABNORMAL LOW (ref 12.0–15.0)
MCH: 27.6 pg (ref 26.0–34.0)
MCHC: 31.6 g/dL (ref 30.0–36.0)
MCV: 87.5 fL (ref 80.0–100.0)
Platelets: 125 10*3/uL — ABNORMAL LOW (ref 150–400)
RBC: 3.44 MIL/uL — ABNORMAL LOW (ref 3.87–5.11)
RDW: 14.5 % (ref 11.5–15.5)
WBC: 5.1 10*3/uL (ref 4.0–10.5)
nRBC: 0 % (ref 0.0–0.2)

## 2021-09-10 NOTE — TOC Progression Note (Signed)
Transition of Care (TOC) - Progression Note  ? ? ?Patient Details  ?Name: Delise Simenson ?MRN: [id number] ?Date of Birth: 10/18/1950 ? ?Transition of Care (TOC) CM/SW Contact  ?[place], [place], [place] ?Phone Number: ?09/10/2021, 12:15 PM ? ?Clinical Narrative:    ? ?Phone call to patient's son Harrell Gave to confirm choice. VM left requesting a return call. ? ?[employer], LCSW ?Transition of Care ?[phone] ? ? ?Expected Discharge Plan: [place] ?Barriers to Discharge: Continued Medical Work up, Ship broker, SNF Pending bed offer ? ?Expected Discharge Plan and Services ?Expected Discharge Plan: [place] ?In-house Referral: Clinical Social Work ?  ?Post Acute Care Choice: [place] ?Living arrangements for the past 2 months: [place] ?                ?  ?  ?  ?  ?  ?  ?  ?  ?  ?  ? ? ?Social Determinants of Health (SDOH) Interventions ?  ? ?Readmission Risk Interventions ?   ? View : No data to display.  ?  ?  ?  ? ? ?

## 2021-09-10 NOTE — TOC Progression Note (Signed)
Transition of Care (TOC) - Progression Note  ? ? ?Patient Details  ?Name: Susan Mclean ?MRN: [id number] ?Date of Birth: 05-10-1950 ? ?Transition of Care (TOC) CM/SW Contact  ?Emeterio Reeve, LCSW ?Phone Number: ?09/10/2021, 11:56 AM ? ?Clinical Narrative:    ? ?Pts insurance Josem Kaufmann is pending.  ? ?Expected Discharge Plan: [place] ?Barriers to Discharge: Continued Medical Work up, Ship broker, SNF Pending bed offer ? ?Expected Discharge Plan and Services ?Expected Discharge Plan: [place] ?In-house Referral: Clinical Social Work ?  ?Post Acute Care Choice: [place] ?Living arrangements for the past 2 months: [place] ?                ?  ?  ?  ?  ?  ?  ?  ?  ?  ?  ? ? ?Social Determinants of Health (SDOH) Interventions ?  ? ?Readmission Risk Interventions ?   ? View : No data to display.  ?  ?  ?  ? ?Emeterio Reeve, LCSW ?Clinical Social Worker ? ?

## 2021-09-10 NOTE — Progress Notes (Signed)
?PROGRESS NOTE ? ? ? ?Susan Mclean  GGY:[id number] DOB: 1951/02/28 DOA: 09/03/2021 ?PCP: Pcp, No  ? ? ?No chief complaint on file. ? ? ?Brief Narrative:  ? ?71 yo female with pmh of afib on eliquis, h/o dvt, HFpEF, htn, dm2, cad, ntesmi, ckd who presented to osh unresponsive and with L sided eye deviation, L weakness and nystagmus. All history obtained from chart as pt is intubated and unresponsive.  ?  ?She was reportedly last known normal at 2300. Presented to [facility]. GCS 3 and was emergently intubated. She had spontaneous movement of R and no movement on L. Her L gaze deviation resolved. She was started on propofol and loaded with Keppra. CTH negative for acute cva but showed chronic b/l PCA strokes R >L.  Patient was transferred from [facility] to [facility] for long-term EEG.   ? ? ?Assessment & Plan: ?  ?Principal Problem: ?  Seizure ([place]) ?Active Problems: ?  Respiratory failure ([place]) ? ? ?Acute encephalopathy  ?Seizure like activity ?- Repeat CT head was stable ?- EEG with no further seizures ?- MRI with chronic bilateral occipital infarcts ?-Appreciate Neuro input, continue Keppra 500mg  bid,  ?-I have discussed with patient's in detail seizures precautions. ?-PT/OT recommending SNF. ?  ? Acute hypoxic resp failure in the setting of seizure activity ?-extubated to [place], wean as able ?-She was encouraged to use incentive spirometer. ?-Wean oxygen as tolerated ?  ? Atrial Fibrillation  ?-Initially on heparin GTT, now passed swallow evaluation changed to Eliquis. ?-Initially insulin drip which has been discontinued, heart rate is acceptable on home dose Coreg ?  ?Thrombocytopenia ?-stable and improving, likely secondary to acute illness ?  ?HFpEF, HTN, HL ?-no acute exacerbation ?-continue statin ?-resume Plavix couple days when she is more stable. ?  ?T2dm:  ?-continue SSI ?-a1c in 11/22 =6.2 ?  ?H/o CVA:  ?-resume Plavix ?  ?CKD III-IV ?Creatinine near baseline  ?-stable  ?  ?Metabolic acidosis: ? improving ?  ?   ? ? ?DVT prophylaxis: Heparin GTT>> Eliquis ?Code Status: Full ?Family Communication: None at bedside ?Disposition:  ? ?Status is: Inpatient, patient stable for discharge to SNF once bed is available. ? ? ?Consultants:  ?PCCM ?Neurology ? ? ?Subjective: ? ?No significant events overnight as discussed with staff, she denies any complaints. ? ?Objective: ?Vitals:  ? 09/09/21 2319 09/10/21 0154 09/10/21 0326 09/10/21 0350  ?BP: 120/68  138/79 (!) 151/91  ?Pulse: 64  70 65  ?Resp: 19  18 16   ?Temp: 97.8 ?F (36.6 ?C)  97.8 ?F (36.6 ?C) (!) 97.3 ?F (36.3 ?C)  ?TempSrc: Oral  Oral Oral  ?SpO2:    95%  ?Weight:  82 kg    ?Height:      ? ?No intake or output data in the 24 hours ending 09/10/21 1233 ? ?Filed Weights  ? 09/08/21 0500 09/09/21 0500 09/10/21 0154  ?Weight: 90.5 kg 88.9 kg 82 kg  ? ? ?Examination: ? ?Awake Alert, Oriented X 3, No new F.N deficits, Normal affect ?Symmetrical Chest wall movement, Good air movement bilaterally, CTAB ?RRR,No Gallops,Rubs or new Murmurs, No Parasternal Heave ?+ve B.Sounds, Abd Soft, No tenderness, No rebound - guarding or rigidity. ?No Cyanosis, Clubbing or edema, No new Rash or bruise   ? ? ? ? ? ? ?Data Reviewed: I have personally reviewed following labs and imaging studies ? ?CBC: ?Recent Labs  ?Lab 09/06/21 ?0319 09/07/21 ?0938 09/08/21 ?1829 09/09/21 ?0120 09/10/21 ?0051  ?WBC 8.4 7.4 5.6 4.5 5.1  ?HGB  10.5* 9.5* 9.1* 8.9* 9.5*  ?HCT 33.1* 29.6* 28.6* 27.9* 30.1*  ?MCV 88.0 88.1 87.2 87.2 87.5  ?PLT 98* 100* 105* 114* 125*  ? ? ?Basic Metabolic Panel: ?Recent Labs  ?Lab 09/04/21 ?6063 09/05/21 ?0615 09/06/21 ?0319 09/07/21 ?0160 09/08/21 ?1093 09/09/21 ?0120 09/10/21 ?0051  ?NA 144   < > 143 144 144 142 142  ?K 4.6   < > 4.8 5.1 4.8 4.4 4.5  ?CL 121*   < > 118* 118* 118* 119* 116*  ?CO2 19*   < > 18* 19* 20* 20* 21*  ?GLUCOSE 155*   < > 138* 94 93 100* 92  ?BUN 24*   < > 35* 34* 29* 28* 30*  ?CREATININE 1.98*   < > 2.12* 2.05* 1.87* 2.06* 2.23*  ?CALCIUM 8.8*   < > 8.6* 8.8*  9.0 8.8* 9.0  ?MG 1.8  --   --  1.8  --   --   --   ?PHOS  --   --   --  4.5  --   --   --   ? < > = values in this interval not displayed.  ? ? ?GFR: ?Estimated Creatinine Clearance: 22.8 mL/min (A) (by C-G formula based on SCr of 2.23 mg/dL (H)). ? ?Liver Function Tests: ?No results for input(s): AST, ALT, ALKPHOS, BILITOT, PROT, ALBUMIN in the last 168 hours. ? ? ?CBG: ?Recent Labs  ?Lab 09/09/21 ?1824 09/09/21 ?1940 09/09/21 ?2318 09/10/21 ?0325 09/10/21 ?0847  ?GLUCAP 98 147* 90 84 98  ? ? ? ?Recent Results (from the past 240 hour(s))  ?Resp Panel by RT-PCR (Flu A&B, Covid) Nasopharyngeal Swab     Status: None  ? Collection Time: 09/03/21 12:33 AM  ? Specimen: Nasopharyngeal Swab; Nasopharyngeal(NP) swabs in vial transport medium  ?Result Value Ref Range Status  ? SARS Coronavirus 2 by RT PCR NEGATIVE NEGATIVE Final  ?  Comment: (NOTE) ?SARS-CoV-2 target nucleic acids are NOT DETECTED. ? ?The SARS-CoV-2 RNA is generally detectable in upper respiratory ?specimens during the acute phase of infection. The lowest ?concentration of SARS-CoV-2 viral copies this assay can detect is ?138 copies/mL. A negative result does not preclude SARS-Cov-2 ?infection and should not be used as the sole basis for treatment or ?other patient management decisions. A negative result may occur with  ?improper specimen collection/handling, submission of specimen other ?than nasopharyngeal swab, presence of viral mutation(s) within the ?areas targeted by this assay, and inadequate number of viral ?copies(<138 copies/mL). A negative result must be combined with ?clinical observations, patient history, and epidemiological ?information. The expected result is Negative. ? ?Fact Sheet for Patients:  ?EntrepreneurPulse.com.au ? ?Fact Sheet for Healthcare Providers:  ?IncredibleEmployment.be ? ?This test is no t yet approved or cleared by the [place] FDA and  ?has been authorized for detection and/or  diagnosis of SARS-CoV-2 by ?FDA under an Emergency Use Authorization (EUA). This EUA will remain  ?in effect (meaning this test can be used) for the duration of the ?COVID-19 declaration under Section 564(b)(1) of the Act, 21 ?U.S.C.section 360bbb-3(b)(1), unless the authorization is terminated  ?or revoked sooner.  ? ? ?  ? Influenza A by PCR NEGATIVE NEGATIVE Final  ? Influenza B by PCR NEGATIVE NEGATIVE Final  ?  Comment: (NOTE) ?The Xpert Xpress SARS-CoV-2/FLU/RSV plus assay is intended as an aid ?in the diagnosis of influenza from Nasopharyngeal swab specimens and ?should not be used as a sole basis for treatment. Nasal washings and ?aspirates are unacceptable for Xpert Xpress SARS-CoV-2/FLU/RSV ?  testing. ? ?Fact Sheet for Patients: ?EntrepreneurPulse.com.au ? ?Fact Sheet for Healthcare Providers: ?IncredibleEmployment.be ? ?This test is not yet approved or cleared by the [place] FDA and ?has been authorized for detection and/or diagnosis of SARS-CoV-2 by ?FDA under an Emergency Use Authorization (EUA). This EUA will remain ?in effect (meaning this test can be used) for the duration of the ?COVID-19 declaration under Section 564(b)(1) of the Act, 21 U.S.C. ?section 360bbb-3(b)(1), unless the authorization is terminated or ?revoked. ? ?Performed at [facility], [place], ?[place] [address] ?  ?Culture, blood (Routine X 2) w Reflex to ID Panel     Status: None  ? Collection Time: 09/03/21  5:34 AM  ? Specimen: BLOOD  ?Result Value Ref Range Status  ? Specimen Description BLOOD SITE NOT SPECIFIED  Final  ? Special Requests AEROBIC BOTTLE ONLY Blood Culture adequate volume  Final  ? Culture   Final  ?  NO GROWTH 5 DAYS ?Performed at [facility], [place] [address], [place], [place] [address] ?  ? Report Status 09/08/2021 FINAL  Final  ?Culture, blood (Routine X 2) w Reflex to ID Panel     Status: None  ? Collection Time: 09/03/21  6:50 AM  ?  Specimen: BLOOD  ?Result Value Ref Range Status  ? Specimen Description BLOOD BLOOD LEFT HAND  Final  ? Special Requests   Final  ?  BOTTLES DRAWN AEROBIC AND ANAEROBIC Blood Culture results may not be optimal due

## 2021-09-11 LAB — GLUCOSE, CAPILLARY
Glucose-Capillary: 102 mg/dL — ABNORMAL HIGH (ref 70–99)
Glucose-Capillary: 102 mg/dL — ABNORMAL HIGH (ref 70–99)
Glucose-Capillary: 67 mg/dL — ABNORMAL LOW (ref 70–99)
Glucose-Capillary: 117 mg/dL — ABNORMAL HIGH (ref 70–99)
Glucose-Capillary: 156 mg/dL — ABNORMAL HIGH (ref 70–99)
Glucose-Capillary: 159 mg/dL — ABNORMAL HIGH (ref 70–99)

## 2021-09-11 LAB — CBC
HCT: 27.5 % — ABNORMAL LOW (ref 36.0–46.0)
Hemoglobin: 8.8 g/dL — ABNORMAL LOW (ref 12.0–15.0)
MCH: 27.7 pg (ref 26.0–34.0)
MCHC: 32 g/dL (ref 30.0–36.0)
MCV: 86.5 fL (ref 80.0–100.0)
Platelets: 132 10*3/uL — ABNORMAL LOW (ref 150–400)
RBC: 3.18 MIL/uL — ABNORMAL LOW (ref 3.87–5.11)
RDW: 14.4 % (ref 11.5–15.5)
WBC: 5.7 10*3/uL (ref 4.0–10.5)
nRBC: 0 % (ref 0.0–0.2)

## 2021-09-11 LAB — BASIC METABOLIC PANEL
Anion gap: 5 (ref 5–15)
BUN: 30 mg/dL — ABNORMAL HIGH (ref 8–23)
CO2: 20 mmol/L — ABNORMAL LOW (ref 22–32)
Calcium: 8.6 mg/dL — ABNORMAL LOW (ref 8.9–10.3)
Chloride: 116 mmol/L — ABNORMAL HIGH (ref 98–111)
Creatinine, Ser: 2.21 mg/dL — ABNORMAL HIGH (ref 0.44–1.00)
GFR, Estimated: 23 mL/min — ABNORMAL LOW (ref >60)
Glucose, Bld: 64 mg/dL — ABNORMAL LOW (ref 70–99)
Potassium: 4.3 mmol/L (ref 3.5–5.1)
Sodium: 141 mmol/L (ref 135–145)

## 2021-09-11 NOTE — Progress Notes (Signed)
?PROGRESS NOTE ? ? ? ?Susan Mclean  IOX:[id number] DOB: 09-Apr-1951 DOA: 09/03/2021 ?PCP: Pcp, No  ? ? ?No chief complaint on file. ? ? ?Brief Narrative:  ? ?71 yo female with pmh of afib on eliquis, h/o dvt, HFpEF, htn, dm2, cad, ntesmi, ckd who presented to osh unresponsive and with L sided eye deviation, L weakness and nystagmus. All history obtained from chart as pt is intubated and unresponsive.  ?  ?She was reportedly last known normal at 2300. Presented to [facility]. GCS 3 and was emergently intubated. She had spontaneous movement of R and no movement on L. Her L gaze deviation resolved. She was started on propofol and loaded with Keppra. CTH negative for acute cva but showed chronic b/l PCA strokes R >L.  Patient was transferred from [facility] to [facility] for long-term EEG.   ? ? ?Assessment & Plan: ?  ?Principal Problem: ?  Seizure ([place]) ?Active Problems: ?  Respiratory failure ([place]) ? ? ?Acute encephalopathy  ?Seizure like activity ?- Repeat CT head was stable ?- EEG with no further seizures ?- MRI with chronic bilateral occipital infarcts ?-Appreciate Neuro input, continue Keppra 500mg  bid,  ?-I have discussed with patient's in detail seizures precautions. ?-PT/OT recommending SNF. ?  ? Acute hypoxic resp failure in the setting of seizure activity ?-extubated to [place], wean as able ?-She was encouraged to use incentive spirometer. ?-Wean oxygen as tolerated ?  ? Atrial Fibrillation  ?-Initially on heparin GTT, now passed swallow evaluation changed to Eliquis. ?-Initially insulin drip which has been discontinued, heart rate is acceptable on home dose Coreg ?  ?Thrombocytopenia ?-stable and improving, likely secondary to acute illness ?  ?HFpEF, HTN, HL ?-no acute exacerbation ?-continue statin ?-resume Plavix couple days when she is more stable. ?  ?T2dm:  ?-continue SSI ?-a1c in 11/22 =6.2 ?  ?H/o CVA:  ?-resume Plavix ?  ?CKD III-IV ?Creatinine near baseline  ?-stable  ?  ?Metabolic acidosis: ? improving ?  ?   ? ? ?DVT prophylaxis: Eliquis ?Code Status: Full ?Family Communication: None at bedside ?Disposition:  ? ?Status is: Inpatient, patient stable for discharge to SNF once bed is available. ? ? ?Consultants:  ?PCCM ?Neurology ? ? ?Subjective: ? ?No significant events overnight as discussed with staff, she denies any complaints. ? ?Objective: ?Vitals:  ? 09/11/21 0103 09/11/21 0323 09/11/21 0823 09/11/21 1250  ?BP:  118/69 95/81 120/62  ?Pulse:  63 65 70  ?Resp:  20 17 20   ?Temp:  97.8 ?F (36.6 ?C) 97.8 ?F (36.6 ?C) 97.9 ?F (36.6 ?C)  ?TempSrc:  Oral Oral Oral  ?SpO2:      ?Weight: 85.5 kg     ?Height:      ? ? ?Intake/Output Summary (Last 24 hours) at 09/11/2021 1329 ?Last data filed at 09/11/2021 1251 ?Gross per 24 hour  ?Intake 939.77 ml  ?Output --  ?Net 939.77 ml  ? ? ?Filed Weights  ? 09/09/21 0500 09/10/21 0154 09/11/21 0103  ?Weight: 88.9 kg 82 kg 85.5 kg  ? ? ?Examination: ? ?Awake Alert, Oriented X 3, No new F.N deficits, Normal affect ?Symmetrical Chest wall movement, Good air movement bilaterally, CTAB ?RRR,No Gallops,Rubs or new Murmurs, No Parasternal Heave ?+ve B.Sounds, Abd Soft, No tenderness, No rebound - guarding or rigidity. ?No Cyanosis, Clubbing or edema, No new Rash or bruise   ? ? ? ? ? ? ?Data Reviewed: I have personally reviewed following labs and imaging studies ? ?CBC: ?Recent Labs  ?Lab 09/07/21 ?2683 09/08/21 ?  7425 09/09/21 ?0120 09/10/21 ?0051 09/11/21 ?0040  ?WBC 7.4 5.6 4.5 5.1 5.7  ?HGB 9.5* 9.1* 8.9* 9.5* 8.8*  ?HCT 29.6* 28.6* 27.9* 30.1* 27.5*  ?MCV 88.1 87.2 87.2 87.5 86.5  ?PLT 100* 105* 114* 125* 132*  ? ? ?Basic Metabolic Panel: ?Recent Labs  ?Lab 09/07/21 ?9563 09/08/21 ?8756 09/09/21 ?0120 09/10/21 ?0051 09/11/21 ?0040  ?NA 144 144 142 142 141  ?K 5.1 4.8 4.4 4.5 4.3  ?CL 118* 118* 119* 116* 116*  ?CO2 19* 20* 20* 21* 20*  ?GLUCOSE 94 93 100* 92 64*  ?BUN 34* 29* 28* 30* 30*  ?CREATININE 2.05* 1.87* 2.06* 2.23* 2.21*  ?CALCIUM 8.8* 9.0 8.8* 9.0 8.6*  ?MG 1.8  --   --   --   --    ?PHOS 4.5  --   --   --   --   ? ? ?GFR: ?Estimated Creatinine Clearance: 23.5 mL/min (A) (by C-G formula based on SCr of 2.21 mg/dL (H)). ? ?Liver Function Tests: ?No results for input(s): AST, ALT, ALKPHOS, BILITOT, PROT, ALBUMIN in the last 168 hours. ? ? ?CBG: ?Recent Labs  ?Lab 09/10/21 ?2340 09/11/21 ?0325 09/11/21 ?0420 09/11/21 ?0827 09/11/21 ?1256  ?GLUCAP 92 102* 102* 67* 117*  ? ? ? ?Recent Results (from the past 240 hour(s))  ?Resp Panel by RT-PCR (Flu A&B, Covid) Nasopharyngeal Swab     Status: None  ? Collection Time: 09/03/21 12:33 AM  ? Specimen: Nasopharyngeal Swab; Nasopharyngeal(NP) swabs in vial transport medium  ?Result Value Ref Range Status  ? SARS Coronavirus 2 by RT PCR NEGATIVE NEGATIVE Final  ?  Comment: (NOTE) ?SARS-CoV-2 target nucleic acids are NOT DETECTED. ? ?The SARS-CoV-2 RNA is generally detectable in upper respiratory ?specimens during the acute phase of infection. The lowest ?concentration of SARS-CoV-2 viral copies this assay can detect is ?138 copies/mL. A negative result does not preclude SARS-Cov-2 ?infection and should not be used as the sole basis for treatment or ?other patient management decisions. A negative result may occur with  ?improper specimen collection/handling, submission of specimen other ?than nasopharyngeal swab, presence of viral mutation(s) within the ?areas targeted by this assay, and inadequate number of viral ?copies(<138 copies/mL). A negative result must be combined with ?clinical observations, patient history, and epidemiological ?information. The expected result is Negative. ? ?Fact Sheet for Patients:  ?EntrepreneurPulse.com.au ? ?Fact Sheet for Healthcare Providers:  ?IncredibleEmployment.be ? ?This test is no t yet approved or cleared by the [place] FDA and  ?has been authorized for detection and/or diagnosis of SARS-CoV-2 by ?FDA under an Emergency Use Authorization (EUA). This EUA will remain  ?in effect  (meaning this test can be used) for the duration of the ?COVID-19 declaration under Section 564(b)(1) of the Act, 21 ?U.S.C.section 360bbb-3(b)(1), unless the authorization is terminated  ?or revoked sooner.  ? ? ?  ? Influenza A by PCR NEGATIVE NEGATIVE Final  ? Influenza B by PCR NEGATIVE NEGATIVE Final  ?  Comment: (NOTE) ?The Xpert Xpress SARS-CoV-2/FLU/RSV plus assay is intended as an aid ?in the diagnosis of influenza from Nasopharyngeal swab specimens and ?should not be used as a sole basis for treatment. Nasal washings and ?aspirates are unacceptable for Xpert Xpress SARS-CoV-2/FLU/RSV ?testing. ? ?Fact Sheet for Patients: ?EntrepreneurPulse.com.au ? ?Fact Sheet for Healthcare Providers: ?IncredibleEmployment.be ? ?This test is not yet approved or cleared by the [place] FDA and ?has been authorized for detection and/or diagnosis of SARS-CoV-2 by ?FDA under an Emergency Use Authorization (EUA). This EUA will  remain ?in effect (meaning this test can be used) for the duration of the ?COVID-19 declaration under Section 564(b)(1) of the Act, 21 U.S.C. ?section 360bbb-3(b)(1), unless the authorization is terminated or ?revoked. ? ?Performed at [facility], [place], ?[place] [address] ?  ?Culture, blood (Routine X 2) w Reflex to ID Panel     Status: None  ? Collection Time: 09/03/21  5:34 AM  ? Specimen: BLOOD  ?Result Value Ref Range Status  ? Specimen Description BLOOD SITE NOT SPECIFIED  Final  ? Special Requests AEROBIC BOTTLE ONLY Blood Culture adequate volume  Final  ? Culture   Final  ?  NO GROWTH 5 DAYS ?Performed at [facility], [place] [address] [place], [place] [address] ?  ? Report Status 09/08/2021 FINAL  Final  ?Culture, blood (Routine X 2) w Reflex to ID Panel     Status: None  ? Collection Time: 09/03/21  6:50 AM  ? Specimen: BLOOD  ?Result Value Ref Range Status  ? Specimen Description BLOOD BLOOD LEFT HAND  Final  ? Special  Requests   Final  ?  BOTTLES DRAWN AEROBIC AND ANAEROBIC Blood Culture results may not be optimal due to an inadequate volume of blood received in culture bottles  ? Culture   Final  ?  NO GROWTH 5 DAYS ?P

## 2021-09-11 NOTE — Plan of Care (Signed)
?  Problem: Safety: ?Goal: Non-violent Restraint(s) ?Outcome: Progressing ?  ?Problem: Education: ?Goal: Expressions of having a comfortable level of knowledge regarding the disease process will increase ?Outcome: Progressing ?  ?Problem: Medication: ?Goal: Risk for medication side effects will decrease ?Outcome: Progressing ?  ?Problem: Clinical Measurements: ?Goal: Complications related to the disease process, condition or treatment will be avoided or minimized ?Outcome: Progressing ?Goal: Diagnostic test results will improve ?Outcome: Progressing ?  ?Problem: Safety: ?Goal: Verbalization of understanding the information provided will improve ?Outcome: Progressing ?  ?Problem: Education: ?Goal: Knowledge of General Education information will improve ?Description: Including pain rating scale, medication(s)/side effects and non-pharmacologic comfort measures ?Outcome: Progressing ?  ?Problem: Health Behavior/Discharge Planning: ?Goal: Ability to manage health-related needs will improve ?Outcome: Progressing ?  ?Problem: Clinical Measurements: ?Goal: Ability to maintain clinical measurements within normal limits will improve ?Outcome: Progressing ?Goal: Will remain free from infection ?Outcome: Progressing ?Goal: Diagnostic test results will improve ?Outcome: Progressing ?Goal: Respiratory complications will improve ?Outcome: Progressing ?Goal: Cardiovascular complication will be avoided ?Outcome: Progressing ?  ?Problem: Activity: ?Goal: Risk for activity intolerance will decrease ?Outcome: Progressing ?  ?Problem: Nutrition: ?Goal: Adequate nutrition will be maintained ?Outcome: Progressing ?  ?Problem: Coping: ?Goal: Level of anxiety will decrease ?Outcome: Progressing ?  ?Problem: Elimination: ?Goal: Will not experience complications related to bowel motility ?Outcome: Progressing ?Goal: Will not experience complications related to urinary retention ?Outcome: Progressing ?  ?Problem: Pain Managment: ?Goal: General  experience of comfort will improve ?Outcome: Progressing ?  ?Problem: Safety: ?Goal: Ability to remain free from injury will improve ?Outcome: Progressing ?  ?Problem: Skin Integrity: ?Goal: Risk for impaired skin integrity will decrease ?Outcome: Progressing ?  ?

## 2021-09-11 NOTE — TOC Progression Note (Signed)
Transition of Care (TOC) - Progression Note  ? ? ?Patient Details  ?Name: Susan Mclean ?MRN: [id number] ?Date of Birth: 1951-03-11 ? ?Transition of Care (TOC) CM/SW Contact  ?[place], [place], [place] ?Phone Number: ?09/11/2021, 3:05 PM ? ?Clinical Narrative:    ?Phone call to patient's son Harrell Gave to confirm bed choice of [facility]. They do not accept new admissions on Sundays. TOC to continue to follow ? ?Vedha Tercero, LCSW ?Transition of Care ?[phone] ? ? ? ?Expected Discharge Plan: [place] ?Barriers to Discharge: Continued Medical Work up, Ship broker, SNF Pending bed offer ? ?Expected Discharge Plan and Services ?Expected Discharge Plan: [place] ?In-house Referral: Clinical Social Work ?  ?Post Acute Care Choice: [place] ?Living arrangements for the past 2 months: [place] ?                ?  ?  ?  ?  ?  ?  ?  ?  ?  ?  ? ? ?Social Determinants of Health (SDOH) Interventions ?  ? ?Readmission Risk Interventions ?   ? View : No data to display.  ?  ?  ?  ? ? ?

## 2021-09-12 LAB — GLUCOSE, CAPILLARY
Glucose-Capillary: 100 mg/dL — ABNORMAL HIGH (ref 70–99)
Glucose-Capillary: 143 mg/dL — ABNORMAL HIGH (ref 70–99)
Glucose-Capillary: 74 mg/dL (ref 70–99)
Glucose-Capillary: 75 mg/dL (ref 70–99)
Glucose-Capillary: 91 mg/dL (ref 70–99)
Glucose-Capillary: 97 mg/dL (ref 70–99)

## 2021-09-12 MED ORDER — LEVETIRACETAM 500 MG PO TABS
500.0000 mg | ORAL_TABLET | Freq: Two times a day (BID) | ORAL | Status: DC
Start: 2021-09-12 — End: 2021-09-30

## 2021-09-12 MED ORDER — ENSURE ENLIVE PO LIQD: 237.0000 mL | Freq: Two times a day (BID) | ORAL | 12 refills | Status: DC

## 2021-09-12 MED ORDER — ADULT MULTIVITAMIN W/MINERALS CH: 1.0000 | ORAL_TABLET | Freq: Every day | ORAL | Status: AC

## 2021-09-12 MED ORDER — PANTOPRAZOLE SODIUM 40 MG PO TBEC: 40.0000 mg | DELAYED_RELEASE_TABLET | Freq: Every day | ORAL | Status: DC

## 2021-09-12 NOTE — Plan of Care (Signed)
?  Problem: Safety: ?Goal: Non-violent Restraint(s) ?Outcome: Completed/Met ?  ?Problem: Education: ?Goal: Expressions of having a comfortable level of knowledge regarding the disease process will increase ?Outcome: Completed/Met ?  ?Problem: Medication: ?Goal: Risk for medication side effects will decrease ?Outcome: Completed/Met ?  ?Problem: Clinical Measurements: ?Goal: Complications related to the disease process, condition or treatment will be avoided or minimized ?Outcome: Completed/Met ?Goal: Diagnostic test results will improve ?Outcome: Completed/Met ?  ?Problem: Safety: ?Goal: Verbalization of understanding the information provided will improve ?Outcome: Completed/Met ?  ?Problem: Education: ?Goal: Knowledge of General Education information will improve ?Description: Including pain rating scale, medication(s)/side effects and non-pharmacologic comfort measures ?Outcome: Completed/Met ?  ?Problem: Health Behavior/Discharge Planning: ?Goal: Ability to manage health-related needs will improve ?Outcome: Completed/Met ?  ?Problem: Clinical Measurements: ?Goal: Ability to maintain clinical measurements within normal limits will improve ?Outcome: Completed/Met ?Goal: Will remain free from infection ?Outcome: Completed/Met ?Goal: Diagnostic test results will improve ?Outcome: Completed/Met ?Goal: Respiratory complications will improve ?Outcome: Completed/Met ?Goal: Cardiovascular complication will be avoided ?Outcome: Completed/Met ?  ?Problem: Activity: ?Goal: Risk for activity intolerance will decrease ?Outcome: Completed/Met ?  ?Problem: Nutrition: ?Goal: Adequate nutrition will be maintained ?Outcome: Completed/Met ?  ?Problem: Coping: ?Goal: Level of anxiety will decrease ?Outcome: Completed/Met ?  ?Problem: Elimination: ?Goal: Will not experience complications related to bowel motility ?Outcome: Completed/Met ?Goal: Will not experience complications related to urinary retention ?Outcome: Completed/Met ?   ?Problem: Pain Managment: ?Goal: General experience of comfort will improve ?Outcome: Completed/Met ?  ?Problem: Safety: ?Goal: Ability to remain free from injury will improve ?Outcome: Completed/Met ?  ?Problem: Skin Integrity: ?Goal: Risk for impaired skin integrity will decrease ?Outcome: Completed/Met ?  ?

## 2021-09-12 NOTE — Progress Notes (Addendum)
Given report that patient is scheduled to leave. Physical assessment done and charted. IV out during assessment. Patient appears comfortable in bed and expresses no needs at this time. This RN will continue to monitor until transport arrives ? ?2030- Transport here to pick up patient. All belongings sent with patient and VSS at the time of transfer. ?

## 2021-09-12 NOTE — TOC Progression Note (Signed)
Transition of Care (TOC) - Progression Note  ? ? ?Patient Details  ?Name: Keidy Thurgood ?MRN: [id number] ?Date of Birth: Feb 23, 1951 ? ?Transition of Care (TOC) CM/SW Contact  ?Benard Halsted, LCSW ?Phone Number: ?09/12/2021, 8:46 AM ? ?Clinical Narrative:    ?CSW received voicemail from patient's son stating preference for [facility], which is [facility]. CSW updated insurance for the authorization.  ? ? ?Expected Discharge Plan: [place] ?Barriers to Discharge: Continued Medical Work up, Ship broker, SNF Pending bed offer ? ?Expected Discharge Plan and Services ?Expected Discharge Plan: [place] ?In-house Referral: Clinical Social Work ?  ?Post Acute Care Choice: [place] ?Living arrangements for the past 2 months: [place] ?                ?  ?  ?  ?  ?  ?  ?  ?  ?  ?  ? ? ?Social Determinants of Health (SDOH) Interventions ?  ? ?Readmission Risk Interventions ?   ? View : No data to display.  ?  ?  ?  ? ? ?

## 2021-09-12 NOTE — Discharge Instructions (Signed)
Follow with Primary MD /SNF physician ? ?Get CBC, CMP, 2 view Chest X ray checked  by Primary MD next visit.  ? ? ?Activity: As tolerated with Full fall precautions use walker/cane & assistance as needed ? ? ?Disposition SNF ? ? ?Diet: Heart Healthy  ? ? ?On your next visit with your primary care physician please Get Medicines reviewed and adjusted. ? ? ?Please request your Prim.MD to go over all Hospital Tests and Procedure/Radiological results at the follow up, please get all Hospital records sent to your Prim MD by signing hospital release before you go home. ? ? ?If you experience worsening of your admission symptoms, develop shortness of breath, life threatening emergency, suicidal or homicidal thoughts you must seek medical attention immediately by calling 911 or calling your MD immediately  if symptoms less severe. ? ?You Must read complete instructions/literature along with all the possible adverse reactions/side effects for all the Medicines you take and that have been prescribed to you. Take any new Medicines after you have completely understood and accpet all the possible adverse reactions/side effects.  ? ?Do not drive, operating heavy machinery, perform activities at heights, swimming or participation in water activities or provide baby sitting services if your were admitted for syncope or siezures until you have seen by Primary MD or a Neurologist and advised to do so again. ? ?Do not drive when taking Pain medications.  ? ? ?Do not take more than prescribed Pain, Sleep and Anxiety Medications ? ?Special Instructions: If you have smoked or chewed Tobacco  in the last 2 yrs please stop smoking, stop any regular Alcohol  and or any Recreational drug use. ? ?Wear Seat belts while driving. ? ? ?Please note ? ?You were cared for by a hospitalist during your hospital stay. If you have any questions about your discharge medications or the care you received while you were in the hospital after you are  discharged, you can call the unit and asked to speak with the hospitalist on call if the hospitalist that took care of you is not available. Once you are discharged, your primary care physician will handle any further medical issues. Please note that NO REFILLS for any discharge medications will be authorized once you are discharged, as it is imperative that you return to your primary care physician (or establish a relationship with a primary care physician if you do not have one) for your aftercare needs so that they can reassess your need for medications and monitor your lab values.  ?

## 2021-09-12 NOTE — TOC Progression Note (Signed)
Transition of Care (TOC) - Progression Note  ? ? ?Patient Details  ?Name: Lyllian Gause ?MRN: [id number] ?Date of Birth: 10-22-1950 ? ?Transition of Care (TOC) CM/SW Contact  ?Benard Halsted, LCSW ?Phone Number: ?09/12/2021, 1:26 PM ? ?Clinical Narrative:    ?Insurance approval received for [employer], Ref# [id number] ID# [id number], effective 09/10/2021-09/12/2021. ? ? ?Expected Discharge Plan: [place] ?Barriers to Discharge: Barriers Resolved ? ?Expected Discharge Plan and Services ?Expected Discharge Plan: [place] ?In-house Referral: Clinical Social Work ?  ?Post Acute Care Choice: [place] ?Living arrangements for the past 2 months: [place] ?Expected Discharge Date: 09/12/21               ?  ?  ?  ?  ?  ?  ?  ?  ?  ?  ? ? ?Social Determinants of Health (SDOH) Interventions ?  ? ?Readmission Risk Interventions ?   ? View : No data to display.  ?  ?  ?  ? ? ?

## 2021-09-12 NOTE — Discharge Summary (Signed)
Physician Discharge Summary  ?Susan Mclean XLK:[id number] DOB: [birth year] DOA: 09/03/2021 ? ?PCP: Pcp, No ? ?Admit date: 09/03/2021 ?Discharge date: 09/12/2021 ? ?Admitted From: Home ?Disposition:  SNF ? ?Recommendations for Outpatient Follow-up:  ?Check CBC, BMP in 3 days ?Please be sure patient to continue his Keppra upon discharge from SNF ? ? ? ? ?Discharge Condition:Stable ?CODE STATUS: FULL ?Diet:  Heart Healthy ? ?Brief/Interim Summary: ? ?71 yo female with pmh of afib on eliquis, h/o dvt, HFpEF, htn, dm2, cad, ntesmi, ckd who presented to osh unresponsive and with L sided eye deviation, L weakness and nystagmus. All history obtained from chart as pt is intubated and unresponsive.  ?  ?She was reportedly last known normal at 2300. Presented to [facility]. GCS 3 and was emergently intubated. She had spontaneous movement of R and no movement on L. Her L gaze deviation resolved. She was started on propofol and loaded with Keppra. CTH negative for acute cva but showed chronic b/l PCA strokes R >L.  Patient was transferred from [facility] to [facility] for long-term EEG.  She was admitted to ICU, where she was successfully extubated, transferred to [facility] service.  She and tolerating Keppra, on room air. ? ?Acute encephalopathy  ?Seizures ?- Repeat CT head was stable ?- EEG with no further seizures ?- MRI with chronic bilateral occipital infarcts ?-Appreciate Neuro input, continue Keppra 500mg  bid,  ?-I have discussed with patient's in detail seizures precautions. ?-PT/OT recommending SNF. ?-Mentation back to baseline. ?  ? Acute hypoxic resp failure in the setting of seizure activity ?-Extubated ? -Currently tolerating room air, encouraged use incentive spirometer ? ? Atrial Fibrillation  ?-Initially on heparin GTT, now passed swallow evaluation changed to Eliquis. ?-Initially on amiodarone drip drip which has been discontinued, heart rate is acceptable on home dose Coreg ?  ?Thrombocytopenia ?-stable and improving, likely  secondary to acute illness ?  ?HFpEF, HTN, HL ?-no acute exacerbation ?-continue statin ?-Continue with Plavix on discharge ?  ?T2dm:  ?- a1c in 11/22 =6.2 ?- Not on any home medications ?  ?H/o CVA:  ?-resume Plavix ?  ?CKD III-IV ?Creatinine near baseline  ?-stable  ?  ?Metabolic acidosis: ? improving ?  ?  ?  ? ?Discharge Diagnoses:  ?Principal Problem: ?  Seizure ([place]) ?Active Problems: ?  Respiratory failure ([place]) ? ? ? ?Discharge Instructions ? ?Discharge Instructions   ? ? Diet - low sodium heart healthy   Complete by: As directed ?  ? Discharge instructions   Complete by: As directed ?  ? Follow with Primary MD /SNF physician ? ?Get CBC, CMP, 2 view Chest X ray checked  by Primary MD next visit.  ? ? ?Activity: As tolerated with Full fall precautions use walker/cane & assistance as needed ? ? ?Disposition SNF ? ? ?Diet: Heart Healthy  ? ? ?On your next visit with your primary care physician please Get Medicines reviewed and adjusted. ? ? ?Please request your Prim.MD to go over all Hospital Tests and Procedure/Radiological results at the follow up, please get all Hospital records sent to your Prim MD by signing hospital release before you go home. ? ? ?If you experience worsening of your admission symptoms, develop shortness of breath, life threatening emergency, suicidal or homicidal thoughts you must seek medical attention immediately by calling 911 or calling your MD immediately  if symptoms less severe. ? ?You Must read complete instructions/literature along with all the possible adverse reactions/side effects for all the Medicines you take and that  have been prescribed to you. Take any new Medicines after you have completely understood and accpet all the possible adverse reactions/side effects.  ? ?Do not drive, operating heavy machinery, perform activities at heights, swimming or participation in water activities or provide baby sitting services if your were admitted for syncope or siezures until you  have seen by Primary MD or a Neurologist and advised to do so again. ? ?Do not drive when taking Pain medications.  ? ? ?Do not take more than prescribed Pain, Sleep and Anxiety Medications ? ?Special Instructions: If you have smoked or chewed Tobacco  in the last 2 yrs please stop smoking, stop any regular Alcohol  and or any Recreational drug use. ? ?Wear Seat belts while driving. ? ? ?Please note ? ?You were cared for by a hospitalist during your hospital stay. If you have any questions about your discharge medications or the care you received while you were in the hospital after you are discharged, you can call the unit and asked to speak with the hospitalist on call if the hospitalist that took care of you is not available. Once you are discharged, your primary care physician will handle any further medical issues. Please note that NO REFILLS for any discharge medications will be authorized once you are discharged, as it is imperative that you return to your primary care physician (or establish a relationship with a primary care physician if you do not have one) for your aftercare needs so that they can reassess your need for medications and monitor your lab values.  ? Increase activity slowly   Complete by: As directed ?  ? ?  ? ?Allergies as of 09/12/2021   ?No Known Allergies ?  ? ?  ?Medication List  ?  ? ?TAKE these medications   ? ?apixaban 5 MG Tabs tablet ?Commonly known as: ELIQUIS ?Take 1 tablet (5 mg total) by mouth 2 (two) times daily. ?  ?atorvastatin 80 MG tablet ?Commonly known as: LIPITOR ?Take 1 tablet (80 mg total) by mouth daily. ?  ?carvedilol 6.25 MG tablet ?Commonly known as: COREG ?Take 1 tablet (6.25 mg total) by mouth 2 (two) times daily with a meal. ?  ?clopidogrel 75 MG tablet ?Commonly known as: PLAVIX ?Take 1 tablet (75 mg total) by mouth daily with breakfast. ?  ?feeding supplement Liqd ?Take 237 mLs by mouth 2 (two) times daily between meals. ?  ?levETIRAcetam 500 MG  tablet ?Commonly known as: KEPPRA ?Take 1 tablet (500 mg total) by mouth 2 (two) times daily. ?  ?multivitamin with minerals Tabs tablet ?Take 1 tablet by mouth daily. ?Start taking on: Sep 13, 2021 ?  ?nitroGLYCERIN 0.4 MG SL tablet ?Commonly known as: NITROSTAT ?Place 1 tablet (0.4 mg total) under the tongue every 5 (five) minutes as needed for chest pain. ?  ?pantoprazole 40 MG tablet ?Commonly known as: PROTONIX ?Take 1 tablet (40 mg total) by mouth daily. ?Start taking on: Sep 13, 2021 ?  ? ?  ? ? Contact information for after-discharge care   ? ? Destination   ? ? [facility] .   ?Service: Skilled Nursing ?Contact information: ?[address] ?[place] ?[phone] ? ?  ?  ? ?  ?  ? ?  ?  ? ?  ? ?No Known Allergies ? ?Consultations: ?PCCM  ?Neurology ? ? ?Procedures/Studies: ?DG Abd 1 View ? ?Result Date: 09/03/2021 ?CLINICAL DATA:  Line placement. EXAM: ABDOMEN - 1 VIEW  COMPARISON:  None. FINDINGS: Tip of the enteric tube is below the diaphragm in the stomach, the side port is in the region of the gastroesophageal junction. Advancement of 3-4 cm would lead to optimal placement. Chain sutures in the left upper quadrant and left abdomen. Air throughout nondilated small and large bowel. IMPRESSION: Tip of the enteric tube below the diaphragm in the stomach, side port in the region of the gastroesophageal junction. Advancement of 3-4 cm would lead to optimal placement. Electronically Signed   By: Keith Rake M.D.   On: 09/03/2021 14:45  ? ?CT HEAD WO CONTRAST (5MM) ? ?Result Date: 09/04/2021 ?CLINICAL DATA:  71 year old female is unresponsive. Intubated. Right PCA short segment occlusion or severe stenosis on CTA and CTP yesterday. EXAM: CT HEAD WITHOUT CONTRAST TECHNIQUE: Contiguous axial images were obtained from the base of the skull through the vertex without intravenous contrast. RADIATION DOSE REDUCTION: This exam was performed according to the  departmental dose-optimization program which includes automated exposure control, adjustment of the mA and/or kV according to patient size and/or use of iterative reconstruction technique. COMPARISON:  09/03/2021. Brain MRI 03

## 2021-09-12 NOTE — TOC Transition Note (Signed)
Transition of Care (TOC) - CM/SW Discharge Note ? ? ?Patient Details  ?Name: Susan Mclean ?MRN: [id number] ?Date of Birth: 1950-07-07 ? ?Transition of Care (TOC) CM/SW Contact:  ?Benard Halsted, LCSW ?Phone Number: ?09/12/2021, 2:23 PM ? ? ?Clinical Narrative:    ?Patient will DC to: [facility] [place] ?Anticipated DC date: 09/12/21 ?Family notified: Son ?Transport by: Corey Harold ? ? ?Per MD patient ready for [facility] to [facility]. RN to call report prior to discharge [phone]). RN, patient, patient's family, and facility notified of DC. Discharge Summary and FL2 sent to facility. DC packet on chart. Ambulance transport requested for patient.  ? ?CSW will sign off for now as social work intervention is no longer needed. Please consult [place] again if new needs arise. ? ? ? ? ?Final next level of care: [place] ?Barriers to Discharge: Barriers Resolved ? ? ?Patient Goals and CMS Choice ?Patient states their goals for this hospitalization and ongoing recovery are:: Rehab ?CMS Medicare.gov Compare Post Acute Care list provided to:: Patient ?Choice offered to / list presented to : Patient ? ?Discharge Placement ?  ?Existing PASRR number confirmed : 09/12/21          ?Patient chooses bed at: [facility] [place] ?Patient to be transferred to facility by: [facility] ?Name of family member notified: Son ?Patient and family notified of of transfer: 09/12/21 ? ?Discharge Plan and Services ?In-house Referral: Clinical Social Work ?  ?Post Acute Care Choice: [place]          ?  ?  ?  ?  ?  ?  ?  ?  ?  ?  ? ?Social Determinants of Health (SDOH) Interventions ?  ? ? ?Readmission Risk Interventions ?   ? View : No data to display.  ?  ?  ?  ? ? ? ? ? ?

## 2021-09-14 ENCOUNTER — Encounter (INDEPENDENT_AMBULATORY_CARE_PROVIDER_SITE_OTHER): Payer: 59

## 2021-09-14 ENCOUNTER — Ambulatory Visit (INDEPENDENT_AMBULATORY_CARE_PROVIDER_SITE_OTHER): Payer: 59 | Admitting: Nurse Practitioner

## 2021-09-21 ENCOUNTER — Telehealth: Payer: Self-pay | Admitting: Physician Assistant

## 2021-09-21 NOTE — Telephone Encounter (Signed)
?  [name] [facility] calling, she said, pt was discharged from rehab and pt gave this number to check if Susan Mclean will follow and approves PT and OT needs of the pt. ?

## 2021-09-21 NOTE — Telephone Encounter (Signed)
Spoke with [name] home health nurse and she reports that Dr. Idolina Primer was her primary care provider and needed PT & OT orders. Advised we are not her primary care provider and there is no cardiac indication for those services. She should reach out to her care team for further orders and recommendations. She was appreciative for the call with no further questions.  ?

## 2021-09-30 ENCOUNTER — Telehealth: Payer: Self-pay

## 2021-09-30 ENCOUNTER — Encounter: Payer: Self-pay | Admitting: Medical

## 2021-09-30 ENCOUNTER — Other Ambulatory Visit
Admission: RE | Admit: 2021-09-30 | Discharge: 2021-09-30 | Disposition: A | Discharge status: Home / Self Care | Payer: Medicare Other | Source: Ambulatory Visit | Attending: Medical | Admitting: Medical

## 2021-09-30 ENCOUNTER — Ambulatory Visit (INDEPENDENT_AMBULATORY_CARE_PROVIDER_SITE_OTHER): Payer: Medicare Other | Admitting: Medical

## 2021-09-30 VITALS — BP 116/74 | HR 98 | Ht 61.0 in | Wt 180.8 lb

## 2021-09-30 DIAGNOSIS — E782 Mixed hyperlipidemia: Secondary | ICD-10-CM

## 2021-09-30 DIAGNOSIS — I480 Paroxysmal atrial fibrillation: Secondary | ICD-10-CM | POA: Diagnosis not present

## 2021-09-30 DIAGNOSIS — Z7901 Long term (current) use of anticoagulants: Secondary | ICD-10-CM

## 2021-09-30 DIAGNOSIS — I10 Essential (primary) hypertension: Secondary | ICD-10-CM

## 2021-09-30 DIAGNOSIS — R0602 Shortness of breath: Secondary | ICD-10-CM

## 2021-09-30 DIAGNOSIS — I25118 Atherosclerotic heart disease of native coronary artery with other forms of angina pectoris: Secondary | ICD-10-CM | POA: Diagnosis not present

## 2021-09-30 DIAGNOSIS — N184 Chronic kidney disease, stage 4 (severe): Secondary | ICD-10-CM

## 2021-09-30 LAB — COMPREHENSIVE METABOLIC PANEL
ALT: 14 U/L (ref 0–44)
AST: 19 U/L (ref 15–41)
Albumin: 3.2 g/dL — ABNORMAL LOW (ref 3.5–5.0)
Alkaline Phosphatase: 194 U/L — ABNORMAL HIGH (ref 38–126)
Anion gap: 8 (ref 5–15)
BUN: 48 mg/dL — ABNORMAL HIGH (ref 8–23)
CO2: 15 mmol/L — ABNORMAL LOW (ref 22–32)
Calcium: 9.6 mg/dL (ref 8.9–10.3)
Chloride: 116 mmol/L — ABNORMAL HIGH (ref 98–111)
Creatinine, Ser: 2.6 mg/dL — ABNORMAL HIGH (ref 0.44–1.00)
GFR, Estimated: 19 mL/min — ABNORMAL LOW (ref >60)
Glucose, Bld: 122 mg/dL — ABNORMAL HIGH (ref 70–99)
Potassium: 4.2 mmol/L (ref 3.5–5.1)
Sodium: 139 mmol/L (ref 135–145)
Total Bilirubin: 0.6 mg/dL (ref 0.3–1.2)
Total Protein: 7.7 g/dL (ref 6.5–8.1)

## 2021-09-30 LAB — CBC
HCT: 34.6 % — ABNORMAL LOW (ref 36.0–46.0)
Hemoglobin: 11.1 g/dL — ABNORMAL LOW (ref 12.0–15.0)
MCH: 27.8 pg (ref 26.0–34.0)
MCHC: 32.1 g/dL (ref 30.0–36.0)
MCV: 86.5 fL (ref 80.0–100.0)
Platelets: 114 10*3/uL — ABNORMAL LOW (ref 150–400)
RBC: 4 MIL/uL (ref 3.87–5.11)
RDW: 14.5 % (ref 11.5–15.5)
WBC: 5.2 10*3/uL (ref 4.0–10.5)
nRBC: 0 % (ref 0.0–0.2)

## 2021-09-30 LAB — D-DIMER, QUANTITATIVE: D-Dimer, Quant: 0.81 ug/mL-FEU — ABNORMAL HIGH (ref 0.00–0.50)

## 2021-09-30 MED ORDER — CARVEDILOL 6.25 MG PO TABS: 6.2500 mg | ORAL_TABLET | Freq: Two times a day (BID) | ORAL | 3 refills | Status: AC

## 2021-09-30 MED ORDER — CLOPIDOGREL BISULFATE 75 MG PO TABS: 75.0000 mg | ORAL_TABLET | Freq: Every day | ORAL | 3 refills | Status: AC

## 2021-09-30 MED ORDER — APIXABAN 5 MG PO TABS: 5.0000 mg | ORAL_TABLET | Freq: Two times a day (BID) | ORAL | 3 refills | Status: AC

## 2021-09-30 MED ORDER — ATORVASTATIN CALCIUM 80 MG PO TABS: 80.0000 mg | ORAL_TABLET | Freq: Every day | ORAL | 3 refills | Status: AC

## 2021-09-30 MED ORDER — AMIODARONE HCL 200 MG PO TABS: ORAL_TABLET | ORAL | 3 refills | Status: AC

## 2021-09-30 NOTE — Telephone Encounter (Signed)
Called the patient to review lab results and Cadence Furth,PA recommendation. Lmtcb.

## 2021-09-30 NOTE — Progress Notes (Signed)
Cardiology Office Note:    Date:  09/30/2021   ID:  Susan Mclean, DOB 10/20/1950, MRN [id number]  PCP:  Pcp, No  [facility] Cardiologist:  Nelva Bush, MD  [place] Electrophysiologist:  None   Referring MD: No ref. provider found   Chief Complaint: hospital follow-up  History of Present Illness:    Susan Mclean is a 71 y.o. female with a hx of CAD remote LAD stenting and MI medically managed in the setting of prolonged hospitalization with intubation in [place] in 2021 with NSTEMI in 03/2021 status post PCI/DES to the distal LCx and OM3, HFpEF, PAF, CVA, HTN, HLD, DM2, CKD stage IV-V previously requiring temporary dialysis during extended hospitalization in [place] in 2021, anemia, vision disturbance of uncertain etiology, and obesity who presents for hospital follow-up as outlined below.   She was admitted to the hospital from 11/1 through 11/6 with an NSTEMI after developing nausea and emesis followed by chest tightness the following morning that transiently improved with Imdur, though returned with severe pain.  In the ED, she continued to have significant chest pain despite initiation of nitro drip.  EKG showed sinus rhythm with inferolateral T wave inversions and nonspecific ST-T changes.  High-sensitivity troponin peaked at greater than 24,000.  BNP 215.  CTA aorta showed no evidence of thoracic aortic aneurysm or dissection, PE, or evidence of acute cardiopulmonary disease.  CT head showed no acute intracranial abnormality.  LHC on 02/06/2021 demonstrated multivessel CAD including 60% mid LAD stenosis just distal to previously placed stent, multifocal codominant LCx disease of up to 80% in the distal vessel as well as thrombotic occlusion of a large OM 3 branch (culprit for patient's NSTEMI), and a 60% proximal/mid RCA disease as well as 90% stenosis involving an acute marginal branch.  Widely patent mid LAD stent.  She underwent PCI/DES to OM3 and the distal LCx.  Post-cath,  triple therapy was recommended for 1 week, after which time aspirin could be discontinued and the patient maintained on clopidogrel and apixaban for 12 months.  Echo postprocedure demonstrated an EF of 55%, moderate hypokinesis of the basal mid inferior lateral wall, mild LVH, grade 2 diastolic dysfunction, normal RV systolic function and ventricular cavity size, mild mitral regurgitation, and an estimated right atrial pressure of 3 mmHg.  Admission was also notable for acute on CKD which was felt to be secondary to ATN from contrast exposure as well as a downtrending hemoglobin to 9.4 improved to 10.5 at time of discharge.   Last seen 07/29/21 and had multiple issues. Due to her col and cyanotic left lower extremity with associated swelling and was recommended ER evaluation.   In the ER BP was severely elevated. Venous US showed left DVT. MRI Without acute stroke. She was on Eliquis, this was continued.   Admitted 4/29 -5/28 with AMS, seizures, and respiratory failure. CT head showed no acute stroke. EEG with no further seizures. She was seen by neurology who continued Keppra. She was discharged to a nursing home.   Today, the patient reports she is back at home from the nursing home. She lives with her son. She feels very tired since the hospitalization, feels it's getting worse. She feels very short of breath, even at rest. O2 is normal. HR is in the 90s. She can hardly walk around at home, mostly sedentary. No h/o smoking or OSA. May have some asthma. No LLE. EKG shows rate controlled afib. Has right sided chest pain, under the arm. No  pain with inspiration. Chest pain is pressure-like and constant. EKG shows Afib with rates in the 90s. Appears she is generally in SR.   Past Medical History:  Diagnosis Date   Atrial fibrillation ([place])    CHF (congestive heart failure) ([place])    DVT (deep venous thrombosis) ([facility])    Left lower extremity   HTN (hypertension)    Hypertension    MI (myocardial  infarction) ([place])     Past Surgical History:  Procedure Laterality Date   CORONARY STENT INTERVENTION N/A 03/09/2021   Procedure: CORONARY STENT INTERVENTION;  Surgeon: Nelva Bush, MD;  Location: [facility];  Service: Cardiovascular;  Laterality: N/A;   CORONARY STENT PLACEMENT     LEFT HEART CATH AND CORONARY ANGIOGRAPHY N/A 03/09/2021   Procedure: LEFT HEART CATH AND CORONARY ANGIOGRAPHY;  Surgeon: Nelva Bush, MD;  Location: [facility];  Service: Cardiovascular;  Laterality: N/A;    Current Medications: Current Meds  Medication Sig   amiodarone (PACERONE) 200 MG tablet Take 2 tablets (400 mg total) twice daily for one week, then take 1 tablet (200 mg total) twice daily for one week, then taken 1 tablet (200 mg total) once daily there after   Multiple Vitamin (MULTIVITAMIN WITH MINERALS) TABS tablet Take 1 tablet by mouth daily.   nitroGLYCERIN (NITROSTAT) 0.4 MG SL tablet Place 1 tablet (0.4 mg total) under the tongue every 5 (five) minutes as needed for chest pain.   [DISCONTINUED] apixaban (ELIQUIS) 5 MG TABS tablet Take 1 tablet (5 mg total) by mouth 2 (two) times daily.   [DISCONTINUED] atorvastatin (LIPITOR) 80 MG tablet Take 1 tablet (80 mg total) by mouth daily.   [DISCONTINUED] carvedilol (COREG) 6.25 MG tablet Take 1 tablet (6.25 mg total) by mouth 2 (two) times daily with a meal.   [DISCONTINUED] clopidogrel (PLAVIX) 75 MG tablet Take 1 tablet (75 mg total) by mouth daily with breakfast.   [DISCONTINUED] feeding supplement (ENSURE ENLIVE / ENSURE PLUS) LIQD Take 237 mLs by mouth 2 (two) times daily between meals.   [DISCONTINUED] levETIRAcetam (KEPPRA) 500 MG tablet Take 1 tablet (500 mg total) by mouth 2 (two) times daily.   [DISCONTINUED] pantoprazole (PROTONIX) 40 MG tablet Take 1 tablet (40 mg total) by mouth daily.     Allergies:   Patient has no known allergies.   Social History   Socioeconomic History   Marital status: Unknown     Spouse name: Not on file   Number of children: Not on file   Years of education: Not on file   Highest education level: Not on file  Occupational History   Not on file  Tobacco Use   Smoking status: Never   Smokeless tobacco: Never  Substance and Sexual Activity   Alcohol use: Not Currently   Drug use: Never   Sexual activity: Not on file  Other Topics Concern   Not on file  Social History Narrative   Not on file   Social Determinants of Health   Financial Resource Strain: Not on file  Food Insecurity: Not on file  Transportation Needs: Not on file  Physical Activity: Not on file  Stress: Not on file  Social Connections: Not on file     Family History: The patient's family history is not on file.  ROS:   Please see the history of present illness.     All other systems reviewed and are negative.  EKGs/Labs/Other Studies Reviewed:    The following studies were reviewed  today:    2D echo 03/09/2021: 1. Left ventricular ejection fraction, by estimation, is 55%. The left  ventricle has normal function. The left ventricle demonstrates regional  wall motion abnormalities (see scoring diagram/findings for description).  There is mild left ventricular  hypertrophy. Left ventricular diastolic parameters are consistent with  Grade II diastolic dysfunction (pseudonormalization). There is moderate  hypokinesis of the left ventricular, basal-mid inferolateral wall.   2. Right ventricular systolic function is normal. The right ventricular  size is normal.   3. The mitral valve is normal in structure. Mild mitral valve  regurgitation.   4. The aortic valve was not well visualized. Aortic valve regurgitation  is not visualized.   5. The inferior vena cava is normal in size with greater than 50%  respiratory variability, suggesting right atrial pressure of 3 mmHg. __________   LHC 03/09/2021: Conclusions: Multivessel coronary artery disease, as detailed below, including 60%  mid LAD stenosis just distal to previously placed stent, multifocal codominant LCx disease of up to 80% in the distal vessel as well as thrombotic occlusion of large OM3 branch (culprit for patient's NSTEMI), and 60% proximal/mid RCA disease as well as 90% stenosis involving acute marginal branch. Widely patent mid LAD stent. Normal left ventricular filling pressure. Successful PCI to OM 3 using Onyx Frontier 2.5 x 22 mm drug-eluting stent with 0% residual stenosis and TIMI-3 flow. Successful PCI to distal LCx using Onyx Frontier 2.75 x 12 mm drug-eluting stent with 0% residual stenosis and TIMI-3 flow.   Recommendations: Admit to ICU for post STEMI care. Start IV heparin 2 hours after TR band removal given history of paroxysmal atrial fibrillation.  Anticipate transitioning to apixaban as soon as tomorrow if no evidence of bleeding/vascular complications. Anticipate triple therapy with aspirin, clopidogrel, and apixaban x1 week, after which time aspirin can be can discontinued and clopidogrel and apixaban maintained for 12 months. Obtain echocardiogram. Gentle post catheterization hydration with careful monitoring of renal function given patient's CKD and high risk for contrast-induced nephropathy. Aggressive secondary prevention.    EKG:  EKG is  ordered today.  The ekg ordered today demonstrates Afib, 92bpm, LAD, nonspecific T wave changes  Recent Labs: 03/08/2021: B Natriuretic Peptide 215.2 09/04/2021: TSH 0.472 09/07/2021: Magnesium 1.8 09/30/2021: ALT 14; BUN 48; Creatinine, Ser 2.60; Hemoglobin 11.1; Platelets 114; Potassium 4.2; Sodium 139  Recent Lipid Panel    Component Value Date/Time   CHOL 105 03/10/2021 0458   TRIG 41 09/07/2021 0051   HDL 36 (L) 03/10/2021 0458   CHOLHDL 2.9 03/10/2021 0458   VLDL 9 03/10/2021 0458   LDLCALC 60 03/10/2021 0458      Physical Exam:    VS:  BP 116/74 (BP Location: Left Arm, Patient Position: Sitting)   Pulse 98   Ht 5\' 1"  (1.549 m)    Wt 180 lb 12.8 oz (82 kg)   SpO2 93%   BMI 34.16 kg/m     Wt Readings from Last 3 Encounters:  09/30/21 180 lb 12.8 oz (82 kg)  09/12/21 192 lb 10.9 oz (87.4 kg)  09/02/21 201 lb 1.3 oz (91.2 kg)     GEN:  Well nourished, well developed in no acute distress HEENT: Normal NECK: No JVD; No carotid bruits LYMPHATICS: No lymphadenopathy CARDIAC: RRR, no murmurs, rubs, gallops RESPIRATORY:  Clear to auscultation without rales, wheezing or rhonchi  ABDOMEN: Soft, non-tender, non-distended MUSCULOSKELETAL:  No edema; No deformity  SKIN: Warm and dry NEUROLOGIC:  Alert and oriented x 3 PSYCHIATRIC:  Normal affect   ASSESSMENT:    1. Shortness of breath   2. Chronic anticoagulation   3. Coronary artery disease involving native coronary artery of native heart with other form of angina pectoris (HCC)   4. Paroxysmal atrial fibrillation ([place])   5. Essential hypertension   6. Hyperlipidemia, mixed   7. Chronic kidney disease (CKD), stage IV (severe) ([facility])    PLAN:    In order of problems listed above:  Shortness of breath Patient reports SOB, worse since being discharged from the hospital . O2 is normal. EKG with rate controlled Afib in the 90s. No leg pain. Euvolemic on exam. H/o DVT, on Eliquis, she reports compliance with blood thinner. I will check D-dimer, BMET, CBC. I will check an echo and CXR Prior EKGs mainly show NSR, Afib may be driving breathing issues. I will restart amiodarone.   Atypical chest pain CAD  NSTEMI in 03/2021 with DES to OM3 and dLCx, report above . Patient reports left flank chest pain in the armpit area., not associated with exertion. Continue Plavix, Coreg, and statin therapy. I will defer further work-up at this time and can revisit at follow-up.   HFpEF She appears euvolemic on exam. Not currently on a diuretic. Does not appear volume overloaded on exam. Continue BB therapy.   PAF She is in rate controlled Afib, however in review appears she is  normally in NSR. She reports compliance with Eliquis. She is on coreg 6.25mg  BID for rate control, will not up titrate for reports of dizziness and lightheadedness. Appears she was previously on Amiodarone for Afib, but self-discontinued post-hospitalization in 03/2021. We will re-start amiodarone 400mg  BID for 1 week, 200mg  BID for 1 week, and 200mg  daily thereafter. We will see her back in 2 weeks to reassess symptoms.  HTN BP is good today. No changes today.  HLD LDL 60. Continue Lipitor.   CKD stage IV-V Scr 2.21, BUN 30 earlier this month. BMET today  Disposition: Follow up in 2 week(s) with APP    Signed, Zadaya Cuadra Arlyss Repress  09/30/2021 4:43 PM    [facility]

## 2021-09-30 NOTE — Patient Instructions (Addendum)
Medication Instructions:  Your physician recommends that you continue on your current medications as directed. Please refer to the Current Medication list given to you today.  *If you need a refill on your cardiac medications before your next appointment, please call your pharmacy*  Lab Work: TODAY: D-dimer, CBC, CMET  -  Please go to the [facility] in at the Registration Desk: 1st desk to the right, past the screening table -  Valet Parking is offered if needed -  No appointment needed -  Lab hours: Monday- Friday (7:30 am- 5:30 pm)   Testing/Procedures: Your physician has requested that you have an echocardiogram. Echocardiography is a painless test that uses sound waves to create images of your heart. It provides your doctor with information about the size and shape of your heart and how well your heart's chambers and valves are working. This procedure takes approximately one hour. There are no restrictions for this procedure.  A chest x-ray takes a picture of the organs and structures inside the chest, including the heart, lungs, and blood vessels. This test can show several things, including, whether the heart is enlarges; whether fluid is building up in the lungs; and whether pacemaker / defibrillator leads are still in place.  Follow-Up: At [facility], you and your health needs are our priority.  As part of our continuing mission to provide you with exceptional heart care, we have created designated Provider Care Teams.  These Care Teams include your primary Cardiologist (physician) and Advanced Practice Providers (APPs -  Physician Assistants and Nurse Practitioners) who all work together to provide you with the care you need, when you need it.   Your next appointment:   2 week(s)  The format for your next appointment:   In Person  Provider:   You will see one of the following Advanced Practice Providers on your designated Care Team:   Cadence  Kathlen Mody, [place]   Important Information About Sugar

## 2021-09-30 NOTE — Telephone Encounter (Signed)
-----   Message from [place], PA-C sent at 09/30/2021  1:18 PM EDT ----- D-dimer was positive, but if we age-adjust the number, blood clot is unlikely.  Kidney function is worsening, may be dehydrated. Recommend good water intake. She is not on nephrotoxic agents, already follows with nephology  CBC showed stable HGB

## 2021-10-04 NOTE — Telephone Encounter (Signed)
Patient made aware of lab results with verbalized understanding. Pt sts that she has been drinking more water. Pt did not have the ordered cxr [facility]. Pt sts that she did not realize that it was not performed.  Pt sts that she is still sob of breath at home. She also reports that her throat is still irritated from the probe placed during her recent hospitalization. She does not have a pulse ox and is unable to measure O2 sat. Pt sts that she does not have a pcp or pulmonologist. Adv the patient that I will fwd the update to [place], [place].

## 2021-10-06 NOTE — Telephone Encounter (Signed)
Attempted to call patient and inform her of Cadence Furth's recommendation as noted below. Phone just rang with no VM available.  I recommend she go get the CXR and find a PCP at least.

## 2021-10-10 NOTE — Telephone Encounter (Signed)
I called and spoke with the patient to advise her of Cadence [place], [place] recommendations.  Per the patient, she had lab work and a Chest x-ray on 09/30/21 at the [facility]. I advised the patient that her Chest x-ray order is pending as if she did not have this done.  Per the patient she recalled going to the lab by wheelchair and then to x-ray as she had to remove her bra that day.   I called radiology and spoke with staff there. They advised that an x-ray was not preformed on 09/30/21, last x-ray was 09/05/21-- no accession order for an x-ray on 09/30/21.  I called the patient back and advised her that there is no record of a chest x-ray being done on 09/30/21. I inquired if she could possibly come to the [facility] to have this done one day this week. She advised she will try to come, but has to arrange for a car service through her insurance company. She will work on getting this arranged.   She is still hoarse on the phone today. I inquired if she has a PCP, she confirms "Dr. Idolina Primer" is on my card. I advised the patient that we are not primary care, so she will need to establish with a PCP. I did give her the # for [facility] [phone]. She was agreeable to calling to get help with finding a PCP.  I advised her she may eventually need an ENT to look at her for her voice.   The patient voices understanding of all of the above and is agreeable. She was appreciative of the call today.

## 2021-10-31 ENCOUNTER — Other Ambulatory Visit: Payer: Medicare Other

## 2021-10-31 ENCOUNTER — Telehealth: Payer: Self-pay | Admitting: Medical

## 2021-11-10 ENCOUNTER — Ambulatory Visit: Payer: Medicare Other | Admitting: Medical

## 2021-11-10 ENCOUNTER — Encounter: Payer: Self-pay | Admitting: Medical

## 2021-11-10 NOTE — Progress Notes (Deleted)
Cardiology Office Note:    Date:  11/10/2021   ID:  Susan Mclean, DOB April 08, 1951, MRN [id number]  PCP:  Pcp, No  [facility] Cardiologist:  Nelva Bush, MD  [place] Electrophysiologist:  None   Referring MD: No ref. provider found   Chief Complaint: ***  History of Present Illness:    Susan Mclean is a 71 y.o. female with a hx of  CAD remote LAD stenting and MI medically managed in the setting of prolonged hospitalization with intubation in [place] in 2021 with NSTEMI in 03/2021 status post PCI/DES to the distal LCx and OM3, HFpEF, PAF, CVA, HTN, HLD, DM2, CKD stage IV-V previously requiring temporary dialysis during extended hospitalization in [place] in 2021, anemia, vision disturbance of uncertain etiology, and obesity who presents for hospital follow-up as outlined below.   She was admitted to the hospital from 11/1 through 11/6 with an NSTEMI after developing nausea and emesis followed by chest tightness the following morning that transiently improved with Imdur, though returned with severe pain.  In the ED, she continued to have significant chest pain despite initiation of nitro drip.  EKG showed sinus rhythm with inferolateral T wave inversions and nonspecific ST-T changes.  High-sensitivity troponin peaked at greater than 24,000.  BNP 215.  CTA aorta showed no evidence of thoracic aortic aneurysm or dissection, PE, or evidence of acute cardiopulmonary disease.  CT head showed no acute intracranial abnormality.  LHC on 02/06/2021 demonstrated multivessel CAD including 60% mid LAD stenosis just distal to previously placed stent, multifocal codominant LCx disease of up to 80% in the distal vessel as well as thrombotic occlusion of a large OM 3 branch (culprit for patient's NSTEMI), and a 60% proximal/mid RCA disease as well as 90% stenosis involving an acute marginal branch.  Widely patent mid LAD stent.  She underwent PCI/DES to OM3 and the distal LCx.  Post-cath, triple therapy  was recommended for 1 week, after which time aspirin could be discontinued and the patient maintained on clopidogrel and apixaban for 12 months.  Echo postprocedure demonstrated an EF of 55%, moderate hypokinesis of the basal mid inferior lateral wall, mild LVH, grade 2 diastolic dysfunction, normal RV systolic function and ventricular cavity size, mild mitral regurgitation, and an estimated right atrial pressure of 3 mmHg.  Admission was also notable for acute on CKD which was felt to be secondary to ATN from contrast exposure as well as a downtrending hemoglobin to 9.4 improved to 10.5 at time of discharge.   Seen 07/29/21 and had multiple issues. Due to her cold and cyanotic left lower extremity with associated swelling and was recommended ER evaluation. In the ER BP was severely elevated. Venous US showed left DVT. MRI Without acute stroke. She was on Eliquis, this was continued.    Admitted 4/29 -5/28 with AMS, seizures, and respiratory failure. CT head showed no acute stroke. EEG with no further seizures. She was seen by neurology who continued Keppra. She was discharged to a nursing home.   Last seen 09/30/21 and was back home with her son. She was in Afib with controlled rates. Amiodarone was restarted.   Today,   Past Medical History:  Diagnosis Date   Atrial fibrillation ([place])    CHF (congestive heart failure) ([place])    DVT (deep venous thrombosis) ([facility])    Left lower extremity   HTN (hypertension)    Hypertension    MI (myocardial infarction) [facility])     Past Surgical History:  Procedure  Laterality Date   CORONARY STENT INTERVENTION N/A 03/09/2021   Procedure: CORONARY STENT INTERVENTION;  Surgeon: Nelva Bush, MD;  Location: [facility];  Service: Cardiovascular;  Laterality: N/A;   CORONARY STENT PLACEMENT     LEFT HEART CATH AND CORONARY ANGIOGRAPHY N/A 03/09/2021   Procedure: LEFT HEART CATH AND CORONARY ANGIOGRAPHY;  Surgeon: Nelva Bush, MD;  Location: [facility];  Service: Cardiovascular;  Laterality: N/A;    Current Medications: No outpatient medications have been marked as taking for the 11/10/21 encounter (Appointment) with Kathlen Mody, Alfrieda Tarry H, PA-C.     Allergies:   Patient has no known allergies.   Social History   Socioeconomic History   Marital status: Unknown    Spouse name: Not on file   Number of children: Not on file   Years of education: Not on file   Highest education level: Not on file  Occupational History   Not on file  Tobacco Use   Smoking status: Never   Smokeless tobacco: Never  Substance and Sexual Activity   Alcohol use: Not Currently   Drug use: Never   Sexual activity: Not on file  Other Topics Concern   Not on file  Social History Narrative   Not on file   Social Determinants of Health   Financial Resource Strain: Not on file  Food Insecurity: Not on file  Transportation Needs: Not on file  Physical Activity: Not on file  Stress: Not on file  Social Connections: Not on file     Family History: The patient's family history is not on file.  ROS:   Please see the history of present illness.     All other systems reviewed and are negative.  EKGs/Labs/Other Studies Reviewed:    The following studies were reviewed today:    2D echo 03/09/2021: 1. Left ventricular ejection fraction, by estimation, is 55%. The left  ventricle has normal function. The left ventricle demonstrates regional  wall motion abnormalities (see scoring diagram/findings for description).  There is mild left ventricular  hypertrophy. Left ventricular diastolic parameters are consistent with  Grade II diastolic dysfunction (pseudonormalization). There is moderate  hypokinesis of the left ventricular, basal-mid inferolateral wall.   2. Right ventricular systolic function is normal. The right ventricular  size is normal.   3. The mitral valve is normal in structure. Mild mitral valve  regurgitation.   4. The aortic  valve was not well visualized. Aortic valve regurgitation  is not visualized.   5. The inferior vena cava is normal in size with greater than 50%  respiratory variability, suggesting right atrial pressure of 3 mmHg. __________   LHC 03/09/2021: Conclusions: Multivessel coronary artery disease, as detailed below, including 60% mid LAD stenosis just distal to previously placed stent, multifocal codominant LCx disease of up to 80% in the distal vessel as well as thrombotic occlusion of large OM3 branch (culprit for patient's NSTEMI), and 60% proximal/mid RCA disease as well as 90% stenosis involving acute marginal branch. Widely patent mid LAD stent. Normal left ventricular filling pressure. Successful PCI to OM 3 using Onyx Frontier 2.5 x 22 mm drug-eluting stent with 0% residual stenosis and TIMI-3 flow. Successful PCI to distal LCx using Onyx Frontier 2.75 x 12 mm drug-eluting stent with 0% residual stenosis and TIMI-3 flow.   Recommendations: Admit to ICU for post STEMI care. Start IV heparin 2 hours after TR band removal given history of paroxysmal atrial fibrillation.  Anticipate transitioning to apixaban as soon  as tomorrow if no evidence of bleeding/vascular complications. Anticipate triple therapy with aspirin, clopidogrel, and apixaban x1 week, after which time aspirin can be can discontinued and clopidogrel and apixaban maintained for 12 months. Obtain echocardiogram. Gentle post catheterization hydration with careful monitoring of renal function given patient's CKD and high risk for contrast-induced nephropathy. Aggressive secondary prevention.      EKG:  EKG is *** ordered today.  The ekg ordered today demonstrates ***  Recent Labs: 03/08/2021: B Natriuretic Peptide 215.2 09/04/2021: TSH 0.472 09/07/2021: Magnesium 1.8 09/30/2021: ALT 14; BUN 48; Creatinine, Ser 2.60; Hemoglobin 11.1; Platelets 114; Potassium 4.2; Sodium 139  Recent Lipid Panel    Component Value Date/Time    CHOL 105 03/10/2021 0458   TRIG 41 09/07/2021 0051   HDL 36 (L) 03/10/2021 0458   CHOLHDL 2.9 03/10/2021 0458   VLDL 9 03/10/2021 0458   LDLCALC 60 03/10/2021 0458     Risk Assessment/Calculations:   {Does this patient have ATRIAL FIBRILLATION?[phone]}   Physical Exam:    VS:  There were no vitals taken for this visit.    Wt Readings from Last 3 Encounters:  09/30/21 180 lb 12.8 oz (82 kg)  09/12/21 192 lb 10.9 oz (87.4 kg)  09/02/21 201 lb 1.3 oz (91.2 kg)     GEN: *** Well nourished, well developed in no acute distress HEENT: Normal NECK: No JVD; No carotid bruits LYMPHATICS: No lymphadenopathy CARDIAC: ***RRR, no murmurs, rubs, gallops RESPIRATORY:  Clear to auscultation without rales, wheezing or rhonchi  ABDOMEN: Soft, non-tender, non-distended MUSCULOSKELETAL:  No edema; No deformity  SKIN: Warm and dry NEUROLOGIC:  Alert and oriented x 3 PSYCHIATRIC:  Normal affect   ASSESSMENT:    No diagnosis found. PLAN:    In order of problems listed above:  Atypical chest pain CAD  HFpEF  PAF  HTN  HLD  CKD stage IV-V  Disposition: Follow up {follow up:15908} with ***   Shared Decision Making/Informed Consent   {Are you ordering a CV Procedure (e.g. stress test, cath, DCCV, TEE, etc)?   Press F2        :[id number]}    Signed, Lj Miyamoto Ninfa Meeker, PA-C  11/10/2021 10:26 AM    [facility]

## 2021-12-02 ENCOUNTER — Encounter: Payer: Self-pay | Admitting: Medical

## 2021-12-02 NOTE — Progress Notes (Signed)
Unable to contact patient to schedule echocardiogram, letter sent, order cancelled. 

## 2021-12-08 NOTE — Telephone Encounter (Signed)
Scheduled

## 2021-12-26 NOTE — Progress Notes (Deleted)
Cardiology Office Note:    Date:  12/26/2021   ID:  [name], DOB 1950/05/18, MRN [id number]  PCP:  Pcp, No  [facility] Cardiologist:  Nelva Bush, MD  [place] Electrophysiologist:  None   Referring MD: No ref. provider found   Chief Complaint: follow-up  History of Present Illness:    Susan Mclean is a 71 y.o. female with a hx of CAD remote LAD stenting and MI medically managed in the setting of prolonged hospitalization with intubation in [place] in 2021 with NSTEMI in 03/2021 status post PCI/DES to the distal LCx and OM3, HFpEF, PAF, CVA, HTN, HLD, DM2, CKD stage IV-V previously requiring temporary dialysis during extended hospitalization in [place] in 2021, anemia, vision disturbance of uncertain etiology, and obesity.  She was admitted to the hospital from 11/1 through 11/6 with an NSTEMI after developing nausea and emesis followed by chest tightness the following morning that transiently improved with Imdur, though returned with severe pain.  In the ED, she continued to have significant chest pain despite initiation of nitro drip.  EKG showed sinus rhythm with inferolateral T wave inversions and nonspecific ST-T changes.  High-sensitivity troponin peaked at greater than 24,000.  BNP 215.  CTA aorta showed no evidence of thoracic aortic aneurysm or dissection, PE, or evidence of acute cardiopulmonary disease.  CT head showed no acute intracranial abnormality.  LHC on 02/06/2021 demonstrated multivessel CAD including 60% mid LAD stenosis just distal to previously placed stent, multifocal codominant LCx disease of up to 80% in the distal vessel as well as thrombotic occlusion of a large OM 3 branch (culprit for patient's NSTEMI), and a 60% proximal/mid RCA disease as well as 90% stenosis involving an acute marginal branch.  Widely patent mid LAD stent.  She underwent PCI/DES to OM3 and the distal LCx.  Post-cath, triple therapy was recommended for 1 week, after which time  aspirin could be discontinued and the patient maintained on clopidogrel and apixaban for 12 months.  Echo postprocedure demonstrated an EF of 55%, moderate hypokinesis of the basal mid inferior lateral wall, mild LVH, grade 2 diastolic dysfunction, normal RV systolic function and ventricular cavity size, mild mitral regurgitation, and an estimated right atrial pressure of 3 mmHg.  Admission was also notable for acute on CKD which was felt to be secondary to ATN from contrast exposure as well as a downtrending hemoglobin to 9.4 improved to 10.5 at time of discharge.  Seen 07/29/21 and had multiple issues. Due to her cold and cyanotic left lower extremity with associated swelling and was recommended ER evaluation.    In the ER BP was severely elevated. Venous US showed left DVT. MRI Without acute stroke. She was on Eliquis, this was continued.    Admitted 4/29 -5/28 with AMS, seizures, and respiratory failure. CT head showed no acute stroke. EEG with no further seizures. She was seen by neurology who continued Keppra. She was discharged to a nursing home.   Lst seen 09/30/21 for hospital follow-up and reported SOB. EKG showed rate controlled Afib. Ddimer, echo and CXR were checked. Amiodarone was restarted. Adjusted Ddimer was negative. Echo and CXR were not performed.   Today,   Past Medical History:  Diagnosis Date   Atrial fibrillation ([place])    CHF (congestive heart failure) ([place])    DVT (deep venous thrombosis) ([facility])    Left lower extremity   HTN (hypertension)    Hypertension    MI (myocardial infarction) ([place])  Past Surgical History:  Procedure Laterality Date   CORONARY STENT INTERVENTION N/A 03/09/2021   Procedure: CORONARY STENT INTERVENTION;  Surgeon: Nelva Bush, MD;  Location: [facility];  Service: Cardiovascular;  Laterality: N/A;   CORONARY STENT PLACEMENT     LEFT HEART CATH AND CORONARY ANGIOGRAPHY N/A 03/09/2021   Procedure: LEFT HEART CATH AND CORONARY  ANGIOGRAPHY;  Surgeon: Nelva Bush, MD;  Location: [facility];  Service: Cardiovascular;  Laterality: N/A;    Current Medications: No outpatient medications have been marked as taking for the 12/27/21 encounter (Appointment) with Kathlen Mody, Cherell Colvin H, PA-C.     Allergies:   Patient has no known allergies.   Social History   Socioeconomic History   Marital status: Unknown    Spouse name: Not on file   Number of children: Not on file   Years of education: Not on file   Highest education level: Not on file  Occupational History   Not on file  Tobacco Use   Smoking status: Never   Smokeless tobacco: Never  Substance and Sexual Activity   Alcohol use: Not Currently   Drug use: Never   Sexual activity: Not on file  Other Topics Concern   Not on file  Social History Narrative   Not on file   Social Determinants of Health   Financial Resource Strain: Not on file  Food Insecurity: Not on file  Transportation Needs: Not on file  Physical Activity: Not on file  Stress: Not on file  Social Connections: Not on file     Family History: The patient's family history is not on file.  ROS:   Please see the history of present illness.     All other systems reviewed and are negative.  EKGs/Labs/Other Studies Reviewed:    The following studies were reviewed today:  2D echo 03/09/2021: 1. Left ventricular ejection fraction, by estimation, is 55%. The left  ventricle has normal function. The left ventricle demonstrates regional  wall motion abnormalities (see scoring diagram/findings for description).  There is mild left ventricular  hypertrophy. Left ventricular diastolic parameters are consistent with  Grade II diastolic dysfunction (pseudonormalization). There is moderate  hypokinesis of the left ventricular, basal-mid inferolateral wall.   2. Right ventricular systolic function is normal. The right ventricular  size is normal.   3. The mitral valve is normal in  structure. Mild mitral valve  regurgitation.   4. The aortic valve was not well visualized. Aortic valve regurgitation  is not visualized.   5. The inferior vena cava is normal in size with greater than 50%  respiratory variability, suggesting right atrial pressure of 3 mmHg. __________   LHC 03/09/2021: Conclusions: Multivessel coronary artery disease, as detailed below, including 60% mid LAD stenosis just distal to previously placed stent, multifocal codominant LCx disease of up to 80% in the distal vessel as well as thrombotic occlusion of large OM3 branch (culprit for patient's NSTEMI), and 60% proximal/mid RCA disease as well as 90% stenosis involving acute marginal branch. Widely patent mid LAD stent. Normal left ventricular filling pressure. Successful PCI to OM 3 using Onyx Frontier 2.5 x 22 mm drug-eluting stent with 0% residual stenosis and TIMI-3 flow. Successful PCI to distal LCx using Onyx Frontier 2.75 x 12 mm drug-eluting stent with 0% residual stenosis and TIMI-3 flow.   Recommendations: Admit to ICU for post STEMI care. Start IV heparin 2 hours after TR band removal given history of paroxysmal atrial fibrillation.  Anticipate transitioning to  apixaban as soon as tomorrow if no evidence of bleeding/vascular complications. Anticipate triple therapy with aspirin, clopidogrel, and apixaban x1 week, after which time aspirin can be can discontinued and clopidogrel and apixaban maintained for 12 months. Obtain echocardiogram. Gentle post catheterization hydration with careful monitoring of renal function given patient's CKD and high risk for contrast-induced nephropathy. Aggressive secondary prevention.    EKG:  EKG is *** ordered today.  The ekg ordered today demonstrates ***  Recent Labs: 03/08/2021: B Natriuretic Peptide 215.2 09/04/2021: TSH 0.472 09/07/2021: Magnesium 1.8 09/30/2021: ALT 14; BUN 48; Creatinine, Ser 2.60; Hemoglobin 11.1; Platelets 114; Potassium 4.2; Sodium  139  Recent Lipid Panel    Component Value Date/Time   CHOL 105 03/10/2021 0458   TRIG 41 09/07/2021 0051   HDL 36 (L) 03/10/2021 0458   CHOLHDL 2.9 03/10/2021 0458   VLDL 9 03/10/2021 0458   LDLCALC 60 03/10/2021 0458     Risk Assessment/Calculations:   {Does this patient have ATRIAL FIBRILLATION?:[phone]}   Physical Exam:    VS:  There were no vitals taken for this visit.    Wt Readings from Last 3 Encounters:  09/30/21 180 lb 12.8 oz (82 kg)  09/12/21 192 lb 10.9 oz (87.4 kg)  09/02/21 201 lb 1.3 oz (91.2 kg)     GEN: *** Well nourished, well developed in no acute distress HEENT: Normal NECK: No JVD; No carotid bruits LYMPHATICS: No lymphadenopathy CARDIAC: ***RRR, no murmurs, rubs, gallops RESPIRATORY:  Clear to auscultation without rales, wheezing or rhonchi  ABDOMEN: Soft, non-tender, non-distended MUSCULOSKELETAL:  No edema; No deformity  SKIN: Warm and dry NEUROLOGIC:  Alert and oriented x 3 PSYCHIATRIC:  Normal affect   ASSESSMENT:    No diagnosis found. PLAN:    In order of problems listed above: SOB  Atypical chest pain CAD  HFpEF  Paffib  HTN  HLD  CKD stage IV-V  Disposition: Follow up {follow up:15908} with ***   Shared Decision Making/Informed Consent   {Are you ordering a CV Procedure (e.g. stress test, cath, DCCV, TEE, etc)?   Press F2        :[id number]}    Signed, [place] Gasner Ninfa Meeker, PA-C  12/26/2021 9:01 PM    [facility]

## 2021-12-27 ENCOUNTER — Ambulatory Visit: Payer: Medicare Other | Admitting: Medical

## 2021-12-28 ENCOUNTER — Encounter: Payer: Self-pay | Admitting: Medical
